# Patient Record
Sex: Male | Born: 1950 | Race: Black or African American | Hispanic: No | Smoking: Current every day smoker
Health system: Southern US, Community
[De-identification: ages and names within clinical notes are randomized; demographics above are authoritative.]

## PROBLEM LIST (undated history)

## (undated) DIAGNOSIS — K219 Gastro-esophageal reflux disease without esophagitis: Secondary | ICD-10-CM

## (undated) DIAGNOSIS — M1990 Unspecified osteoarthritis, unspecified site: Secondary | ICD-10-CM

## (undated) DIAGNOSIS — D649 Anemia, unspecified: Secondary | ICD-10-CM

## (undated) DIAGNOSIS — I10 Essential (primary) hypertension: Secondary | ICD-10-CM

## (undated) DIAGNOSIS — I739 Peripheral vascular disease, unspecified: Secondary | ICD-10-CM

## (undated) DIAGNOSIS — R0600 Dyspnea, unspecified: Secondary | ICD-10-CM

## (undated) DIAGNOSIS — I429 Cardiomyopathy, unspecified: Secondary | ICD-10-CM

## (undated) DIAGNOSIS — M25559 Pain in unspecified hip: Secondary | ICD-10-CM

## (undated) DIAGNOSIS — N429 Disorder of prostate, unspecified: Secondary | ICD-10-CM

## (undated) HISTORY — PX: ABDOMINAL SURGERY: SHX537

## (undated) HISTORY — PX: LUNG SURGERY: SHX703

## (undated) HISTORY — PX: HERNIA REPAIR: SHX51

## (undated) HISTORY — PX: VASCULAR SURGERY: SHX849

---

## 2000-05-08 ENCOUNTER — Emergency Department (HOSPITAL_COMMUNITY): Admission: EM | Admit: 2000-05-08 | Discharge: 2000-05-08 | Payer: Self-pay | Admitting: *Deleted

## 2000-09-10 ENCOUNTER — Ambulatory Visit (HOSPITAL_COMMUNITY): Admission: RE | Admit: 2000-09-10 | Discharge: 2000-09-10 | Payer: Self-pay | Admitting: Family Medicine

## 2000-09-10 ENCOUNTER — Encounter: Payer: Self-pay | Admitting: Family Medicine

## 2001-02-26 ENCOUNTER — Ambulatory Visit (HOSPITAL_COMMUNITY): Admission: RE | Admit: 2001-02-26 | Discharge: 2001-02-26 | Payer: Self-pay | Admitting: Family Medicine

## 2001-02-26 ENCOUNTER — Encounter: Payer: Self-pay | Admitting: Family Medicine

## 2001-05-15 ENCOUNTER — Encounter: Payer: Self-pay | Admitting: Family Medicine

## 2001-05-15 ENCOUNTER — Ambulatory Visit (HOSPITAL_COMMUNITY): Admission: RE | Admit: 2001-05-15 | Discharge: 2001-05-15 | Payer: Self-pay | Admitting: Family Medicine

## 2001-10-23 ENCOUNTER — Emergency Department (HOSPITAL_COMMUNITY): Admission: EM | Admit: 2001-10-23 | Discharge: 2001-10-23 | Payer: Self-pay | Admitting: Internal Medicine

## 2002-10-22 ENCOUNTER — Encounter: Payer: Self-pay | Admitting: Family Medicine

## 2002-10-22 ENCOUNTER — Ambulatory Visit (HOSPITAL_COMMUNITY): Admission: RE | Admit: 2002-10-22 | Discharge: 2002-10-22 | Payer: Self-pay | Admitting: Family Medicine

## 2003-07-12 ENCOUNTER — Emergency Department (HOSPITAL_COMMUNITY): Admission: EM | Admit: 2003-07-12 | Discharge: 2003-07-12 | Payer: Self-pay | Admitting: Emergency Medicine

## 2004-01-20 ENCOUNTER — Ambulatory Visit (HOSPITAL_COMMUNITY): Admission: RE | Admit: 2004-01-20 | Discharge: 2004-01-20 | Payer: Self-pay | Admitting: Pulmonary Disease

## 2004-08-29 ENCOUNTER — Ambulatory Visit (HOSPITAL_COMMUNITY): Admission: RE | Admit: 2004-08-29 | Discharge: 2004-08-29 | Payer: Self-pay | Admitting: General Surgery

## 2005-01-25 ENCOUNTER — Encounter (INDEPENDENT_AMBULATORY_CARE_PROVIDER_SITE_OTHER): Payer: Self-pay | Admitting: Family Medicine

## 2005-01-30 ENCOUNTER — Ambulatory Visit (HOSPITAL_COMMUNITY): Admission: RE | Admit: 2005-01-30 | Discharge: 2005-01-30 | Payer: Self-pay | Admitting: Family Medicine

## 2005-12-20 ENCOUNTER — Ambulatory Visit: Payer: Self-pay | Admitting: Family Medicine

## 2006-01-08 ENCOUNTER — Encounter: Payer: Self-pay | Admitting: Family Medicine

## 2006-01-08 DIAGNOSIS — B1710 Acute hepatitis C without hepatic coma: Secondary | ICD-10-CM | POA: Insufficient documentation

## 2006-01-08 DIAGNOSIS — M1990 Unspecified osteoarthritis, unspecified site: Secondary | ICD-10-CM | POA: Insufficient documentation

## 2006-01-08 DIAGNOSIS — K219 Gastro-esophageal reflux disease without esophagitis: Secondary | ICD-10-CM | POA: Insufficient documentation

## 2006-01-08 DIAGNOSIS — G43909 Migraine, unspecified, not intractable, without status migrainosus: Secondary | ICD-10-CM | POA: Insufficient documentation

## 2006-01-08 DIAGNOSIS — N400 Enlarged prostate without lower urinary tract symptoms: Secondary | ICD-10-CM | POA: Insufficient documentation

## 2006-01-08 DIAGNOSIS — F17200 Nicotine dependence, unspecified, uncomplicated: Secondary | ICD-10-CM | POA: Insufficient documentation

## 2006-01-08 DIAGNOSIS — J309 Allergic rhinitis, unspecified: Secondary | ICD-10-CM | POA: Insufficient documentation

## 2006-01-25 ENCOUNTER — Ambulatory Visit: Payer: Self-pay | Admitting: Family Medicine

## 2006-01-28 ENCOUNTER — Encounter (INDEPENDENT_AMBULATORY_CARE_PROVIDER_SITE_OTHER): Payer: Self-pay | Admitting: Family Medicine

## 2006-01-28 ENCOUNTER — Ambulatory Visit (HOSPITAL_COMMUNITY): Admission: RE | Admit: 2006-01-28 | Discharge: 2006-01-28 | Payer: Self-pay | Admitting: Family Medicine

## 2006-01-28 ENCOUNTER — Encounter: Payer: Self-pay | Admitting: Orthopedic Surgery

## 2006-01-28 LAB — CONVERTED CEMR LAB
Alkaline Phosphatase: 63 units/L (ref 39–117)
Basophils Absolute: 0 10*3/uL (ref 0.0–0.1)
Basophils Relative: 0 % (ref 0–1)
CO2: 28 meq/L (ref 19–32)
Chloride: 101 meq/L (ref 96–112)
Cholesterol: 137 mg/dL (ref 0–200)
Creatinine, Ser: 1.1 mg/dL (ref 0.40–1.50)
Eosinophils Absolute: 0.1 10*3/uL (ref 0.0–0.7)
HDL: 65 mg/dL (ref >39)
LDL Cholesterol: 59 mg/dL (ref 0–99)
MCV: 85.4 fL (ref 78.0–100.0)
Monocytes Absolute: 1.1 10*3/uL — ABNORMAL HIGH (ref 0.2–0.7)
Platelets: 345 10*3/uL (ref 150–400)
RBC: 4.92 M/uL (ref 4.22–5.81)
Sodium: 140 meq/L (ref 135–145)
TSH: 0.315 microintl units/mL — ABNORMAL LOW (ref 0.350–5.50)
Total Protein: 7.6 g/dL (ref 6.0–8.3)
Triglycerides: 63 mg/dL (ref <150)
WBC: 6.9 10*3/uL (ref 4.0–10.5)

## 2006-01-30 ENCOUNTER — Encounter (INDEPENDENT_AMBULATORY_CARE_PROVIDER_SITE_OTHER): Payer: Self-pay | Admitting: Family Medicine

## 2006-01-30 ENCOUNTER — Ambulatory Visit (HOSPITAL_COMMUNITY): Admission: RE | Admit: 2006-01-30 | Discharge: 2006-01-30 | Payer: Self-pay | Admitting: Family Medicine

## 2006-02-04 ENCOUNTER — Encounter (INDEPENDENT_AMBULATORY_CARE_PROVIDER_SITE_OTHER): Payer: Self-pay | Admitting: Family Medicine

## 2006-02-15 ENCOUNTER — Encounter (INDEPENDENT_AMBULATORY_CARE_PROVIDER_SITE_OTHER): Payer: Self-pay | Admitting: Family Medicine

## 2006-02-18 ENCOUNTER — Ambulatory Visit: Payer: Self-pay | Admitting: Family Medicine

## 2006-02-18 DIAGNOSIS — M1610 Unilateral primary osteoarthritis, unspecified hip: Secondary | ICD-10-CM | POA: Insufficient documentation

## 2006-02-18 DIAGNOSIS — M169 Osteoarthritis of hip, unspecified: Secondary | ICD-10-CM | POA: Insufficient documentation

## 2006-02-19 ENCOUNTER — Encounter (INDEPENDENT_AMBULATORY_CARE_PROVIDER_SITE_OTHER): Payer: Self-pay | Admitting: Family Medicine

## 2006-02-27 ENCOUNTER — Telehealth (INDEPENDENT_AMBULATORY_CARE_PROVIDER_SITE_OTHER): Payer: Self-pay | Admitting: Family Medicine

## 2006-03-18 ENCOUNTER — Ambulatory Visit: Payer: Self-pay | Admitting: Orthopedic Surgery

## 2006-04-26 ENCOUNTER — Encounter (INDEPENDENT_AMBULATORY_CARE_PROVIDER_SITE_OTHER): Payer: Self-pay | Admitting: Family Medicine

## 2006-05-06 ENCOUNTER — Ambulatory Visit: Payer: Self-pay | Admitting: Family Medicine

## 2006-05-07 ENCOUNTER — Encounter (INDEPENDENT_AMBULATORY_CARE_PROVIDER_SITE_OTHER): Payer: Self-pay | Admitting: Family Medicine

## 2006-05-07 LAB — CONVERTED CEMR LAB
Amphetamine Screen, Ur: NEGATIVE
Benzodiazepines.: NEGATIVE
Cocaine Metabolites: NEGATIVE
Creatinine,U: 178.7 mg/dL
Opiate Screen, Urine: NEGATIVE
Phencyclidine (PCP): NEGATIVE
Propoxyphene: NEGATIVE
T3, Free: 2.9 pg/mL (ref 2.3–4.2)
TSH: 0.395 microintl units/mL (ref 0.350–5.50)

## 2006-05-20 ENCOUNTER — Ambulatory Visit: Payer: Self-pay | Admitting: Family Medicine

## 2006-05-21 ENCOUNTER — Encounter (INDEPENDENT_AMBULATORY_CARE_PROVIDER_SITE_OTHER): Payer: Self-pay | Admitting: Family Medicine

## 2006-06-11 ENCOUNTER — Ambulatory Visit: Payer: Self-pay | Admitting: Family Medicine

## 2006-07-11 ENCOUNTER — Encounter (INDEPENDENT_AMBULATORY_CARE_PROVIDER_SITE_OTHER): Payer: Self-pay | Admitting: Family Medicine

## 2006-07-12 ENCOUNTER — Ambulatory Visit: Payer: Self-pay | Admitting: Family Medicine

## 2006-07-12 ENCOUNTER — Telehealth (INDEPENDENT_AMBULATORY_CARE_PROVIDER_SITE_OTHER): Payer: Self-pay | Admitting: *Deleted

## 2006-07-13 ENCOUNTER — Encounter (INDEPENDENT_AMBULATORY_CARE_PROVIDER_SITE_OTHER): Payer: Self-pay | Admitting: Family Medicine

## 2006-07-14 LAB — CONVERTED CEMR LAB
Amphetamine Screen, Ur: NEGATIVE
Barbiturate Quant, Ur: NEGATIVE
Benzodiazepines.: NEGATIVE
Methadone: NEGATIVE

## 2006-07-15 ENCOUNTER — Telehealth (INDEPENDENT_AMBULATORY_CARE_PROVIDER_SITE_OTHER): Payer: Self-pay | Admitting: *Deleted

## 2006-08-16 ENCOUNTER — Encounter: Payer: Self-pay | Admitting: Internal Medicine

## 2006-08-16 ENCOUNTER — Ambulatory Visit: Payer: Self-pay | Admitting: Internal Medicine

## 2006-08-16 ENCOUNTER — Ambulatory Visit (HOSPITAL_COMMUNITY): Admission: RE | Admit: 2006-08-16 | Discharge: 2006-08-16 | Payer: Self-pay | Admitting: Internal Medicine

## 2006-10-01 ENCOUNTER — Ambulatory Visit (HOSPITAL_COMMUNITY): Admission: RE | Admit: 2006-10-01 | Discharge: 2006-10-01 | Payer: Self-pay | Admitting: Pulmonary Disease

## 2006-10-09 ENCOUNTER — Ambulatory Visit: Payer: Self-pay | Admitting: Family Medicine

## 2006-11-05 ENCOUNTER — Telehealth (INDEPENDENT_AMBULATORY_CARE_PROVIDER_SITE_OTHER): Payer: Self-pay | Admitting: Family Medicine

## 2006-11-07 ENCOUNTER — Ambulatory Visit: Payer: Self-pay | Admitting: Family Medicine

## 2006-11-08 ENCOUNTER — Encounter (INDEPENDENT_AMBULATORY_CARE_PROVIDER_SITE_OTHER): Payer: Self-pay | Admitting: Family Medicine

## 2006-11-14 ENCOUNTER — Telehealth (INDEPENDENT_AMBULATORY_CARE_PROVIDER_SITE_OTHER): Payer: Self-pay | Admitting: *Deleted

## 2007-03-04 ENCOUNTER — Ambulatory Visit: Payer: Self-pay | Admitting: Family Medicine

## 2007-03-04 ENCOUNTER — Telehealth (INDEPENDENT_AMBULATORY_CARE_PROVIDER_SITE_OTHER): Payer: Self-pay | Admitting: *Deleted

## 2007-03-04 LAB — CONVERTED CEMR LAB

## 2007-03-05 ENCOUNTER — Encounter (INDEPENDENT_AMBULATORY_CARE_PROVIDER_SITE_OTHER): Payer: Self-pay | Admitting: Family Medicine

## 2007-03-10 ENCOUNTER — Telehealth (INDEPENDENT_AMBULATORY_CARE_PROVIDER_SITE_OTHER): Payer: Self-pay | Admitting: *Deleted

## 2007-03-10 LAB — CONVERTED CEMR LAB
ALT: 16 units/L (ref 0–53)
BUN: 12 mg/dL (ref 6–23)
Chloride: 102 meq/L (ref 96–112)
Creatinine, Ser: 0.82 mg/dL (ref 0.40–1.50)
Glucose, Bld: 83 mg/dL (ref 70–99)
HDL: 64 mg/dL (ref >39)
LDL Cholesterol: 72 mg/dL (ref 0–99)
TSH: 0.154 microintl units/mL — ABNORMAL LOW (ref 0.350–5.50)
Total Bilirubin: 0.5 mg/dL (ref 0.3–1.2)
Total Protein: 7.5 g/dL (ref 6.0–8.3)

## 2007-03-11 ENCOUNTER — Encounter (INDEPENDENT_AMBULATORY_CARE_PROVIDER_SITE_OTHER): Payer: Self-pay | Admitting: Family Medicine

## 2007-03-14 ENCOUNTER — Ambulatory Visit: Payer: Self-pay | Admitting: Family Medicine

## 2007-03-14 ENCOUNTER — Telehealth (INDEPENDENT_AMBULATORY_CARE_PROVIDER_SITE_OTHER): Payer: Self-pay | Admitting: *Deleted

## 2007-03-15 ENCOUNTER — Encounter (INDEPENDENT_AMBULATORY_CARE_PROVIDER_SITE_OTHER): Payer: Self-pay | Admitting: Family Medicine

## 2007-03-17 ENCOUNTER — Telehealth (INDEPENDENT_AMBULATORY_CARE_PROVIDER_SITE_OTHER): Payer: Self-pay | Admitting: *Deleted

## 2007-03-18 ENCOUNTER — Ambulatory Visit (HOSPITAL_COMMUNITY): Admission: RE | Admit: 2007-03-18 | Discharge: 2007-03-18 | Payer: Self-pay | Admitting: Family Medicine

## 2007-03-20 ENCOUNTER — Encounter (INDEPENDENT_AMBULATORY_CARE_PROVIDER_SITE_OTHER): Payer: Self-pay | Admitting: Family Medicine

## 2007-03-20 ENCOUNTER — Telehealth (INDEPENDENT_AMBULATORY_CARE_PROVIDER_SITE_OTHER): Payer: Self-pay | Admitting: *Deleted

## 2007-03-21 ENCOUNTER — Telehealth (INDEPENDENT_AMBULATORY_CARE_PROVIDER_SITE_OTHER): Payer: Self-pay | Admitting: *Deleted

## 2007-03-21 ENCOUNTER — Encounter (INDEPENDENT_AMBULATORY_CARE_PROVIDER_SITE_OTHER): Payer: Self-pay | Admitting: Family Medicine

## 2007-04-01 ENCOUNTER — Ambulatory Visit: Payer: Self-pay | Admitting: Family Medicine

## 2007-04-02 ENCOUNTER — Ambulatory Visit (HOSPITAL_COMMUNITY): Admission: RE | Admit: 2007-04-02 | Discharge: 2007-04-02 | Payer: Self-pay | Admitting: Family Medicine

## 2007-04-02 ENCOUNTER — Telehealth (INDEPENDENT_AMBULATORY_CARE_PROVIDER_SITE_OTHER): Payer: Self-pay | Admitting: Family Medicine

## 2007-04-02 ENCOUNTER — Encounter (INDEPENDENT_AMBULATORY_CARE_PROVIDER_SITE_OTHER): Payer: Self-pay | Admitting: Family Medicine

## 2007-04-02 ENCOUNTER — Encounter: Payer: Self-pay | Admitting: Orthopedic Surgery

## 2007-04-09 ENCOUNTER — Encounter (HOSPITAL_COMMUNITY): Admission: RE | Admit: 2007-04-09 | Discharge: 2007-05-09 | Payer: Self-pay | Admitting: Endocrinology

## 2007-04-10 ENCOUNTER — Encounter (INDEPENDENT_AMBULATORY_CARE_PROVIDER_SITE_OTHER): Payer: Self-pay | Admitting: Family Medicine

## 2007-04-17 ENCOUNTER — Encounter (INDEPENDENT_AMBULATORY_CARE_PROVIDER_SITE_OTHER): Payer: Self-pay | Admitting: Family Medicine

## 2007-04-23 ENCOUNTER — Ambulatory Visit: Payer: Self-pay | Admitting: Orthopedic Surgery

## 2007-06-03 ENCOUNTER — Telehealth (INDEPENDENT_AMBULATORY_CARE_PROVIDER_SITE_OTHER): Payer: Self-pay | Admitting: *Deleted

## 2007-06-04 ENCOUNTER — Telehealth (INDEPENDENT_AMBULATORY_CARE_PROVIDER_SITE_OTHER): Payer: Self-pay | Admitting: *Deleted

## 2007-06-19 ENCOUNTER — Ambulatory Visit: Payer: Self-pay | Admitting: Family Medicine

## 2007-06-19 DIAGNOSIS — R636 Underweight: Secondary | ICD-10-CM | POA: Insufficient documentation

## 2007-06-19 DIAGNOSIS — A150 Tuberculosis of lung: Secondary | ICD-10-CM | POA: Insufficient documentation

## 2007-06-20 ENCOUNTER — Encounter (INDEPENDENT_AMBULATORY_CARE_PROVIDER_SITE_OTHER): Payer: Self-pay | Admitting: Family Medicine

## 2007-06-23 LAB — CONVERTED CEMR LAB
HCV Ab: REACTIVE — AB
Hepatitis B Surface Ag: NEGATIVE

## 2007-07-02 ENCOUNTER — Ambulatory Visit (HOSPITAL_COMMUNITY): Admission: RE | Admit: 2007-07-02 | Discharge: 2007-07-02 | Payer: Self-pay | Admitting: Pulmonary Disease

## 2007-07-17 ENCOUNTER — Ambulatory Visit: Payer: Self-pay | Admitting: Family Medicine

## 2007-07-24 ENCOUNTER — Telehealth (INDEPENDENT_AMBULATORY_CARE_PROVIDER_SITE_OTHER): Payer: Self-pay | Admitting: *Deleted

## 2007-09-09 ENCOUNTER — Encounter: Admission: RE | Admit: 2007-09-09 | Discharge: 2007-09-09 | Payer: Self-pay | Admitting: Orthopedic Surgery

## 2007-09-25 ENCOUNTER — Ambulatory Visit: Payer: Self-pay | Admitting: Family Medicine

## 2007-09-25 DIAGNOSIS — F528 Other sexual dysfunction not due to a substance or known physiological condition: Secondary | ICD-10-CM | POA: Insufficient documentation

## 2007-10-23 ENCOUNTER — Ambulatory Visit: Payer: Self-pay | Admitting: Family Medicine

## 2007-10-23 LAB — CONVERTED CEMR LAB
Bilirubin Urine: NEGATIVE
Ketones, urine, test strip: NEGATIVE
Protein, U semiquant: NEGATIVE
Specific Gravity, Urine: 1.02
Urobilinogen, UA: 0.2
pH: 6

## 2007-10-24 ENCOUNTER — Encounter (INDEPENDENT_AMBULATORY_CARE_PROVIDER_SITE_OTHER): Payer: Self-pay | Admitting: Family Medicine

## 2007-10-24 LAB — CONVERTED CEMR LAB: GC Probe Amp, Urine: NEGATIVE

## 2007-11-26 ENCOUNTER — Telehealth (INDEPENDENT_AMBULATORY_CARE_PROVIDER_SITE_OTHER): Payer: Self-pay | Admitting: *Deleted

## 2008-01-29 ENCOUNTER — Telehealth (INDEPENDENT_AMBULATORY_CARE_PROVIDER_SITE_OTHER): Payer: Self-pay | Admitting: Family Medicine

## 2008-02-05 ENCOUNTER — Ambulatory Visit: Payer: Self-pay | Admitting: Family Medicine

## 2008-02-06 ENCOUNTER — Encounter (INDEPENDENT_AMBULATORY_CARE_PROVIDER_SITE_OTHER): Payer: Self-pay | Admitting: Family Medicine

## 2008-02-08 LAB — CONVERTED CEMR LAB
Benzodiazepines.: NEGATIVE
Cocaine Metabolites: NEGATIVE
Marijuana Metabolite: NEGATIVE
Opiate Screen, Urine: NEGATIVE
Phencyclidine (PCP): NEGATIVE

## 2008-03-12 ENCOUNTER — Telehealth (INDEPENDENT_AMBULATORY_CARE_PROVIDER_SITE_OTHER): Payer: Self-pay | Admitting: Family Medicine

## 2008-03-29 ENCOUNTER — Encounter (INDEPENDENT_AMBULATORY_CARE_PROVIDER_SITE_OTHER): Payer: Self-pay | Admitting: Family Medicine

## 2008-04-13 ENCOUNTER — Ambulatory Visit: Payer: Self-pay | Admitting: Family Medicine

## 2008-05-11 ENCOUNTER — Ambulatory Visit: Payer: Self-pay | Admitting: Family Medicine

## 2008-05-12 ENCOUNTER — Encounter (INDEPENDENT_AMBULATORY_CARE_PROVIDER_SITE_OTHER): Payer: Self-pay | Admitting: Family Medicine

## 2008-05-13 LAB — CONVERTED CEMR LAB
AST: 28 units/L (ref 0–37)
BUN: 9 mg/dL (ref 6–23)
Basophils Absolute: 0.1 10*3/uL (ref 0.0–0.1)
Basophils Relative: 1 % (ref 0–1)
Calcium: 9.6 mg/dL (ref 8.4–10.5)
Chloride: 108 meq/L (ref 96–112)
Eosinophils Absolute: 0.2 10*3/uL (ref 0.0–0.7)
Hemoglobin: 12.3 g/dL — ABNORMAL LOW (ref 13.0–17.0)
Lymphs Abs: 2.3 10*3/uL (ref 0.7–4.0)
MCHC: 32.4 g/dL (ref 30.0–36.0)
MCV: 85 fL (ref 78.0–100.0)
Neutro Abs: 4.5 10*3/uL (ref 1.7–7.7)
Neutrophils Relative %: 58 % (ref 43–77)
Potassium: 4.4 meq/L (ref 3.5–5.3)
RDW: 14.2 % (ref 11.5–15.5)
Total Bilirubin: 0.4 mg/dL (ref 0.3–1.2)
WBC: 7.7 10*3/uL (ref 4.0–10.5)

## 2008-05-18 ENCOUNTER — Telehealth (INDEPENDENT_AMBULATORY_CARE_PROVIDER_SITE_OTHER): Payer: Self-pay | Admitting: *Deleted

## 2008-05-18 DIAGNOSIS — D649 Anemia, unspecified: Secondary | ICD-10-CM | POA: Insufficient documentation

## 2008-05-20 ENCOUNTER — Encounter (INDEPENDENT_AMBULATORY_CARE_PROVIDER_SITE_OTHER): Payer: Self-pay | Admitting: Family Medicine

## 2008-05-21 LAB — CONVERTED CEMR LAB
Iron: 135 ug/dL (ref 42–165)
Retic Ct Pct: 0.8 % (ref 0.4–3.1)
UIBC: 170 ug/dL

## 2008-05-24 ENCOUNTER — Encounter (INDEPENDENT_AMBULATORY_CARE_PROVIDER_SITE_OTHER): Payer: Self-pay | Admitting: Family Medicine

## 2008-06-15 ENCOUNTER — Telehealth (INDEPENDENT_AMBULATORY_CARE_PROVIDER_SITE_OTHER): Payer: Self-pay | Admitting: Family Medicine

## 2008-07-26 ENCOUNTER — Ambulatory Visit: Payer: Self-pay | Admitting: Family Medicine

## 2008-07-27 ENCOUNTER — Encounter (INDEPENDENT_AMBULATORY_CARE_PROVIDER_SITE_OTHER): Payer: Self-pay | Admitting: *Deleted

## 2008-07-27 LAB — CONVERTED CEMR LAB
Chloride: 109 meq/L (ref 96–112)
Eosinophils Relative: 5 % (ref 0–5)
Glucose, Bld: 108 mg/dL — ABNORMAL HIGH (ref 70–99)
HCT: 37.7 % — ABNORMAL LOW (ref 39.0–52.0)
MCV: 85.7 fL (ref 78.0–100.0)
Monocytes Relative: 8 % (ref 3–12)
Neutro Abs: 3.5 10*3/uL (ref 1.7–7.7)
Platelets: 332 10*3/uL (ref 150–400)
RBC: 4.4 M/uL (ref 4.22–5.81)
RDW: 13.8 % (ref 11.5–15.5)
Sodium: 142 meq/L (ref 135–145)

## 2008-08-26 ENCOUNTER — Ambulatory Visit: Payer: Self-pay | Admitting: Family Medicine

## 2008-09-20 ENCOUNTER — Encounter (INDEPENDENT_AMBULATORY_CARE_PROVIDER_SITE_OTHER): Payer: Self-pay | Admitting: Family Medicine

## 2008-09-20 ENCOUNTER — Telehealth (INDEPENDENT_AMBULATORY_CARE_PROVIDER_SITE_OTHER): Payer: Self-pay | Admitting: *Deleted

## 2009-03-14 ENCOUNTER — Emergency Department (HOSPITAL_COMMUNITY): Admission: EM | Admit: 2009-03-14 | Discharge: 2009-03-14 | Payer: Self-pay | Admitting: Emergency Medicine

## 2009-04-04 ENCOUNTER — Ambulatory Visit (HOSPITAL_COMMUNITY): Admission: RE | Admit: 2009-04-04 | Discharge: 2009-04-04 | Payer: Self-pay | Admitting: Internal Medicine

## 2009-09-02 ENCOUNTER — Ambulatory Visit (HOSPITAL_COMMUNITY): Admission: RE | Admit: 2009-09-02 | Discharge: 2009-09-02 | Payer: Self-pay | Admitting: Internal Medicine

## 2010-03-15 ENCOUNTER — Other Ambulatory Visit: Payer: Self-pay | Admitting: General Surgery

## 2010-03-15 ENCOUNTER — Encounter (HOSPITAL_COMMUNITY): Payer: Medicaid Other

## 2010-03-15 LAB — DIFFERENTIAL
Basophils Absolute: 0 10*3/uL (ref 0.0–0.1)
Neutro Abs: 4.1 10*3/uL (ref 1.7–7.7)

## 2010-03-15 LAB — BASIC METABOLIC PANEL
CO2: 25 mEq/L (ref 19–32)
Chloride: 108 mEq/L (ref 96–112)
GFR calc non Af Amer: >60 mL/min (ref >60)
Potassium: 4.6 mEq/L (ref 3.5–5.1)
Sodium: 141 mEq/L (ref 135–145)

## 2010-03-15 LAB — SURGICAL PCR SCREEN: Staphylococcus aureus: POSITIVE — AB

## 2010-03-15 LAB — CBC
HCT: 37.9 % — ABNORMAL LOW (ref 39.0–52.0)
Hemoglobin: 12.4 g/dL — ABNORMAL LOW (ref 13.0–17.0)
MCV: 83.7 fL (ref 78.0–100.0)
RBC: 4.53 MIL/uL (ref 4.22–5.81)

## 2010-03-21 ENCOUNTER — Ambulatory Visit (HOSPITAL_COMMUNITY)
Admission: RE | Admit: 2010-03-21 | Discharge: 2010-03-21 | Disposition: A | Discharge status: Home / Self Care | Payer: Medicaid Other | Source: Ambulatory Visit | Attending: General Surgery | Admitting: General Surgery

## 2010-03-21 ENCOUNTER — Other Ambulatory Visit: Payer: Self-pay | Admitting: General Surgery

## 2010-03-21 DIAGNOSIS — Z01812 Encounter for preprocedural laboratory examination: Secondary | ICD-10-CM | POA: Insufficient documentation

## 2010-03-21 DIAGNOSIS — N471 Phimosis: Secondary | ICD-10-CM | POA: Insufficient documentation

## 2010-03-21 DIAGNOSIS — Z01818 Encounter for other preprocedural examination: Secondary | ICD-10-CM | POA: Insufficient documentation

## 2010-03-27 NOTE — Op Note (Signed)
  NAME:  James Cervantes, James Cervantes NO.:  [id number]  MEDICAL RECORD NO.:  [id number]           PATIENT TYPE:  O  LOCATION:  DAYP                          FACILITY:  [facility]  PHYSICIAN:  Barbaraann Barthel, M.D. DATE OF BIRTH:  1950-08-29  DATE OF PROCEDURE:  03/21/2010 DATE OF DISCHARGE:                              OPERATIVE REPORT   PREOPERATIVE DIAGNOSIS:  Penile phimosis.  POSTOPERATIVE DIAGNOSIS:  Penile phimosis.  PROCEDURE:  Circumcision.  SPECIMEN:  Foreskin.  WOUND CLASSIFICATION:  Clean.  NOTE:  This is a 60 year old black male who had increasing discomfort around the glans of his penis as he had some paraphimosis and phimosis made withdrawing the foreskin difficult and difficult to clean.  He was referred by the Medical Service for circumcision.  We discussed complications, not limited to but including bleeding and infection and informed consent was obtained.  TECHNIQUE:  The patient was placed in a supine position and after the adequate administration of spinal anesthesia, his genital area was prepped with Betadine solution and draped in usual manner.  A dorsal and ventral slit was carried of the penile foreskin and a circumferential incision was carried out in order to remove the redundant portion of the foreskin.  This was sutured circumferentially around the corona with 4-0 Vicryl after controlling a minimal amount of bleeding with a needle tip Bovie device.  The foreskin was very thickened.  There was minimal amount of bleeding.  After this was done and we checked for hemostasis and small amount of foreskin was then trimmed around the suture edges, we then placed a Xeroform gauze around the corona of the penis and wrapped this with some gauze and then placed a Surgiflex to hold the dressing in place.  Prior to closure all sponge, needle and instrument counts were found to be correct.  Estimated blood loss was minimal.  The patient received 600 mL of  crystalloids intraoperatively.  No drains were placed.  There were no complications.     Barbaraann Barthel, M.D.     WB/MEDQ  D:  03/21/2010  T:  03/21/2010  Job:  [id number]  cc:   Tesfaye D. Felecia Shelling, MD Fax: [phone]  Electronically Signed by Barbaraann Barthel M.D. on 03/27/2010 11:54:51 AM

## 2010-05-16 NOTE — Procedures (Signed)
NAME:  James Cervantes, James Cervantes                  ACCOUNT NO.:  [id number]   MEDICAL RECORD NO.:  [id number]          PATIENT TYPE:  OUT   LOCATION:  RESP                          FACILITY:  [facility]   PHYSICIAN:  Edward L. Juanetta Gosling, M.D.DATE OF BIRTH:  1950-07-04   DATE OF PROCEDURE:  DATE OF DISCHARGE:  10/01/2006                            PULMONARY FUNCTION TEST   1. Spirometry shows a severe ventilatory defect with an FEV-1 of about      900 mL.  This is due to airflow obstruction.  His blood gas is      normal.  There is no significant bronchodilator improvement and in      fact, bronchodilator worsens his pulmonary function.  Lung function      is approximately the same as January 30, 2006.      Edward L. Juanetta Gosling, M.D.  Electronically Signed     ELH/MEDQ  D:  10/02/2006  T:  10/03/2006  Job:  [id number]   cc:   Franchot Heidelberg, M.D.

## 2010-05-16 NOTE — Op Note (Signed)
NAME:  James Cervantes, James Cervantes                  ACCOUNT NO.:  [id number]   MEDICAL RECORD NO.:  [id number]          PATIENT TYPE:  AMB   LOCATION:  DAY                           FACILITY:  [facility]   PHYSICIAN:  R. Roetta Sessions, M.D. DATE OF BIRTH:  Oct 03, 1950   DATE OF PROCEDURE:  08/16/2006  DATE OF DISCHARGE:                               OPERATIVE REPORT   PROCEDURE:  Colonoscopy with biopsy.   INDICATIONS FOR PROCEDURE:  Patient is a 60 year old African American  male sent over at the courtesy of Dr. Franchot Heidelberg for colorectal  cancer screening.  He is devoid of any lower GI tract symptoms.  Has  never had his lower GI tract imaged.  No family history of colorectal  neoplasia.  Colonoscopy is now being done as a screening maneuver.  This  approach has been discussed with patient at length.  Potential risks,  benefits and alternatives have been reviewed and questions answered.  He  is agreeable.  Please see documentation in the medical record.   PROCEDURE NOTE:  Oxygen saturation, blood pressure, pulse, and  respirations were monitored the entire procedure.  Conscious sedation  with Versed 4 mg IV, Demerol 100 mg IV in divided doses.  Instrument was  the Pentax video chip system.   FINDINGS:  Digital rectal exam revealed no abnormalities.  The prep was  excellent colon:  Colonic mucosa was surveyed from the rectosigmoid  junction through the left, transverse, right colon to appendiceal  orifice, ileocecal valve, nd cecum.  These structures well seen and  photographed for the record.  From this level scope was withdrawn and  all previously mentioned mucosal surfaces were again seen.  The patient  had a cluster of four diminutive polyps in the rectosigmoid mucosa.  They appeared to be hyperplastic.  Cold biopsy/removed.  Remainder of  colonic mucosa appeared normal.  Scope was pulled down into the rectum  where a thorough examination of the rectal mucosa on retroflexion and  view of the  anal verge demonstrated no abnormalities.  The patient  tolerated the procedure well and was reactive after the endoscopy.   IMPRESSION:  1. Normal rectum.  2. Diminutive distal sigmoid polyps, cold biopsy/removed.  The      remainder of the here colonic mucosa appeared normal.   RECOMMENDATIONS:  1. Follow up pathology .  2. Further recommendations to follow.  L in the dictation copy of      Remi Haggard for right      R. Roetta Sessions, M.D.  Electronically Signed     RMR/MEDQ  D:  08/16/2006  T:  08/17/2006  Job:  [id number]   cc:   Franchot Heidelberg, M.D.

## 2010-05-17 ENCOUNTER — Emergency Department (HOSPITAL_COMMUNITY)
Admission: EM | Admit: 2010-05-17 | Discharge: 2010-05-17 | Disposition: A | Discharge status: Home / Self Care | Payer: Medicaid Other | Attending: Emergency Medicine | Admitting: Emergency Medicine

## 2010-05-17 ENCOUNTER — Emergency Department (HOSPITAL_COMMUNITY): Payer: Medicaid Other

## 2010-05-17 DIAGNOSIS — Z79899 Other long term (current) drug therapy: Secondary | ICD-10-CM | POA: Insufficient documentation

## 2010-05-17 DIAGNOSIS — S139XXA Sprain of joints and ligaments of unspecified parts of neck, initial encounter: Secondary | ICD-10-CM | POA: Insufficient documentation

## 2010-05-17 DIAGNOSIS — J45909 Unspecified asthma, uncomplicated: Secondary | ICD-10-CM | POA: Insufficient documentation

## 2010-05-17 DIAGNOSIS — M129 Arthropathy, unspecified: Secondary | ICD-10-CM | POA: Insufficient documentation

## 2010-05-17 DIAGNOSIS — X500XXA Overexertion from strenuous movement or load, initial encounter: Secondary | ICD-10-CM | POA: Insufficient documentation

## 2010-05-17 DIAGNOSIS — Y998 Other external cause status: Secondary | ICD-10-CM | POA: Insufficient documentation

## 2010-05-19 NOTE — Procedures (Signed)
NAME:  James Cervantes, James Cervantes                  ACCOUNT NO.:  [id number]   MEDICAL RECORD NO.:  [id number]          PATIENT TYPE:  OUT   LOCATION:  RESP                          FACILITY:  [facility]   PHYSICIAN:  Edward L. Juanetta Gosling, M.D.DATE OF BIRTH:  1950/08/15   DATE OF PROCEDURE:  01/26/2004  DATE OF DISCHARGE:  01/20/2004                              PULMONARY FUNCTION TEST   IMPRESSION:  1.  Spirometry shows a severe ventilatory defect without definite airflow      obstruction.  2.  There is no significant bronchodilator effect.      ELH/MEDQ  D:  01/26/2004  T:  01/26/2004  Job:  [id number]   cc:   Annia Friendly. Loleta Chance, MD  [address]  [place]  [place] [address]  Fax: [phone]

## 2010-05-19 NOTE — Op Note (Signed)
NAME:  James Cervantes, James Cervantes                  ACCOUNT NO.:  [id number]   MEDICAL RECORD NO.:  [id number]          PATIENT TYPE:  AMB   LOCATION:  DAY                           FACILITY:  [facility]   PHYSICIAN:  Dirk Dress. Katrinka Blazing, M.D.   DATE OF BIRTH:  [birth year]   DATE OF PROCEDURE:  08/29/2004  DATE OF DISCHARGE:                                 OPERATIVE REPORT   PREOPERATIVE DIAGNOSIS:  Right inguinal hernia.   POSTOPERATIVE DIAGNOSIS:  Right direct inguinal hernia.   PROCEDURE:  Right inguinal hernia repair with mesh graft.   SURGEON:  Dr. Katrinka Blazing.   DESCRIPTION:  Under spinal anesthesia, the right inguinal area and genitalia  were prepped and draped in the sterile field. Curvilinear incision was made.  Incision was extended down to the aponeurosis. Aponeurosis was opened in the  line with its fibers. Cord was mobilized from the inguinal floor.  Exploration of the cord did not reveal any indirect components. There was no  indirect sac. No significant lipoma. There was severe attenuation of the  inguinal floor with diffuse bulging through the inguinal floor. A piece of  Marlex mesh was chosen, and primary repair of the inguinal floor was carried  out. This was done with suturing of the mesh starting at the pubic tubercle  and extending down Cooper's ligament, transitioning at the femoral condyle  and extending along the ileopubic track. Superiorly, the repair was started  at the pubic tubercle and extended along the conjoined tendon. The mesh was  sutured around the cord laterally. The repair was done with running 0  Prolene. The cord was placed in anatomic position, and the aponeurosis was  closed with 3-0 Monocryl. Subcutaneous tissue was closed with 3-0 Monocryl.  Skin was closed with 4-0 Vicryl. Local infiltration with 0.5% Marcaine with  epinephrine was carried out using 30 cc. Sterile dressing was placed. The  patient tolerated the procedure well. He was awakened from anesthesia  uneventfully, transferred to a bed, and taken to the post-anesthesia care  unit for further monitoring.      Dirk Dress. Katrinka Blazing, M.D.  Electronically Signed     LCS/MEDQ  D:  08/29/2004  T:  08/29/2004  Job:  [id number]

## 2010-05-19 NOTE — Procedures (Signed)
NAME:  James Cervantes, James Cervantes                  ACCOUNT NO.:  [id number]   MEDICAL RECORD NO.:  [id number]          PATIENT TYPE:  OUT   LOCATION:  RESP                          FACILITY:  [facility]   PHYSICIAN:  Edward L. Juanetta Gosling, M.D.DATE OF BIRTH:  1950/07/01   DATE OF PROCEDURE:  DATE OF DISCHARGE:  01/30/2006                            PULMONARY FUNCTION TEST   1. Spirometry shows severe ventilatory defect without definite airflow      obstruction.  Lung volumes were not done.  2. Arterial blood gas is normal.  3. There is significant bronchodilator improvement.      Edward L. Juanetta Gosling, M.D.  Electronically Signed     ELH/MEDQ  D:  02/11/2006  T:  02/11/2006  Job:  [id number]
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
## 2010-10-12 LAB — BLOOD GAS, ARTERIAL
Acid-Base Excess: 0.6
FIO2: 0.21
pCO2 arterial: 44.4
pH, Arterial: 7.373
pO2, Arterial: 87.8

## 2011-01-14 ENCOUNTER — Emergency Department (HOSPITAL_COMMUNITY)
Admission: EM | Admit: 2011-01-14 | Discharge: 2011-01-14 | Disposition: A | Discharge status: Home / Self Care | Payer: Medicaid Other | Attending: Emergency Medicine | Admitting: Emergency Medicine

## 2011-01-14 ENCOUNTER — Encounter (HOSPITAL_COMMUNITY): Payer: Self-pay | Admitting: *Deleted

## 2011-01-14 DIAGNOSIS — J438 Other emphysema: Secondary | ICD-10-CM | POA: Insufficient documentation

## 2011-01-14 DIAGNOSIS — Y92009 Unspecified place in unspecified non-institutional (private) residence as the place of occurrence of the external cause: Secondary | ICD-10-CM | POA: Insufficient documentation

## 2011-01-14 DIAGNOSIS — X500XXA Overexertion from strenuous movement or load, initial encounter: Secondary | ICD-10-CM | POA: Insufficient documentation

## 2011-01-14 DIAGNOSIS — M79609 Pain in unspecified limb: Secondary | ICD-10-CM | POA: Insufficient documentation

## 2011-01-14 DIAGNOSIS — M25559 Pain in unspecified hip: Secondary | ICD-10-CM | POA: Insufficient documentation

## 2011-01-14 DIAGNOSIS — M25551 Pain in right hip: Secondary | ICD-10-CM

## 2011-01-14 HISTORY — DX: Pain in unspecified hip: M25.559

## 2011-01-14 HISTORY — DX: Disorder of prostate, unspecified: N42.9

## 2011-01-14 MED ORDER — OXYCODONE-ACETAMINOPHEN 5-325 MG PO TABS: 1.0000 | ORAL_TABLET | Freq: Once | ORAL | Status: DC

## 2011-01-14 MED ORDER — HYDROCODONE-ACETAMINOPHEN 5-325 MG PO TABS: 1.0000 | ORAL_TABLET | Freq: Four times a day (QID) | ORAL | Status: AC | PRN

## 2011-01-14 MED ORDER — METHOCARBAMOL 500 MG PO TABS
1500.0000 mg | ORAL_TABLET | Freq: Once | ORAL | Status: DC
  Filled 2011-01-14: qty 3

## 2011-01-14 NOTE — ED Notes (Signed)
Pt c/o chronic right hip pain, is in process of seeing pain clinic but pain clinic does not have pt records and is unable to see him, pt is out of pain medication, pt normally takes percocet's and needs another percocet. Denies any new injury

## 2011-01-14 NOTE — ED Notes (Signed)
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 Pt states needs Rx refill for oxycodone, "Dr Felecia Shelling let his license expire and he can't write this anymore and pain clinic can't see me for 2 more weeks." Pt says 'needs medication so I can function".

## 2011-01-14 NOTE — ED Notes (Signed)
Pt not in room to receive discharge papers. Pt was growing very impatient and was upset that his refill was not handled in a quicker manner.

## 2011-01-14 NOTE — ED Provider Notes (Signed)
History     CSN: [id number]  Arrival date & time 01/14/11  1201   First MD Initiated Contact with Patient 01/14/11 1250      Chief Complaint  Patient presents with  . Leg Pain    (Consider location/radiation/quality/duration/timing/severity/associated sxs/prior treatment) Patient is a 61 y.o. male presenting with leg pain. The history is provided by the patient. No language interpreter was used.  Leg Pain  The incident occurred yesterday. The incident occurred at home. The injury mechanism was torsion. Pain location: R thumb. The pain is at a severity of 7/10. The pain has been constant since onset. Associated symptoms include loss of motion. He reports no foreign bodies present. The symptoms are aggravated by palpation. He has tried nothing for the symptoms.    Past Medical History  Diagnosis Date  . Asthma   . Emphysema   . Prostate disorder   . Hip pain     Past Surgical History  Procedure Date  . Vascular surgery   . Lung surgery   . Abdominal surgery   . Hernia repair     No family history on file.  History  Substance Use Topics  . Smoking status: Current Everyday Smoker  . Smokeless tobacco: Not on file  . Alcohol Use: No      Review of Systems  All other systems reviewed and are negative.    Allergies  Review of patient's allergies indicates no known allergies.  Home Medications   Current Outpatient Rx  Name Route Sig Dispense Refill  . IPRATROPIUM-ALBUTEROL 18-103 MCG/ACT IN AERO Inhalation Inhale 2 puffs into the lungs every 6 (six) hours as needed. Wheezing    . FLUTICASONE-SALMETEROL 500-50 MCG/DOSE IN AEPB Inhalation Inhale 1 puff into the lungs every 12 (twelve) hours as needed. Shortness of Breath    . OXYCODONE-ACETAMINOPHEN 5-500 MG PO TABS Oral Take 1 tablet by mouth every 4 (four) hours as needed. Pain    . TAMSULOSIN HCL 0.4 MG PO CAPS Oral Take 0.4 mg by mouth at bedtime.      BP 135/78  Pulse 78  Temp 98 F (36.7 C)  Resp 20   Ht 5\' 6"  (1.676 m)  Wt 125 lb (56.7 kg)  BMI 20.18 kg/m2  SpO2 99%  Physical Exam  Nursing note and vitals reviewed. Constitutional: He is oriented to person, place, and time. He appears well-developed and well-nourished. He is cooperative.  HENT:  Head: Normocephalic and atraumatic.  Eyes: EOM are normal.  Neck: Normal range of motion.  Cardiovascular: Normal rate, regular rhythm, normal heart sounds and intact distal pulses.   Pulmonary/Chest: Effort normal and breath sounds normal. No respiratory distress.  Abdominal: Soft. He exhibits no distension. There is no tenderness.  Musculoskeletal: He exhibits tenderness.       Right hip: He exhibits decreased range of motion, decreased strength, tenderness and bony tenderness. He exhibits no swelling, no crepitus, no deformity and no laceration.       Legs: Neurological: He is alert and oriented to person, place, and time.  Skin: Skin is warm and dry.  Psychiatric: He has a normal mood and affect. Judgment normal.    ED Course  Procedures (including critical care time)  Labs Reviewed - No data to display No results found.   No diagnosis found.    MDM          Worthy Rancher, PA 01/14/11 1436  Worthy Rancher, PA 01/20/11 [phone]

## 2011-01-23 NOTE — ED Provider Notes (Signed)
Medical screening examination/treatment/procedure(s) were performed by non-physician practitioner and as supervising physician I was immediately available for consultation/collaboration.   Feige Lowdermilk W Tosca Pletz, MD 01/23/11 1602 

## 2011-06-13 ENCOUNTER — Emergency Department (HOSPITAL_COMMUNITY)
Admission: EM | Admit: 2011-06-13 | Discharge: 2011-06-13 | Disposition: A | Discharge status: Home / Self Care | Payer: Medicaid Other | Attending: Emergency Medicine | Admitting: Emergency Medicine

## 2011-06-13 ENCOUNTER — Encounter (HOSPITAL_COMMUNITY): Payer: Self-pay | Admitting: Emergency Medicine

## 2011-06-13 DIAGNOSIS — M25559 Pain in unspecified hip: Secondary | ICD-10-CM | POA: Insufficient documentation

## 2011-06-13 DIAGNOSIS — M25519 Pain in unspecified shoulder: Secondary | ICD-10-CM | POA: Insufficient documentation

## 2011-06-13 DIAGNOSIS — G8929 Other chronic pain: Secondary | ICD-10-CM

## 2011-06-13 DIAGNOSIS — J438 Other emphysema: Secondary | ICD-10-CM | POA: Insufficient documentation

## 2011-06-13 DIAGNOSIS — F17200 Nicotine dependence, unspecified, uncomplicated: Secondary | ICD-10-CM | POA: Insufficient documentation

## 2011-06-13 DIAGNOSIS — M129 Arthropathy, unspecified: Secondary | ICD-10-CM | POA: Insufficient documentation

## 2011-06-13 HISTORY — DX: Unspecified osteoarthritis, unspecified site: M19.90

## 2011-06-13 MED ORDER — PROMETHAZINE HCL 12.5 MG PO TABS
12.5000 mg | ORAL_TABLET | Freq: Once | ORAL | Status: AC
  Administered 2011-06-13: 12.5 mg via ORAL
  Filled 2011-06-13: qty 1

## 2011-06-13 MED ORDER — HYDROCODONE-ACETAMINOPHEN 5-325 MG PO TABS
2.0000 | ORAL_TABLET | Freq: Once | ORAL | Status: AC
  Administered 2011-06-13: 2 via ORAL
  Filled 2011-06-13: qty 2

## 2011-06-13 MED ORDER — HYDROCODONE-ACETAMINOPHEN 7.5-325 MG PO TABS: ORAL_TABLET | ORAL | Status: DC

## 2011-06-13 NOTE — ED Provider Notes (Signed)
Medical screening examination/treatment/procedure(s) were performed by non-physician practitioner and as supervising physician I was immediately available for consultation/collaboration.   Joya Gaskins, MD 06/13/11 [phone]

## 2011-06-13 NOTE — Discharge Instructions (Signed)
I have included information on the orthopedic clinic at [facility] and [facility]. They may be able to help with your hip replacement and your on-going pain problem.Chronic Pain Chronic pain can be defined as pain that is lasting, off and on, and lasts for 3 to 6 months or longer. Many things cause chronic pain, which can make it difficult to make a discrete diagnosis. There are many treatment options available for chronic pain. However, finding a treatment that works well for you may require trying various approaches until a suitable one is found. CAUSES  In some types of chronic medical conditions, the pain is caused by a normal pain response within the body. A normal pain response helps the body identify illness or injury and prevent further damage from being done. In these cases, the cause of the pain may be identified and treated, even if it may not be cured completely. Examples of chronic conditions which can cause chronic pain include:  Inflammation of the joints (arthritis).   Back pain or neck pain (including bulging or herniated disks).   Migraine headaches.   Cancer.  In some other types of chronic pain syndromes, the pain is caused by an abnormal pain response within the body. An abnormal pain response is present when there is no ongoing cause (or stimulus) for the pain, or when the cause of the pain is arising from the nerves or nervous system itself. Examples of conditions which can cause chronic pain due to an abnormal pain response include:  Fibromyalgia.   Reflex sympathetic dystrophy (RSD).   Neuropathy (when the nerves themselves are damaged, and may cause pain).  DIAGNOSIS  Your caregiver will help diagnose your condition over time. In many cases, the initial focus will be on excluding conditions that could be causing the pain. Depending on your symptoms, your caregiver may order some tests to diagnose your condition. Some of these tests include:  Blood tests.    Computerized X-ray scans (CT scan).   Computerized magnetic scans (MRI).   X-rays.   Ultrasounds.   Nerve conduction studies.   Consultation with other physicians or specialists.  TREATMENT  There are many treatment options for people suffering from chronic pain. Finding a treatment that works well may take time.   You may be referred to a pain management specialist.   You may be put on medication to help with the pain. Unfortunately, some medications (such as opiate medications) may not be very effective in cases where chronic pain is due to abnormal pain responses. Finding the right medications can take some time.   Adjunctive therapies may be used to provide additional relief and improve a patient's quality of life. These therapies include:   Mindfulness meditation.   Acupuncture.   Biofeedback.   Cognitive-behavioral therapy.   In certain cases, surgical interventions may be attempted.  HOME CARE INSTRUCTIONS   Make sure you understand these instructions prior to discharge.   Ask any questions and share any further concerns you have with your caregiver prior to discharge.   Take all medications as directed by your caregiver.   Keep all follow-up appointments.  SEEK MEDICAL CARE IF:   Your pain gets worse.   You develop a new pain that was not present before.   You cannot tolerate any medications prescribed by your caregiver.   You develop new symptoms since your last visit with your caregiver.  SEEK IMMEDIATE MEDICAL CARE IF:   You develop muscular weakness.   You have  decreased sensation or numbness.   You lose control of bowel or bladder function.   Your pain suddenly gets much worse.   You have an oral temperature above 102 F (38.9 C), not controlled by medication.   You develop shaking chills, confusion, chest pain, or shortness of breath.  Document Released: 09/09/2001 Document Revised: 12/07/2010 Document Reviewed: 12/17/2007 [facility]  Patient Information 2012 [place], [place].

## 2011-06-13 NOTE — ED Provider Notes (Signed)
History     CSN: [id number]  Arrival date & time 06/13/11  1225   First MD Initiated Contact with Patient 06/13/11 1329      Chief Complaint  Patient presents with  . Hip Pain  . Shoulder Pain    (Consider location/radiation/quality/duration/timing/severity/associated sxs/prior treatment) Patient is a 61 y.o. male presenting with hip pain and shoulder pain. The history is provided by the patient.  Hip Pain This is a new problem. Associated symptoms include arthralgias. Pertinent negatives include no abdominal pain, chest pain, coughing or neck pain.  Shoulder Pain Associated symptoms include arthralgias. Pertinent negatives include no abdominal pain, chest pain, coughing or neck pain.    Past Medical History  Diagnosis Date  . Asthma   . Emphysema   . Prostate disorder   . Hip pain   . Arthritis     Past Surgical History  Procedure Date  . Vascular surgery   . Lung surgery   . Abdominal surgery   . Hernia repair     History reviewed. No pertinent family history.  History  Substance Use Topics  . Smoking status: Current Everyday Smoker  . Smokeless tobacco: Not on file  . Alcohol Use: No      Review of Systems  Constitutional: Negative for activity change.       All ROS Neg except as noted in HPI  HENT: Negative for nosebleeds and neck pain.   Eyes: Negative for photophobia and discharge.  Respiratory: Positive for shortness of breath. Negative for cough and wheezing.   Cardiovascular: Negative for chest pain and palpitations.  Gastrointestinal: Negative for abdominal pain and blood in stool.  Genitourinary: Negative for dysuria, frequency and hematuria.  Musculoskeletal: Positive for arthralgias. Negative for back pain.  Skin: Negative.   Neurological: Negative for dizziness, seizures and speech difficulty.  Psychiatric/Behavioral: Negative for hallucinations and confusion.    Allergies  Review of patient's allergies indicates no known  allergies.  Home Medications   Current Outpatient Rx  Name Route Sig Dispense Refill  . IPRATROPIUM-ALBUTEROL 18-103 MCG/ACT IN AERO Inhalation Inhale 2 puffs into the lungs every 6 (six) hours as needed. Wheezing    . FLUTICASONE-SALMETEROL 500-50 MCG/DOSE IN AEPB Inhalation Inhale 1 puff into the lungs every 12 (twelve) hours as needed. Shortness of Breath    . OXYCODONE-ACETAMINOPHEN 5-500 MG PO TABS Oral Take 1 tablet by mouth every 4 (four) hours as needed. Pain    . TAMSULOSIN HCL 0.4 MG PO CAPS Oral Take 0.4 mg by mouth at bedtime.      BP 121/64  Pulse 73  Temp 97.6 F (36.4 C) (Oral)  Resp 18  SpO2 97%  Physical Exam  Nursing note and vitals reviewed. Constitutional: He is oriented to person, place, and time. He appears well-developed and well-nourished.  Non-toxic appearance.  HENT:  Head: Normocephalic.  Right Ear: Tympanic membrane and external ear normal.  Left Ear: Tympanic membrane and external ear normal.  Eyes: EOM and lids are normal. Pupils are equal, round, and reactive to light.  Neck: Normal range of motion. Neck supple. Carotid bruit is not present.  Cardiovascular: Normal rate, regular rhythm, normal heart sounds, intact distal pulses and normal pulses.   Pulmonary/Chest: Effort normal. No respiratory distress. He has no wheezes. He has no rales.  Abdominal: Soft. Bowel sounds are normal. There is no tenderness. There is no guarding.  Musculoskeletal: Normal range of motion.       Pain with attempted ROM of the right hip.  No dislocation. Mild to mod crepitus. Distal pulses wnl.  Lymphadenopathy:       Head (right side): No submandibular adenopathy present.       Head (left side): No submandibular adenopathy present.    He has no cervical adenopathy.  Neurological: He is alert and oriented to person, place, and time. He has normal strength. No cranial nerve deficit or sensory deficit.  Skin: Skin is warm and dry.  Psychiatric: He has a normal mood and  affect. His speech is normal.    ED Course  Procedures (including critical care time)  Labs Reviewed - No data to display No results found.   No diagnosis found.    MDM  I have reviewed nursing notes, vital signs, and all appropriate lab and imaging results for this patient. Discussed need for assistance with chronic hip pain. Previous records reviewed. No new changes found on exam today. Information on [facility] ortho clinics given to pt.       Kathie Dike, [place] 06/13/11 1409

## 2011-06-13 NOTE — ED Notes (Signed)
intermittant chronic R hip and L shoulder . States dx is arthritis. States dr Felecia Shelling stopped giving him his tylox. Has not had any tylox's x 2 months. Nad. rom wnl. Has been hurting x 10 years.

## 2011-07-26 ENCOUNTER — Encounter (HOSPITAL_COMMUNITY): Payer: Self-pay | Admitting: *Deleted

## 2011-07-26 ENCOUNTER — Emergency Department (HOSPITAL_COMMUNITY)
Admission: EM | Admit: 2011-07-26 | Discharge: 2011-07-26 | Disposition: A | Discharge status: Home / Self Care | Payer: Medicaid Other | Attending: Emergency Medicine | Admitting: Emergency Medicine

## 2011-07-26 DIAGNOSIS — F17200 Nicotine dependence, unspecified, uncomplicated: Secondary | ICD-10-CM | POA: Insufficient documentation

## 2011-07-26 DIAGNOSIS — J438 Other emphysema: Secondary | ICD-10-CM | POA: Insufficient documentation

## 2011-07-26 DIAGNOSIS — M25519 Pain in unspecified shoulder: Secondary | ICD-10-CM

## 2011-07-26 DIAGNOSIS — M25559 Pain in unspecified hip: Secondary | ICD-10-CM | POA: Insufficient documentation

## 2011-07-26 DIAGNOSIS — M129 Arthropathy, unspecified: Secondary | ICD-10-CM | POA: Insufficient documentation

## 2011-07-26 MED ORDER — OXYCODONE-ACETAMINOPHEN 5-325 MG PO TABS
2.0000 | ORAL_TABLET | Freq: Once | ORAL | Status: AC
  Administered 2011-07-26: 2 via ORAL
  Filled 2011-07-26: qty 2

## 2011-07-26 MED ORDER — OXYCODONE-ACETAMINOPHEN 5-500 MG PO TABS: 1.0000 | ORAL_TABLET | ORAL | Status: AC | PRN

## 2011-07-26 MED ORDER — HYDROCODONE-ACETAMINOPHEN 5-500 MG PO TABS: 2.0000 | ORAL_TABLET | Freq: Four times a day (QID) | ORAL | Status: AC | PRN

## 2011-07-26 NOTE — ED Notes (Signed)
Pt alert & oriented x4, stable gait. Patient given discharge instructions, paperwork & prescription(s). Patient  instructed to stop at the registration desk to finish any additional paperwork. Patient verbalized understanding. Pt left department w/ no further questions. 

## 2011-07-26 NOTE — ED Provider Notes (Signed)
History     CSN: [id number]  Arrival date & time 07/26/11  0212   First MD Initiated Contact with Patient 07/26/11 0234      Chief Complaint  Patient presents with  . Hip Pain    (Consider location/radiation/quality/duration/timing/severity/associated sxs/prior treatment) HPI  James Cervantes is a 61 y.o. male  With chronic hip pain and shoulder pain who presents to the Emergency Department complaining of increased hip and shoulder pain after his doctor changed his medication. He has taken 2 of his current medication without relief. Pain is worse with walking. Shoulder is worse with movement.  PCP Dr. Janna Arch  Past Medical History  Diagnosis Date  . Asthma   . Emphysema   . Prostate disorder   . Hip pain   . Arthritis     Past Surgical History  Procedure Date  . Vascular surgery   . Lung surgery   . Abdominal surgery   . Hernia repair     No family history on file.  History  Substance Use Topics  . Smoking status: Current Everyday Smoker  . Smokeless tobacco: Not on file  . Alcohol Use: No      Review of Systems  Constitutional: Negative for fever.       10 Systems reviewed and are negative for acute change except as noted in the HPI.  HENT: Negative for congestion.   Eyes: Negative for discharge and redness.  Respiratory: Negative for cough and shortness of breath.   Cardiovascular: Negative for chest pain.  Gastrointestinal: Negative for vomiting and abdominal pain.  Musculoskeletal: Negative for back pain.       Right hip pain, left shoulder pain  Skin: Negative for rash.  Neurological: Negative for syncope, numbness and headaches.  Psychiatric/Behavioral:       No behavior change.    Allergies  Review of patient's allergies indicates no known allergies.  Home Medications   Current Outpatient Rx  Name Route Sig Dispense Refill  . IPRATROPIUM-ALBUTEROL 18-103 MCG/ACT IN AERO Inhalation Inhale 2 puffs into the lungs every 6 (six) hours as needed.  Wheezing    . FLUTICASONE-SALMETEROL 500-50 MCG/DOSE IN AEPB Inhalation Inhale 1 puff into the lungs every 12 (twelve) hours as needed. Shortness of Breath    . OXYCODONE-ACETAMINOPHEN 5-325 MG PO TABS Oral Take 1 tablet by mouth daily as needed.    Marland Kitchen TAMSULOSIN HCL 0.4 MG PO CAPS Oral Take 0.4 mg by mouth at bedtime.    Marland Kitchen HYDROCODONE-ACETAMINOPHEN 7.5-325 MG PO TABS  1 po tid with food for pain 20 tablet 0  . HYDROCODONE-ACETAMINOPHEN 5-500 MG PO TABS Oral Take 2 tablets by mouth every 6 (six) hours as needed for pain. 30 tablet 0  . OXYCODONE-ACETAMINOPHEN 5-500 MG PO TABS Oral Take 1 tablet by mouth every 4 (four) hours as needed. Pain      BP 152/67  Temp 98.3 F (36.8 C) (Oral)  Resp 16  Ht 5\' 6"  (1.676 m)  Wt 122 lb (55.339 kg)  BMI 19.69 kg/m2  SpO2 99%  Physical Exam  Nursing note and vitals reviewed. Constitutional: He appears well-developed and well-nourished.       Awake, alert, nontoxic appearance.  HENT:  Head: Atraumatic.  Eyes: Right eye exhibits no discharge. Left eye exhibits no discharge.  Neck: Neck supple.  Cardiovascular: Normal heart sounds.   Pulmonary/Chest: Effort normal and breath sounds normal. He exhibits no tenderness.  Abdominal: Soft. There is no tenderness. There is no rebound.  Musculoskeletal: He  exhibits no tenderness.       Baseline ROM, no obvious new focal weakness.With ROM of right hip, patient has discomfort. Left shoulder with no deformity, crepitus, swelling.   Neurological:       Mental status and motor strength appears baseline for patient and situation.  Skin: No rash noted.  Psychiatric: He has a normal mood and affect.    ED Course  Procedures (including critical care time)  0234 In reviewing medication records, the patient was originally on percocet 5/500. He was switched to Vicodin 5/500. Most recently he was changed to Vicodin 5/325. Despite explaining to him the only difference between the two Vicodin Rx is the tylenol level,  he is convinced it is a markedly different medication. Will give him percocet here. Will write a new prescription for percocet 5/500. He plans to take the current vicodin Rx back to Dr. Janna Arch and "show him what's the the right prescription".  1. Hip pain       MDM  Patient with hip pain and shoulder pain. Here unable to understand the slight difference in the medication change made by his doctor. Given a Rx for percocet 5/500. Pt stable in ED with no significant deterioration in condition.The patient appears reasonably screened and/or stabilized for discharge and I doubt any other medical condition or other [facility] requiring further screening, evaluation, or treatment in the ED at this time prior to discharge.  MDM Reviewed: nursing note and vitals           Nicoletta Dress. Colon Branch, MD 07/26/11 [phone]

## 2011-07-26 NOTE — ED Notes (Signed)
Chronic hip pain, states pain meds he just picked up not working.

## 2011-07-26 NOTE — ED Notes (Signed)
Pt states had pain medication filled yesterday. States not working. Only different in meds is new script has less tylenol in it.

## 2011-12-06 ENCOUNTER — Telehealth: Payer: Self-pay

## 2011-12-06 ENCOUNTER — Telehealth: Payer: Self-pay | Admitting: Internal Medicine

## 2011-12-06 NOTE — Telephone Encounter (Signed)
Patient can wait until 2018 as long as there is no family history of colon cancer or polyps.

## 2011-12-06 NOTE — Telephone Encounter (Signed)
Recall made for 2018 

## 2011-12-06 NOTE — Telephone Encounter (Signed)
Pt came by the office in reference to the referral from Dr.Dondiego for a screening colonoscopy. He said he had a colonoscopy a few years ago ( but it did not show in [place]). I called [facility] medical records and [facility] faxed over the report. TCS was done 08/2006 and he had hyperplastic polyps. No adenomatous change or malignancy identified. He said he is not having any problems, no rectal bleeding, hemorrhoids, constipation or diarrhea. He said that was the only colonoscopy that he has ever had. I told him that his next will be due in 08/2016. He will call if he has any problems before then. Sending to [facility] to nic 08/2016.   FYI to Dr. Jena Gauss.  Letter sent to Dr. Janna Arch.

## 2011-12-06 NOTE — Telephone Encounter (Signed)
Addressed. See separate phone note.

## 2011-12-06 NOTE — Telephone Encounter (Signed)
Pt received a letter for triage and is in waiting room waiting for the nurse.

## 2011-12-07 NOTE — Telephone Encounter (Signed)
Pt said he has no family hx of colon cancer or polyps. If anything changes or he has problems he will call before nis next one is due.

## 2011-12-13 ENCOUNTER — Emergency Department (HOSPITAL_COMMUNITY)
Admission: EM | Admit: 2011-12-13 | Discharge: 2011-12-13 | Disposition: A | Discharge status: Home / Self Care | Payer: Medicaid Other | Attending: Emergency Medicine | Admitting: Emergency Medicine

## 2011-12-13 ENCOUNTER — Emergency Department (HOSPITAL_COMMUNITY): Payer: Medicaid Other

## 2011-12-13 ENCOUNTER — Encounter (HOSPITAL_COMMUNITY): Payer: Self-pay | Admitting: Emergency Medicine

## 2011-12-13 DIAGNOSIS — Z87448 Personal history of other diseases of urinary system: Secondary | ICD-10-CM | POA: Insufficient documentation

## 2011-12-13 DIAGNOSIS — Z79899 Other long term (current) drug therapy: Secondary | ICD-10-CM | POA: Insufficient documentation

## 2011-12-13 DIAGNOSIS — F17200 Nicotine dependence, unspecified, uncomplicated: Secondary | ICD-10-CM | POA: Insufficient documentation

## 2011-12-13 DIAGNOSIS — L03012 Cellulitis of left finger: Secondary | ICD-10-CM

## 2011-12-13 DIAGNOSIS — J45909 Unspecified asthma, uncomplicated: Secondary | ICD-10-CM | POA: Insufficient documentation

## 2011-12-13 DIAGNOSIS — L03019 Cellulitis of unspecified finger: Secondary | ICD-10-CM | POA: Insufficient documentation

## 2011-12-13 DIAGNOSIS — Z8739 Personal history of other diseases of the musculoskeletal system and connective tissue: Secondary | ICD-10-CM | POA: Insufficient documentation

## 2011-12-13 MED ORDER — SULFAMETHOXAZOLE-TMP DS 800-160 MG PO TABS
1.0000 | ORAL_TABLET | Freq: Once | ORAL | Status: AC
  Administered 2011-12-13: 1 via ORAL
  Filled 2011-12-13: qty 1

## 2011-12-13 MED ORDER — SULFAMETHOXAZOLE-TRIMETHOPRIM 800-160 MG PO TABS: 1.0000 | ORAL_TABLET | Freq: Two times a day (BID) | ORAL | Status: DC

## 2011-12-13 MED ORDER — HYDROCODONE-ACETAMINOPHEN 5-325 MG PO TABS: 1.0000 | ORAL_TABLET | ORAL | Status: AC | PRN

## 2011-12-13 MED ORDER — LIDOCAINE HCL (PF) 2 % IJ SOLN
INTRAMUSCULAR | Status: AC
  Administered 2011-12-13: 10 mL
  Filled 2011-12-13: qty 10

## 2011-12-13 NOTE — ED Notes (Signed)
Pt c/o L thumb pain and edema x 2 weeks, no known injury. No drainage, no open wound. Pt c/o pain at nail edges.

## 2011-12-16 NOTE — ED Provider Notes (Signed)
History     CSN: [id number]  Arrival date & time 12/13/11  1125   First MD Initiated Contact with Patient 12/13/11 1225      Chief Complaint  Patient presents with  . Hand Pain    (Consider location/radiation/quality/duration/timing/severity/associated sxs/prior treatment) HPI Comments: James Cervantes presents with left thumb pain and swelling which has been present for approximately the last 10 days.  He denies injury to the thumb,  But has had problems with peeling cuticles and chapping.  There has been no drainage from the finger despite soaking in warm salt water.  He denies radiation of pain which is constant and throbbing and worsened with palpation.  The history is provided by the patient.    Past Medical History  Diagnosis Date  . Asthma   . Emphysema   . Prostate disorder   . Hip pain   . Arthritis     Past Surgical History  Procedure Date  . Vascular surgery   . Lung surgery   . Abdominal surgery   . Hernia repair     Family History  Problem Relation Age of Onset  . Cancer Mother     History  Substance Use Topics  . Smoking status: Current Every Day Smoker -- 0.5 packs/day  . Smokeless tobacco: Not on file  . Alcohol Use: No      Review of Systems  Constitutional: Negative for fever.  HENT: Negative for congestion, sore throat and neck pain.   Eyes: Negative.   Respiratory: Negative for chest tightness and shortness of breath.   Cardiovascular: Negative for chest pain.  Gastrointestinal: Negative for nausea and abdominal pain.  Genitourinary: Negative.   Musculoskeletal: Positive for arthralgias. Negative for joint swelling.  Skin: Negative.  Negative for rash and wound.  Neurological: Negative for dizziness, weakness, light-headedness, numbness and headaches.  Hematological: Negative.   Psychiatric/Behavioral: Negative.     Allergies  Review of patient's allergies indicates no known allergies.  Home Medications   Current Outpatient Rx   Name  Route  Sig  Dispense  Refill  . IPRATROPIUM-ALBUTEROL 18-103 MCG/ACT IN AERO   Inhalation   Inhale 2 puffs into the lungs every 6 (six) hours as needed. Wheezing         . FLUTICASONE-SALMETEROL 500-50 MCG/DOSE IN AEPB   Inhalation   Inhale 1 puff into the lungs every 12 (twelve) hours as needed. Shortness of Breath         . OXYCODONE-ACETAMINOPHEN 5-500 MG PO CAPS   Oral   Take 1 capsule by mouth at bedtime.         . TAMSULOSIN HCL 0.4 MG PO CAPS   Oral   Take 0.4 mg by mouth at bedtime.         Marland Kitchen HYDROCODONE-ACETAMINOPHEN 5-325 MG PO TABS   Oral   Take 1 tablet by mouth every 4 (four) hours as needed for pain.   15 tablet   0   . SULFAMETHOXAZOLE-TRIMETHOPRIM 800-160 MG PO TABS   Oral   Take 1 tablet by mouth 2 (two) times daily.   28 tablet   0     BP 132/56  Pulse 68  Temp 97.6 F (36.4 C) (Oral)  Resp 14  Ht 5\' 6"  (1.676 m)  Wt 125 lb (56.7 kg)  BMI 20.18 kg/m2  SpO2 99%  Physical Exam  Constitutional: He is oriented to person, place, and time. He appears well-developed and well-nourished.  HENT:  Head: Normocephalic.  Cardiovascular: Normal  rate.   Pulmonary/Chest: Effort normal.  Musculoskeletal: He exhibits edema and tenderness.  Neurological: He is alert and oriented to person, place, and time. No sensory deficit.  Skin: There is erythema.       Edema, erythema and pus collection noted around lateral cuticle of left thumb.  Cap refill less than 3 seconds.  No red streaking.  Volar distal finger is soft,  Without fluctuance or induration.    ED Course  Procedures (including critical care time)  Labs Reviewed - No data to display No results found.   1. Paronychia of left thumb    INCISION AND DRAINAGE Performed by: Burgess Amor Consent: Verbal consent obtained. Risks and benefits: risks, benefits and alternatives were discussed Type: abscess - paronychia  Body area: left thumb  Anesthesia: digital block  Incision was made  with a scalpel to loosen cuticle edge from nail plate.  Local anesthetic: lidocaine 2% without epinephrine  Anesthetic total: 2 ml  Complexity: complex Blunt dissection to break up loculations  Drainage: purulent  Drainage amount: small  Packing material: no packing  Patient tolerance: Patient tolerated the procedure well with no immediate complications.      MDM  Pt prescribed bactrim.  Also prescribed hydrocodone.  Warm salt soaks, recheck in 2-3 days if not improved.        Burgess Amor, [place] 12/16/11 2117

## 2011-12-21 ENCOUNTER — Ambulatory Visit (HOSPITAL_COMMUNITY)
Admission: RE | Admit: 2011-12-21 | Discharge: 2011-12-21 | Disposition: A | Discharge status: Home / Self Care | Payer: Medicaid Other | Source: Ambulatory Visit | Attending: Family Medicine | Admitting: Family Medicine

## 2011-12-21 ENCOUNTER — Other Ambulatory Visit (HOSPITAL_COMMUNITY): Payer: Self-pay | Admitting: Family Medicine

## 2011-12-21 DIAGNOSIS — M1990 Unspecified osteoarthritis, unspecified site: Secondary | ICD-10-CM

## 2011-12-21 DIAGNOSIS — M1610 Unilateral primary osteoarthritis, unspecified hip: Secondary | ICD-10-CM | POA: Insufficient documentation

## 2011-12-21 DIAGNOSIS — M51379 Other intervertebral disc degeneration, lumbosacral region without mention of lumbar back pain or lower extremity pain: Secondary | ICD-10-CM | POA: Insufficient documentation

## 2011-12-21 DIAGNOSIS — M169 Osteoarthritis of hip, unspecified: Secondary | ICD-10-CM | POA: Insufficient documentation

## 2011-12-21 DIAGNOSIS — M5137 Other intervertebral disc degeneration, lumbosacral region: Secondary | ICD-10-CM | POA: Insufficient documentation

## 2011-12-24 NOTE — ED Provider Notes (Addendum)
Medical screening examination/treatment/procedure(s) were performed by non-physician practitioner and as supervising physician I was immediately available for consultation/collaboration.   Benny Lennert, MD 12/24/11 1034                  Medical screening examination/treatment/procedure(s) were performed by non-physician practitioner and as supervising physician I was immediately available for consultation/collaboration.   Benny Lennert, MD 12/25/11 [phone]

## 2012-02-18 ENCOUNTER — Ambulatory Visit (HOSPITAL_COMMUNITY)
Admission: RE | Admit: 2012-02-18 | Discharge: 2012-02-18 | Disposition: A | Discharge status: Home / Self Care | Payer: Medicaid Other | Source: Ambulatory Visit | Attending: Family Medicine | Admitting: Family Medicine

## 2012-02-18 ENCOUNTER — Other Ambulatory Visit (HOSPITAL_COMMUNITY): Payer: Self-pay | Admitting: Family Medicine

## 2012-02-18 DIAGNOSIS — J4489 Other specified chronic obstructive pulmonary disease: Secondary | ICD-10-CM | POA: Insufficient documentation

## 2012-02-18 DIAGNOSIS — J449 Chronic obstructive pulmonary disease, unspecified: Secondary | ICD-10-CM

## 2012-09-15 ENCOUNTER — Encounter (HOSPITAL_COMMUNITY): Payer: Self-pay | Admitting: *Deleted

## 2012-09-15 ENCOUNTER — Emergency Department (HOSPITAL_COMMUNITY)
Admission: EM | Admit: 2012-09-15 | Discharge: 2012-09-15 | Disposition: A | Discharge status: Home / Self Care | Payer: Medicaid Other | Attending: Emergency Medicine | Admitting: Emergency Medicine

## 2012-09-15 DIAGNOSIS — J45909 Unspecified asthma, uncomplicated: Secondary | ICD-10-CM | POA: Insufficient documentation

## 2012-09-15 DIAGNOSIS — Z79899 Other long term (current) drug therapy: Secondary | ICD-10-CM | POA: Insufficient documentation

## 2012-09-15 DIAGNOSIS — L739 Follicular disorder, unspecified: Secondary | ICD-10-CM

## 2012-09-15 DIAGNOSIS — Z8739 Personal history of other diseases of the musculoskeletal system and connective tissue: Secondary | ICD-10-CM | POA: Insufficient documentation

## 2012-09-15 DIAGNOSIS — F17200 Nicotine dependence, unspecified, uncomplicated: Secondary | ICD-10-CM | POA: Insufficient documentation

## 2012-09-15 DIAGNOSIS — Z87448 Personal history of other diseases of urinary system: Secondary | ICD-10-CM | POA: Insufficient documentation

## 2012-09-15 DIAGNOSIS — L738 Other specified follicular disorders: Secondary | ICD-10-CM | POA: Insufficient documentation

## 2012-09-15 DIAGNOSIS — L678 Other hair color and hair shaft abnormalities: Secondary | ICD-10-CM | POA: Insufficient documentation

## 2012-09-15 DIAGNOSIS — Z8709 Personal history of other diseases of the respiratory system: Secondary | ICD-10-CM | POA: Insufficient documentation

## 2012-09-15 LAB — URINALYSIS, ROUTINE W REFLEX MICROSCOPIC
Glucose, UA: NEGATIVE mg/dL
Leukocytes, UA: NEGATIVE
Specific Gravity, Urine: <1.005 — ABNORMAL LOW (ref 1.005–1.030)
pH: 7 (ref 5.0–8.0)

## 2012-09-15 MED ORDER — IBUPROFEN 800 MG PO TABS: 800.0000 mg | ORAL_TABLET | Freq: Three times a day (TID) | ORAL | Status: DC

## 2012-09-15 MED ORDER — SULFAMETHOXAZOLE-TRIMETHOPRIM 800-160 MG PO TABS: 1.0000 | ORAL_TABLET | Freq: Two times a day (BID) | ORAL | Status: DC

## 2012-09-15 NOTE — ED Notes (Signed)
Pt states there has been "pus-like" discharge coming out of the lesions on the sides of his penis when he "mashes" them.  He stated that he then puts alcohol on the wounds and lotion to make the swelling subside.

## 2012-09-15 NOTE — ED Provider Notes (Signed)
CSN: [id number]     Arrival date & time 09/15/12  1041 History   First MD Initiated Contact with Patient 09/15/12 1103     Chief Complaint  Patient presents with  . Groin Swelling   (Consider location/radiation/quality/duration/timing/severity/associated sxs/prior Treatment) HPI Comments: TAVIS KRING is a 62 y.o. male who presents to the Emergency Department complaining of  Three "open" sores to the shaft of his penis for several days.  States that he has been squeezing them and "yellow , watery stuff" comes out but the areas are not healing.  He also c/o "sore knots" to his bilateral groin .  He denies dysuria, fever, abd pain, vomiting, rash, testicular pain, back pain or penile discharge.    The history is provided by the patient.    Past Medical History  Diagnosis Date  . Asthma   . Emphysema   . Prostate disorder   . Hip pain   . Arthritis    Past Surgical History  Procedure Laterality Date  . Vascular surgery    . Lung surgery    . Abdominal surgery    . Hernia repair     Family History  Problem Relation Age of Onset  . Cancer Mother    History  Substance Use Topics  . Smoking status: Current Every Day Smoker -- 0.50 packs/day  . Smokeless tobacco: Not on file  . Alcohol Use: No    Review of Systems  Constitutional: Negative for fever, chills, activity change and appetite change.  HENT: Negative for sore throat, facial swelling, trouble swallowing, neck pain and neck stiffness.   Respiratory: Negative for chest tightness, shortness of breath and wheezing.   Gastrointestinal: Negative for nausea, vomiting and abdominal pain.  Genitourinary: Positive for genital sores. Negative for dysuria, hematuria, flank pain, decreased urine volume, discharge, penile swelling, scrotal swelling, difficulty urinating and testicular pain.  Musculoskeletal: Negative for arthralgias.  Skin: Positive for rash. Negative for wound.  Neurological: Negative for dizziness, weakness,  numbness and headaches.  All other systems reviewed and are negative.    Allergies  Review of patient's allergies indicates no known allergies.  Home Medications   Current Outpatient Rx  Name  Route  Sig  Dispense  Refill  . albuterol-ipratropium (COMBIVENT) 18-103 MCG/ACT inhaler   Inhalation   Inhale 2 puffs into the lungs every 6 (six) hours as needed. Wheezing         . Fluticasone-Salmeterol (ADVAIR) 500-50 MCG/DOSE AEPB   Inhalation   Inhale 1 puff into the lungs every 12 (twelve) hours as needed. Shortness of Breath         . oxyCODONE-acetaminophen (TYLOX) 5-500 MG per capsule   Oral   Take 1 capsule by mouth at bedtime.         . sulfamethoxazole-trimethoprim (SEPTRA DS) 800-160 MG per tablet   Oral   Take 1 tablet by mouth 2 (two) times daily.   28 tablet   0   . Tamsulosin HCl (FLOMAX) 0.4 MG CAPS   Oral   Take 0.4 mg by mouth at bedtime.          BP 140/70  Pulse 65  Temp(Src) 97.5 F (36.4 C) (Oral)  Resp 20  Ht 5\' 6"  (1.676 m)  Wt 124 lb (56.246 kg)  BMI 20.02 kg/m2  SpO2 100% Physical Exam  Nursing note and vitals reviewed. Constitutional: He is oriented to person, place, and time. He appears well-developed and well-nourished. No distress.  HENT:  Head: Normocephalic and  atraumatic.  Mouth/Throat: Oropharynx is clear and moist.  Neck: Normal range of motion. Neck supple. No thyromegaly present.  Cardiovascular: Normal rate, regular rhythm, normal heart sounds and intact distal pulses.   No murmur heard. Pulmonary/Chest: Effort normal and breath sounds normal. No respiratory distress.  Abdominal: Soft. He exhibits no distension and no mass. There is no tenderness. There is no rebound and no guarding.  Genitourinary: Testes normal. Cremasteric reflex is present. Right testis shows no swelling and no tenderness. Left testis shows no swelling and no tenderness. Circumcised. No phimosis, paraphimosis, penile erythema or penile tenderness. No  discharge found.  Two pin point open lesions to the shaft of the penis and one near the glans.  No edema, induration, drainage or surrounding erythema.  Bilateral inguinal lymphadenopathy.  Testes are non-tender on exam.  No scrotal edema, epidymidis is also non-tender  Musculoskeletal: Normal range of motion. He exhibits no edema and no tenderness.  Lymphadenopathy:    He has no cervical adenopathy.       Right: Inguinal adenopathy present.       Left: Inguinal adenopathy present.  Neurological: He is alert and oriented to person, place, and time. He exhibits normal muscle tone. Coordination normal.  Skin: Skin is warm and dry.  See GU exam    ED Course  Procedures (including critical care time) Labs Review Labs Reviewed  URINALYSIS, ROUTINE W REFLEX MICROSCOPIC - Abnormal; Notable for the following:    Specific Gravity, Urine <1.005 (*)    All other components within normal limits  GC/CHLAMYDIA PROBE AMP  RPR   Imaging Review No results found.  MDM   GC and Chlamydia culture pending.  RPR also pending.    two small open pustules to the shaft of the penis and one at the glans.  No erythema, urethral drainage or edema.  Symptoms likely related to follcululitis.  Patient agrees to close f/u with his PMD.  Will treat with bactrim and ibuprofen.  agrees to warm soaks, avoid squeezing.  Appears stable for discharge.   Artavia Jeanlouis L. Trisha Mangle, PA-C 09/15/12 2141

## 2012-09-15 NOTE — ED Notes (Signed)
Sore area on penis, denies d/c

## 2012-09-16 NOTE — ED Provider Notes (Signed)
Medical screening examination/treatment/procedure(s) were performed by non-physician practitioner and as supervising physician I was immediately available for consultation/collaboration.    Vida Roller, MD 09/16/12 [phone]

## 2012-10-12 ENCOUNTER — Observation Stay (HOSPITAL_COMMUNITY)
Admission: EM | Admit: 2012-10-12 | Discharge: 2012-10-14 | Discharge status: Against Medical Advice | Payer: Medicaid Other | Attending: Family Medicine | Admitting: Family Medicine

## 2012-10-12 ENCOUNTER — Emergency Department (HOSPITAL_COMMUNITY): Payer: Medicaid Other

## 2012-10-12 ENCOUNTER — Encounter (HOSPITAL_COMMUNITY): Payer: Self-pay | Admitting: Emergency Medicine

## 2012-10-12 ENCOUNTER — Other Ambulatory Visit: Payer: Self-pay

## 2012-10-12 DIAGNOSIS — R9431 Abnormal electrocardiogram [ECG] [EKG]: Secondary | ICD-10-CM | POA: Insufficient documentation

## 2012-10-12 DIAGNOSIS — B1710 Acute hepatitis C without hepatic coma: Secondary | ICD-10-CM

## 2012-10-12 DIAGNOSIS — I429 Cardiomyopathy, unspecified: Secondary | ICD-10-CM

## 2012-10-12 DIAGNOSIS — F17200 Nicotine dependence, unspecified, uncomplicated: Secondary | ICD-10-CM

## 2012-10-12 DIAGNOSIS — J438 Other emphysema: Secondary | ICD-10-CM | POA: Insufficient documentation

## 2012-10-12 DIAGNOSIS — R079 Chest pain, unspecified: Principal | ICD-10-CM | POA: Insufficient documentation

## 2012-10-12 DIAGNOSIS — M79609 Pain in unspecified limb: Secondary | ICD-10-CM | POA: Insufficient documentation

## 2012-10-12 DIAGNOSIS — R0789 Other chest pain: Secondary | ICD-10-CM

## 2012-10-12 DIAGNOSIS — K219 Gastro-esophageal reflux disease without esophagitis: Secondary | ICD-10-CM

## 2012-10-12 DIAGNOSIS — B1920 Unspecified viral hepatitis C without hepatic coma: Secondary | ICD-10-CM | POA: Insufficient documentation

## 2012-10-12 DIAGNOSIS — N400 Enlarged prostate without lower urinary tract symptoms: Secondary | ICD-10-CM | POA: Diagnosis present

## 2012-10-12 LAB — CBC WITH DIFFERENTIAL/PLATELET
Basophils Relative: 1 % (ref 0–1)
Hemoglobin: 13.2 g/dL (ref 13.0–17.0)
Lymphs Abs: 2.4 10*3/uL (ref 0.7–4.0)
Monocytes Relative: 7 % (ref 3–12)
Neutro Abs: 5 10*3/uL (ref 1.7–7.7)
Neutrophils Relative %: 60 % (ref 43–77)
RBC: 4.53 MIL/uL (ref 4.22–5.81)

## 2012-10-12 LAB — MRSA PCR SCREENING: MRSA by PCR: NEGATIVE

## 2012-10-12 LAB — BASIC METABOLIC PANEL
BUN: 11 mg/dL (ref 6–23)
Chloride: 101 mEq/L (ref 96–112)
GFR calc Af Amer: 69 mL/min — ABNORMAL LOW (ref >90)
Glucose, Bld: 79 mg/dL (ref 70–99)
Potassium: 4.6 mEq/L (ref 3.5–5.1)
Sodium: 136 mEq/L (ref 135–145)

## 2012-10-12 LAB — TROPONIN I
Troponin I: <0.3 ng/mL (ref <0.30)
Troponin I: <0.3 ng/mL (ref <0.30)
Troponin I: <0.3 ng/mL (ref <0.30)

## 2012-10-12 MED ORDER — GI COCKTAIL ~~LOC~~
30.0000 mL | Freq: Once | ORAL | Status: AC
  Administered 2012-10-12: 30 mL via ORAL
  Filled 2012-10-12: qty 30

## 2012-10-12 MED ORDER — OXYCODONE HCL 5 MG PO TABS
5.0000 mg | ORAL_TABLET | ORAL | Status: DC | PRN
  Administered 2012-10-12 – 2012-10-13 (×5): 5 mg via ORAL
  Filled 2012-10-12 (×5): qty 1

## 2012-10-12 MED ORDER — PANTOPRAZOLE SODIUM 40 MG PO TBEC
40.0000 mg | DELAYED_RELEASE_TABLET | Freq: Every day | ORAL | Status: DC
  Administered 2012-10-12: 40 mg via ORAL
  Filled 2012-10-12: qty 1

## 2012-10-12 MED ORDER — ONDANSETRON HCL 4 MG/2ML IJ SOLN: 4.0000 mg | Freq: Four times a day (QID) | INTRAMUSCULAR | Status: DC | PRN

## 2012-10-12 MED ORDER — SODIUM CHLORIDE 0.9 % IV SOLN
INTRAVENOUS | Status: DC
  Administered 2012-10-12 – 2012-10-13 (×2): via INTRAVENOUS

## 2012-10-12 MED ORDER — ALBUTEROL SULFATE (5 MG/ML) 0.5% IN NEBU
2.5000 mg | INHALATION_SOLUTION | RESPIRATORY_TRACT | Status: DC | PRN
  Administered 2012-10-12 – 2012-10-13 (×3): 2.5 mg via RESPIRATORY_TRACT
  Filled 2012-10-12 (×3): qty 0.5

## 2012-10-12 MED ORDER — HYDROCODONE-ACETAMINOPHEN 5-325 MG PO TABS
ORAL_TABLET | ORAL | Status: AC
  Filled 2012-10-12: qty 1

## 2012-10-12 MED ORDER — SODIUM CHLORIDE 0.9 % IJ SOLN
3.0000 mL | Freq: Two times a day (BID) | INTRAMUSCULAR | Status: DC
  Administered 2012-10-13 (×2): 3 mL via INTRAVENOUS

## 2012-10-12 MED ORDER — TAMSULOSIN HCL 0.4 MG PO CAPS
0.4000 mg | ORAL_CAPSULE | Freq: Every day | ORAL | Status: DC
  Administered 2012-10-12 – 2012-10-13 (×2): 0.4 mg via ORAL
  Filled 2012-10-12 (×4): qty 1

## 2012-10-12 MED ORDER — POTASSIUM CHLORIDE 20 MEQ/15ML (10%) PO LIQD
40.0000 meq | Freq: Once | ORAL | Status: DC
  Filled 2012-10-12: qty 30

## 2012-10-12 MED ORDER — PANTOPRAZOLE SODIUM 40 MG IV SOLR
40.0000 mg | INTRAVENOUS | Status: DC
  Administered 2012-10-13: 40 mg via INTRAVENOUS
  Filled 2012-10-12: qty 40

## 2012-10-12 MED ORDER — ACETAMINOPHEN 650 MG RE SUPP: 650.0000 mg | Freq: Four times a day (QID) | RECTAL | Status: DC | PRN

## 2012-10-12 MED ORDER — ACETAMINOPHEN 325 MG PO TABS: 650.0000 mg | ORAL_TABLET | Freq: Four times a day (QID) | ORAL | Status: DC | PRN

## 2012-10-12 MED ORDER — HYDROCODONE-ACETAMINOPHEN 5-325 MG PO TABS
1.0000 | ORAL_TABLET | Freq: Once | ORAL | Status: AC
  Administered 2012-10-12: 1 via ORAL

## 2012-10-12 MED ORDER — ONDANSETRON HCL 4 MG PO TABS: 4.0000 mg | ORAL_TABLET | Freq: Four times a day (QID) | ORAL | Status: DC | PRN

## 2012-10-12 MED ORDER — ENOXAPARIN SODIUM 40 MG/0.4ML ~~LOC~~ SOLN
40.0000 mg | SUBCUTANEOUS | Status: DC
  Administered 2012-10-12: 40 mg via SUBCUTANEOUS
  Filled 2012-10-12 (×2): qty 0.4

## 2012-10-12 MED ORDER — INFLUENZA VAC SPLIT QUAD 0.5 ML IM SUSP
0.5000 mL | INTRAMUSCULAR | Status: AC
  Administered 2012-10-13: 0.5 mL via INTRAMUSCULAR
  Filled 2012-10-12: qty 0.5

## 2012-10-12 MED ORDER — MORPHINE SULFATE 2 MG/ML IJ SOLN
1.0000 mg | INTRAMUSCULAR | Status: DC | PRN
  Administered 2012-10-12: 1 mg via INTRAVENOUS
  Filled 2012-10-12: qty 1

## 2012-10-12 MED ORDER — PNEUMOCOCCAL VAC POLYVALENT 25 MCG/0.5ML IJ INJ
0.5000 mL | INJECTION | INTRAMUSCULAR | Status: AC
  Administered 2012-10-13: 0.5 mL via INTRAMUSCULAR
  Filled 2012-10-12: qty 0.5

## 2012-10-12 MED ORDER — SENNOSIDES-DOCUSATE SODIUM 8.6-50 MG PO TABS
1.0000 | ORAL_TABLET | Freq: Every evening | ORAL | Status: DC | PRN
  Filled 2012-10-12: qty 1

## 2012-10-12 NOTE — H&P (Addendum)
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 He is coming in today with a four-day history of chest pain that is located mainly under his left armpit. It is very atypical in nature and that it is brought on by rest, lying down flat, seems to get better when he drinks something. Evaluation in the emergencydepartment shows a negative chest x-ray and negative troponin however his EKG is very abnormal with what appears to be a septal infarct and inferior T wave inversions. He denies shortness of breath, palpitations, dizziness, lightheadedness, diaphoresis. We have been asked to admit him for further evaluation and management.  Allergies:  No Known Allergies    Past Medical History  Diagnosis Date  . Asthma   . Emphysema   . Prostate disorder   . Hip pain   . Arthritis     Past Surgical History  Procedure Laterality Date  . Vascular surgery    . Lung surgery    . Abdominal surgery    . Hernia repair      Prior to Admission medications   Medication Sig Start Date End Date Taking? Authorizing Provider  albuterol-ipratropium (COMBIVENT) 18-103 MCG/ACT inhaler Inhale 2 puffs into the lungs every 6 (six) hours as needed. Wheezing   Yes Historical Provider, MD  oxyCODONE-acetaminophen (TYLOX) 5-500 MG per capsule Take 1 capsule by mouth at bedtime.   Yes Historical Provider, MD  Tamsulosin HCl (FLOMAX) 0.4 MG CAPS Take 0.4 mg by mouth at bedtime.   Yes Historical Provider, MD    Social History:  reports that he has been smoking.  He does not have any smokeless tobacco history on file. He reports that he does not drink alcohol or use illicit drugs.  Family History  Problem Relation Age of Onset   . Cancer Mother     Review of Systems:  Constitutional: Denies fever, chills, diaphoresis, appetite change and fatigue.  HEENT: Denies photophobia, eye pain, redness, hearing loss, ear pain, congestion, sore throat, rhinorrhea, sneezing, mouth sores, trouble swallowing, neck pain, neck stiffness and tinnitus.   Respiratory: Denies SOB, DOE, cough, chest tightness,  and wheezing.   Cardiovascular: Denies chest pain, palpitations and leg swelling.  Gastrointestinal: Denies nausea, vomiting, abdominal pain, diarrhea, constipation, blood in stool and abdominal distention.  Genitourinary: Denies dysuria, urgency, frequency, hematuria, flank pain and difficulty urinating.  Endocrine: Denies: hot or cold intolerance, sweats, changes in hair or nails, polyuria, polydipsia. Musculoskeletal: Denies myalgias, back pain, joint swelling, arthralgias and gait problem.  Skin: Denies pallor, rash and wound.  Neurological: Denies dizziness, seizures, syncope, weakness, light-headedness, numbness and headaches.  Hematological: Denies adenopathy. Easy bruising, personal or family bleeding history  Psychiatric/Behavioral: Denies suicidal ideation, mood changes, confusion, nervousness, sleep disturbance and agitation   Physical Exam: Blood pressure 123/64, pulse 60, temperature 97.4 F (36.3 C), temperature source Oral, resp. rate 14, SpO2 99.00%. General: Alert, awake, oriented x3. Severely underweight HEENT: Normocephalic, atraumatic, pupils equal and reactive to light, extraocular movements intact. Neck: Supple, no JVD, lymphadenopathy, no bruits, no goiter. Cardiovascular: Regular rate and rhythm, no murmurs, rubs or sounds. Lungs: Clear to auscultation Bilaterally.  Abdomen: Soft, nontender, nondistended, positive bowel sounds,  no mass organomegaly noted. Extremities: No clubbing, cyanosis or edema. Neurologic: Grossly intact and nonfocal.  Labs on Admission:  Results for orders placed during the  hospital encounter of 10/12/12 (from the past 48 hour(s))  TROPONIN I     Status: None   Collection Time    10/12/12 11:01 AM      Result Value Range   Troponin I <0.30  <0.30 ng/mL   Comment:            Due to the release kinetics of cTnI,     a negative result within the first hours     of the onset of symptoms does not rule out     myocardial infarction with certainty.     If myocardial infarction is still suspected,     repeat the test at appropriate intervals.  CBC WITH DIFFERENTIAL     Status: None   Collection Time    10/12/12 11:01 AM      Result Value Range   WBC 8.3  4.0 - 10.5 K/uL   RBC 4.53  4.22 - 5.81 MIL/uL   Hemoglobin 13.2  13.0 - 17.0 g/dL   HCT 16.1  09.6 - 04.5 %   MCV 87.2  78.0 - 100.0 fL   MCH 29.1  26.0 - 34.0 pg   MCHC 33.4  30.0 - 36.0 g/dL   RDW 40.9  81.1 - 91.4 %   Platelets 395  150 - 400 K/uL   Neutrophils Relative % 60  43 - 77 %   Neutro Abs 5.0  1.7 - 7.7 K/uL   Lymphocytes Relative 30  12 - 46 %   Lymphs Abs 2.4  0.7 - 4.0 K/uL   Monocytes Relative 7  3 - 12 %   Monocytes Absolute 0.6  0.1 - 1.0 K/uL   Eosinophils Relative 3  0 - 5 %   Eosinophils Absolute 0.3  0.0 - 0.7 K/uL   Basophils Relative 1  0 - 1 %   Basophils Absolute 0.0  0.0 - 0.1 K/uL  BASIC METABOLIC PANEL     Status: Abnormal   Collection Time    10/12/12 11:01 AM      Result Value Range   Sodium 136  135 - 145 mEq/L   Potassium 4.6  3.5 - 5.1 mEq/L   Chloride 101  96 - 112 mEq/L   CO2 28  19 - 32 mEq/L   Glucose, Bld 79  70 - 99 mg/dL   BUN 11  6 - 23 mg/dL   Creatinine, Ser 7.82  0.50 - 1.35 mg/dL   Calcium 9.5  8.4 - 95.6 mg/dL   GFR calc non Af Amer 59 (*) >90 mL/min   GFR calc Af Amer 69 (*) >90 mL/min   Comment: (NOTE)     The eGFR has been calculated using the CKD EPI equation.     This calculation has not been validated in all clinical situations.     eGFR's persistently <90 mL/min signify possible Chronic Kidney     Disease.  LIPASE, BLOOD     Status:  None   Collection Time    10/12/12 11:53 AM      Result Value Range   Lipase 30  11 - 59 U/L    Radiological Exams on Admission: Dg Chest 2 View  10/12/2012   CLINICAL DATA:  Nipple markers for comparison.  EXAM: CHEST  2 VIEW  COMPARISON:  10/12/2012 at 11:05 a.m.  FINDINGS:  The nodular shadow seen on the prior study is not evident, but most likely was due to a nipple shadow. The shadow created by the nipple on the current exam is minimal.  Extensive consolidation volume loss on the left is unchanged. The right lung remains hyperexpanded. No pneumothorax. No right pleural effusion.  IMPRESSION: Nodule suggested previously presumably was a nipple shadow. It is no longer evident. Exam otherwise stable from the earlier study.   Electronically Signed   By: Amie Portland M.D.   On: 10/12/2012 12:41   Dg Chest Portable 1 View  10/12/2012   CLINICAL DATA:  Left chest pain.  EXAM: PORTABLE CHEST - 1 VIEW  COMPARISON:  02/18/2012  FINDINGS: Again noted is marked volume loss in the left hemithorax consistent with partial lung resection. There may be slightly increased densities in the left lower lung but minimal change. Again noted is hyperexpansion of the right lung. There is a nodular density in the lateral right chest which could represent a nipple shadow but indeterminate. Mediastinal shift towards the left.  IMPRESSION: There is chronic volume loss in the left hemithorax consistent partial lung resection. There may be slightly increased densities in the left lower lung but minimal change from the previous examination.  Nodular density in the right lower chest appears stable from prior examinations and probably represents a nipple shadow. This could be confirmed with a followup study with nipple markers.   Electronically Signed   By: Richarda Overlie M.D.   On: 10/12/2012 11:19    Assessment/Plan Principal Problem:   Chest pain Active Problems:   Abnormal EKG   DISORDER, TOBACCO USE   BENIGN PROSTATIC  HYPERTROPHY   GERD (gastroesophageal reflux disease)   Chest pain -By history very atypical for cardiac etiology, however has EKG that is concerning. -Will admit to telemetry, will cycle troponins, we'll check 2-D echo, have requested cardiology consultation and will keep him n.p.o. after midnight in case he ruless out and cardiology thinks a stress test is in order. -His only coronary artery disease risk factor is tobacco abuse. -Will also start him on Protonix in case this is from GI origin. He does admit to belching and a prior history of acid reflux disease.  DVT prophylaxis -Lovenox.  Time Spent on Admission: 65 minutes  James Cervantes,ESTELA [facility] Pager: [phone] 10/12/2012, 5:03 PM

## 2012-10-12 NOTE — ED Notes (Signed)
Pt c/o pain under left arm to rib area with no radiation intermittent x 4 days. Denies n/v/d/dizziness/ states is a stabbing pain that hurts worse with movement. No resp distress noted. States sometimes makes him sob. Denies sweating. Has had left lung surgery years ago.

## 2012-10-12 NOTE — ED Notes (Signed)
Complain of pain in left side. Also, request something to drink

## 2012-10-12 NOTE — ED Provider Notes (Signed)
CSN: [id number]     Arrival date & time 10/12/12  1030 History  This chart was scribed for Shon Baton, MD by Caryn Bee, ED Scribe. This patient was seen in room [facility] and the patient's care was started 11:35 AM.    Chief Complaint  Patient presents with  . Chest Pain   The history is provided by the patient. No language interpreter was used.   HPI Comments: James Cervantes is a 62 y.o. male who presents to the Emergency Department complaining of sudden onset intermittent, sharp left sided chest pain that began 10/08/2012. The pt states that if he drinks something the pain subsides. He also states that lying on his left side helps the pain. Pt states he also has chills and a productive cough. He denies vomiting, nausea, fever or abdominal pain. Pt has h/o asthma and has SOB at baseline. Pt denies h/o HTN or high cholesterol. Pt is not a smoker and does not use alcohol. Pt does not know of family h/o heart disease. Pt has h/o left lung surgery years ago.   Past Medical History  Diagnosis Date  . Asthma   . Emphysema   . Prostate disorder   . Hip pain   . Arthritis    Past Surgical History  Procedure Laterality Date  . Vascular surgery    . Lung surgery    . Abdominal surgery    . Hernia repair     Family History  Problem Relation Age of Onset  . Cancer Mother    History  Substance Use Topics  . Smoking status: Current Every Day Smoker -- 0.50 packs/day for 50 years  . Smokeless tobacco: Not on file  . Alcohol Use: No    Review of Systems  Constitutional: Positive for chills. Negative for fever.  Respiratory: Positive for cough and shortness of breath. Negative for chest tightness.   Cardiovascular: Positive for chest pain.  Gastrointestinal: Negative.  Negative for nausea, vomiting and abdominal pain.  Genitourinary: Negative.  Negative for dysuria.  Musculoskeletal: Negative for back pain.  Skin: Negative for rash.  Neurological: Negative for headaches.   All other systems reviewed and are negative.    Allergies  Review of patient's allergies indicates no known allergies.  Home Medications   No current outpatient prescriptions on file. Triage Vitals: BP 130/74  Pulse 60  Temp(Src) 97.4 F (36.3 C) (Oral)  Resp 17  SpO2 99%  Physical Exam  Nursing note and vitals reviewed. Constitutional: He is oriented to person, place, and time. He appears well-developed and well-nourished.  HENT:  Head: Normocephalic and atraumatic.  Eyes: Pupils are equal, round, and reactive to light.  Neck: Neck supple.  Cardiovascular: Normal rate, regular rhythm and normal heart sounds.   No murmur heard. Pulmonary/Chest: Effort normal and breath sounds normal. No respiratory distress. He has no wheezes. He exhibits no tenderness.  Abdominal: Soft. Bowel sounds are normal. There is no tenderness. There is no rebound.  Musculoskeletal: He exhibits no edema.  Lymphadenopathy:    He has no cervical adenopathy.  Neurological: He is alert and oriented to person, place, and time.  Skin: Skin is warm and dry.  Psychiatric: He has a normal mood and affect.    ED Course  Procedures (including critical care time) DIAGNOSTIC STUDIES: Oxygen Saturation is 99% on room air, normal by my interpretation.    COORDINATION OF CARE: 11:49 AM-Discussed treatment plan which includes GI cocktail with pt at bedside and pt agreed to  plan.   Labs Review Labs Reviewed  BASIC METABOLIC PANEL - Abnormal; Notable for the following:    GFR calc non Af Amer 59 (*)    GFR calc Af Amer 69 (*)    All other components within normal limits  PRO B NATRIURETIC PEPTIDE - Abnormal; Notable for the following:    Pro B Natriuretic peptide (BNP) 337.6 (*)    All other components within normal limits  TROPONIN I  CBC WITH DIFFERENTIAL  LIPASE, BLOOD  TROPONIN I  TSH  TROPONIN I  TROPONIN I  HEMOGLOBIN A1C  BASIC METABOLIC PANEL  CBC   Imaging Review Dg Chest 2  View  10/12/2012   CLINICAL DATA:  Nipple markers for comparison.  EXAM: CHEST  2 VIEW  COMPARISON:  10/12/2012 at 11:05 a.m.  FINDINGS: The nodular shadow seen on the prior study is not evident, but most likely was due to a nipple shadow. The shadow created by the nipple on the current exam is minimal.  Extensive consolidation volume loss on the left is unchanged. The right lung remains hyperexpanded. No pneumothorax. No right pleural effusion.  IMPRESSION: Nodule suggested previously presumably was a nipple shadow. It is no longer evident. Exam otherwise stable from the earlier study.   Electronically Signed   By: Amie Portland M.D.   On: 10/12/2012 12:41   Dg Chest Portable 1 View  10/12/2012   CLINICAL DATA:  Left chest pain.  EXAM: PORTABLE CHEST - 1 VIEW  COMPARISON:  02/18/2012  FINDINGS: Again noted is marked volume loss in the left hemithorax consistent with partial lung resection. There may be slightly increased densities in the left lower lung but minimal change. Again noted is hyperexpansion of the right lung. There is a nodular density in the lateral right chest which could represent a nipple shadow but indeterminate. Mediastinal shift towards the left.  IMPRESSION: There is chronic volume loss in the left hemithorax consistent partial lung resection. There may be slightly increased densities in the left lower lung but minimal change from the previous examination.  Nodular density in the right lower chest appears stable from prior examinations and probably represents a nipple shadow. This could be confirmed with a followup study with nipple markers.   Electronically Signed   By: Richarda Overlie M.D.   On: 10/12/2012 11:19    EKG Interpretation     Ventricular Rate:    PR Interval:    QRS Duration:   QT Interval:    QTC Calculation:   R Axis:     Text Interpretation:             EKG:  NSR with a rate of 65, Q waves in anterior leads, PACs present, no prior for comparison. MDM    1. Abnormal EKG   2.   3. Chest pain    Patient presents with sharp left sided chest pain x 4 days.  NOntoxic and vital signs reassuring. Pain is atypical for ACS; however, patient does not have a normal EKG.  Patient is low risk for PE and no evidence of lower extremity swelling, hypoxia or pleurisy.  Trop neg.  Suspect GI source of pain.  Patient given GI cocktail and protonix without relief.  Patient reports continued pain.  While low suspcion for ACS, given abnormal EKG patient will be admitted for serial enzymes.  I personally performed the services described in this documentation, which was scribed in my presence. The recorded information has been reviewed and is accurate.  Shon Baton, MD 10/12/12 [phone]

## 2012-10-13 DIAGNOSIS — R0789 Other chest pain: Secondary | ICD-10-CM

## 2012-10-13 DIAGNOSIS — I369 Nonrheumatic tricuspid valve disorder, unspecified: Secondary | ICD-10-CM

## 2012-10-13 LAB — CBC
HCT: 33.2 % — ABNORMAL LOW (ref 39.0–52.0)
MCH: 28.7 pg (ref 26.0–34.0)
MCHC: 32.8 g/dL (ref 30.0–36.0)
MCV: 87.4 fL (ref 78.0–100.0)
RBC: 3.8 MIL/uL — ABNORMAL LOW (ref 4.22–5.81)
RDW: 13.9 % (ref 11.5–15.5)

## 2012-10-13 LAB — BASIC METABOLIC PANEL
BUN: 11 mg/dL (ref 6–23)
CO2: 26 mEq/L (ref 19–32)
Calcium: 8.1 mg/dL — ABNORMAL LOW (ref 8.4–10.5)
Chloride: 108 mEq/L (ref 96–112)
Creatinine, Ser: 1.25 mg/dL (ref 0.50–1.35)
GFR calc Af Amer: 70 mL/min — ABNORMAL LOW (ref >90)
GFR calc non Af Amer: 61 mL/min — ABNORMAL LOW (ref >90)
Glucose, Bld: 87 mg/dL (ref 70–99)
Potassium: 4.2 mEq/L (ref 3.5–5.1)
Sodium: 140 mEq/L (ref 135–145)

## 2012-10-13 LAB — HEMOGLOBIN A1C
Hgb A1c MFr Bld: 5.6 % (ref <5.7)
Mean Plasma Glucose: 114 mg/dL (ref <117)

## 2012-10-13 LAB — LIPID PANEL
Cholesterol: 89 mg/dL (ref 0–200)
HDL: 56 mg/dL (ref >39)
LDL Cholesterol: 26 mg/dL (ref 0–99)
VLDL: 7 mg/dL (ref 0–40)

## 2012-10-13 LAB — TSH: TSH: 0.679 u[IU]/mL (ref 0.350–4.500)

## 2012-10-13 MED ORDER — PANTOPRAZOLE SODIUM 40 MG PO TBEC: 40.0000 mg | DELAYED_RELEASE_TABLET | Freq: Every day | ORAL | Status: DC

## 2012-10-13 MED ORDER — GUAIFENESIN 100 MG/5ML PO SOLN
5.0000 mL | ORAL | Status: DC | PRN
  Administered 2012-10-13 (×2): 100 mg via ORAL
  Filled 2012-10-13 (×3): qty 5

## 2012-10-13 MED ORDER — NICOTINE 21 MG/24HR TD PT24
21.0000 mg | MEDICATED_PATCH | Freq: Every day | TRANSDERMAL | Status: DC
  Administered 2012-10-13: 21 mg via TRANSDERMAL
  Filled 2012-10-13: qty 1

## 2012-10-13 NOTE — Care Management Note (Signed)
    Page 1 of 1   10/13/2012     11:31:47 AM   CARE MANAGEMENT NOTE 10/13/2012  Patient:  James Cervantes, James Cervantes   Account Number:  [id number]  Date Initiated:  10/13/2012  Documentation initiated by:  Sharrie Rothman  Subjective/Objective Assessment:   Pt admitted from home with CP. Pt lives alone and will return home at discharge. Pt is independent with ADL's.     Action/Plan:   No CM needs anticipated.   Anticipated DC Date:  10/14/2012   Anticipated DC Plan:  HOME/SELF CARE      DC Planning Services  CM consult      Choice offered to / List presented to:             Status of service:  Completed, signed off Medicare Important Message given?   (If response is "NO", the following Medicare IM given date fields will be blank) Date Medicare IM given:   Date Additional Medicare IM given:    Discharge Disposition:  HOME/SELF CARE  Per UR Regulation:    If discussed at Long Length of Stay Meetings, dates discussed:    Comments:  10/13/12 1130 Arlyss Queen, RN BSN CM

## 2012-10-13 NOTE — Progress Notes (Signed)
*  PRELIMINARY RESULTS* Echocardiogram 2D Echocardiogram has been performed.  Conrad  10/13/2012, 3:31 PM

## 2012-10-13 NOTE — Progress Notes (Signed)
[id number] 

## 2012-10-13 NOTE — Consult Note (Signed)
CARDIOLOGY CONSULT NOTE   Patient ID: James Cervantes MRN: [id number] DOB/AGE: [birth year] 62 y.o.  Admit Date: 10/12/2012 Referring Physician: Janna Arch MD Primary Physician: Isabella Stalling, MD Consulting Cardiologist: Nona Dell MD Reason for Consultation: Chest Pain, Abnormal ECG  Clinical Summary James Cervantes is a 62 y.o.male with admitted with cough, congestion, and sharp pain in the left axillary area radiating occasionally into the left chest. The pain was constant for 4 days, waxing and waning, with worsening symptoms during coughing. 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 He states he saw a cardiologist years ago in [place], [place] for frequent palpitations. (Review of Care Everywhere does not reveal prior admissions in [place]).  ECG demonstrated inferior T-wave inversion and flattening on admission, with short PR interval. Troponin negative X 3. Follow up ECG had resolution of flattening but some T-wave inversion is again noted.     His main complaint is coughing with pain associated. Pain is mildly reproducible on palpation of the left chest. Review of telemetry, demonstrated brief episode of SVT rate of 127 bpm around 7 am this morning. Patient states he has felt skipping heart beats and occasional racing. Just wants to get cough syrup and something for pain so he can return home.    No Known Allergies  Medications Scheduled Medications: . enoxaparin (LOVENOX) injection  40 mg Subcutaneous Q24H  . influenza vac split quadrivalent PF  0.5 mL Intramuscular Tomorrow-1000  . nicotine  21 mg Transdermal Daily  . pantoprazole (PROTONIX) IV  40 mg Intravenous Q24H  . pneumococcal 23 valent vaccine  0.5 mL Intramuscular Tomorrow-1000  . sodium chloride  3 mL Intravenous Q12H  . tamsulosin  0.4 mg Oral QHS     Infusions: . sodium chloride 75 mL/hr at 10/13/12 0316     PRN  Medications:  acetaminophen, acetaminophen, albuterol, guaiFENesin, morphine injection, ondansetron (ZOFRAN) IV, ondansetron, oxyCODONE, senna-docusate   Past Medical History  Diagnosis Date  . Asthma   . Emphysema   . Prostate disorder   . Hip pain   . Arthritis     Past Surgical History  Procedure Laterality Date  . Vascular surgery    . Lung surgery    . Abdominal surgery    . Hernia repair      Family History  Problem Relation Age of Onset  . Cancer Mother     Social History James Cervantes reports that he has been smoking.  He does not have any smokeless tobacco history on file. James Cervantes reports that he does not drink alcohol.  Review of Systems Otherwise reviewed and negative except as outlined.  Physical Examination Blood pressure 101/56, pulse 68, temperature 97.5 F (36.4 C), temperature source Oral, resp. rate 18, height 5\' 6"  (1.676 m), weight 112 lb 3.4 oz (50.9 kg), SpO2 95.00%.  Intake/Output Summary (Last 24 hours) at 10/13/12 0807 Last data filed at 10/13/12 0500  Gross per 24 hour  Intake    865 ml  Output      0 ml  Net    865 ml    HEENT: Conjunctiva and lids normal, oropharynx clear with moist mucosa. Neck: Supple, no elevated JVP or carotid bruits, no thyromegaly. Lungs: Expiratory wheezes on the left, diminished breath sounds in the left base, clear on the right. Coughing elicited by deep inspiration. Cardiac: Regular rate and rhythm, no S3 or significant systolic murmur, no pericardial rub. Abdomen: Soft, nontender, no hepatomegaly, bowel  sounds present, no guarding or rebound. Extremities: No pitting edema, distal pulses 2+. Skin: Warm and dry. Musculoskeletal: No kyphosis. Neuropsychiatric: Alert and oriented x3, affect grossly appropriate.   Lab Results  Basic Metabolic Panel:  Recent Labs Lab 10/12/12 1101 10/13/12 0513  NA 136 140  K 4.6 4.2  CL 101 108  CO2 28 26  GLUCOSE 79 87  BUN 11 11  CREATININE 1.27 1.25  CALCIUM 9.5  8.1*   CBC:  Recent Labs Lab 10/12/12 1101 10/13/12 0513  WBC 8.3 8.4  NEUTROABS 5.0  --   HGB 13.2 10.9*  HCT 39.5 33.2*  MCV 87.2 87.4  PLT 395 335    Cardiac Enzymes:  Recent Labs Lab 10/12/12 1101 10/12/12 1736 10/12/12 2258 10/13/12 0513  TROPONINI <0.30 <0.30 <0.30 <0.30    Radiology: Dg Chest 2 View  10/12/2012   CLINICAL DATA:  Nipple markers for comparison.  EXAM: CHEST  2 VIEW  COMPARISON:  10/12/2012 at 11:05 a.m.  FINDINGS: The nodular shadow seen on the prior study is not evident, but most likely was due to a nipple shadow. The shadow created by the nipple on the current exam is minimal.  Extensive consolidation volume loss on the left is unchanged. The right lung remains hyperexpanded. No pneumothorax. No right pleural effusion.  IMPRESSION: Nodule suggested previously presumably was a nipple shadow. It is no longer evident. Exam otherwise stable from the earlier study.   Electronically Signed   By: Amie Portland M.D.   On: 10/12/2012 12:41   Dg Chest Portable 1 View  10/12/2012   CLINICAL DATA:  Left chest pain.  EXAM: PORTABLE CHEST - 1 VIEW  COMPARISON:  02/18/2012  FINDINGS: Again noted is marked volume loss in the left hemithorax consistent with partial lung resection. There may be slightly increased densities in the left lower lung but minimal change. Again noted is hyperexpansion of the right lung. There is a nodular density in the lateral right chest which could represent a nipple shadow but indeterminate. Mediastinal shift towards the left.  IMPRESSION: There is chronic volume loss in the left hemithorax consistent partial lung resection. There may be slightly increased densities in the left lower lung but minimal change from the previous examination.  Nodular density in the right lower chest appears stable from prior examinations and probably represents a nipple shadow. This could be confirmed with a followup study with nipple markers.   Electronically Signed    By: Richarda Overlie M.D.   On: 10/12/2012 11:19    ECG: (Admission)  NSR with T-wave inversion and flattening in the lateral leadsm, PVC's with short PR interval rate o 65 bpm.  Impression and Recommendations:  1. Chest pain: Atypical presentation, as pain was constant, sharp, waxing and waning for 4 days, located in the left axillary area, spreading to the left chest, with associated coughing and chest congestion. Some productive coughing and congestion, yellow and black sputum. He has no prior cardiac history of any significance. CVRF of hypertension, tobacco abuse, age. Will have echocardiogram completed for LV fx.  2. COPD:  Probable bronchitis with productive coughing and congestion with productive sputum. He is now on expectorants. Will defer to PCP.  3. Ongoing tobacco abuse: Smokes cigars 1/3 ppd.   4. Prior left lung resection: Uncertain etiology of this. Pt states he was coughing up blood.    Signed: Bettey Mare. Lyman Bishop NP [facility] 10/13/2012, 8:07 AM Co-Sign MD   Attending note:  Patient seen  and examined. Modified above note by Ms. Lawrence NP. Mr. Pellum presents with very atypical, sharp, nearly constant left axillary discomfort with occasional radiation around to the left side of his chest. He states that drinking water makes the symptoms better. He has also had chest congestion with coughing, somewhat productive over the last 4 or 5 days. Possibly fevers, although he is not certain. His ECG shows fairly nonspecific ST-T wave changes, decreased anteroseptal R waves. He has no clearly documented history of CAD or prior myocardial infarction, has possibly had SVT in the past. Only brief bursts of SVT noted on telemetry, not clear that this was symptomatic. Do not plan to initiate calcium channel blocker or beta blocker at this time, particularly with low normal blood pressure and bradycardia at baseline.  We will obtain an echocardiogram to assess cardiac structure and  function. If he has no wall motion abnormalities or evidence of cardiomyopathy, probably do not need to pursue ischemic workup at this point. Consider possible pulmonary etiology for his current symptoms.   Jonelle Sidle, M.D., F.A.C.C.

## 2012-10-13 NOTE — Progress Notes (Signed)
NAME:  James Cervantes, James Cervantes NO.:  [id number]  MEDICAL RECORD NO.:  [id number]  LOCATION:  A313                          FACILITY:  [facility]  PHYSICIAN:  Melvyn Novas, MDDATE OF BIRTH:  [birth year]  DATE OF PROCEDURE: DATE OF DISCHARGE:                                PROGRESS NOTE   The patient has history of BPH, hypertension, smoking a pack per day for many years, some COPD, tobacco abuse, came in with some left-sided axillary chest discomfort.  Troponins were negative.  EKG revealed possibility of an anteroseptal infarct, age undetermined but there were new changes since several months ago.  The hemoglobin is 10.9.  PHYSICAL EXAMINATION:  VITAL SIGNS:  Blood pressure 101/56, temperature 97.5, pulse 68 and regular, respiratory rate is 18. LUNGS:  Prolonged expiratory phase.  Scattered rhonchi.  No rales, no wheeze. HEART:  Regular rate and rhythm.  No S3-S4.  No heaves, thrills, or rubs. CHEST:  No chest wall tenderness left side of the chest or axillary area.  No evidence of cellulitis in the axillary area.  PLAN:  Right now is to obtain Cardiology consultation to further elucidate ECG changes which were new consistent with septal infarct.  We will obtain lipid panel to see his lipid status.  We will place nicotine patch.  Troponins were negative x3.  I will make further recommendations as the database expands.     Melvyn Novas, MD     RMD/MEDQ  D:  10/13/2012  T:  10/13/2012  Job:  [id number]

## 2012-10-13 NOTE — Progress Notes (Signed)
UR chart review completed.  

## 2012-10-13 NOTE — Progress Notes (Signed)
The patient is receiving Protonix by the intravenous route.  Based on criteria approved by the Pharmacy and Therapeutics Committee and the Medical Executive Committee, the medication is being converted to the equivalent oral dose form.  These criteria include: -No Active GI bleeding -Able to tolerate diet of full liquids (or better) or tube feeding OR able to tolerate other medications by the oral or enteral route  If you have any questions about this conversion, please contact the Pharmacy Department (ext [phone]).  Thank you.  Elson Clan, [facility] 10/13/2012 10:17 AM

## 2012-10-14 DIAGNOSIS — I429 Cardiomyopathy, unspecified: Secondary | ICD-10-CM

## 2012-10-14 DIAGNOSIS — F17200 Nicotine dependence, unspecified, uncomplicated: Secondary | ICD-10-CM

## 2012-10-14 MED ORDER — ALBUTEROL SULFATE (5 MG/ML) 0.5% IN NEBU: 2.5000 mg | INHALATION_SOLUTION | Freq: Four times a day (QID) | RESPIRATORY_TRACT | Status: DC | PRN

## 2012-10-14 NOTE — Progress Notes (Signed)
Consulting cardiologist: Nona Dell MD  Subjective:   Fully closed, eager to go home. States his chest pain has improved.   Objective:   Temp:  [97.5 F (36.4 C)-98 F (36.7 C)] 98 F (36.7 C) (10/14 0516) Pulse Rate:  [63-69] 69 (10/14 0516) Resp:  [18-19] 18 (10/14 0516) BP: (139-141)/(64-70) 141/70 mmHg (10/14 0516) SpO2:  [97 %-99 %] 99 % (10/14 0516) Last BM Date: 10/12/12  Filed Weights   10/12/12 1728  Weight: 112 lb 3.4 oz (50.9 kg)    Intake/Output Summary (Last 24 hours) at 10/14/12 1025 Last data filed at 10/13/12 1737  Gross per 24 hour  Intake    480 ml  Output      0 ml  Net    480 ml    Telemetry: Patient removed this. Refuses to wear.  Exam:  General: No acute distress.  Lungs:  Wheezing on in the right base, absent in the left base.  Cardiac: No elevated JVP or bruits. RRR, no gallop or rub.   Abdomen: Normoactive bowel sounds, nontender, nondistended.  Extremities: No pitting edema, distal pulses full.   Lab Results:  Basic Metabolic Panel:  Recent Labs Lab 10/12/12 1101 10/13/12 0513  NA 136 140  K 4.6 4.2  CL 101 108  CO2 28 26  GLUCOSE 79 87  BUN 11 11  CREATININE 1.27 1.25  CALCIUM 9.5 8.1*    CBC:  Recent Labs Lab 10/12/12 1101 10/13/12 0513  WBC 8.3 8.4  HGB 13.2 10.9*  HCT 39.5 33.2*  MCV 87.2 87.4  PLT 395 335    Cardiac Enzymes:  Recent Labs Lab 10/12/12 1736 10/12/12 2258 10/13/12 0513  TROPONINI <0.30 <0.30 <0.30    BNP:  Recent Labs  10/12/12 1736  PROBNP 337.6*    Echocardiogram: Study Conclusions  - Left ventricle: The cavity size was at the upper limits of normal. Wall thickness was normal. Systolic function was mildly to moderately reduced. The estimated ejection fraction was in the range of 40% to 45%. Diffuse hypokinesis. There is more prominent hypokinesis of the inferolateral myocardium. Features are consistent with a pseudonormal left ventricular filling pattern,  with concomitant abnormal relaxation and increased filling pressure (grade 2 diastolic dysfunction). - Mitral valve: Mildly thickened leaflets . Trivial regurgitation. - Left atrium: The atrium was at the upper limits of normal in size. - Right atrium: Central venous pressure: 3mm Hg (est). - Tricuspid valve: Mild regurgitation. - Pulmonary arteries: PA peak pressure: 28mm Hg (S). - Pericardium, extracardiac: A trivial pericardial effusion was identified. Impressions:  - No prior study for comparison. Upper normal LV chamber size with LVEF 40-45%, diffuse hypokinesis - most prominent in the inferolateral wall, grade 2 diastolic dysfunction. Upper normal left atrial size. Mild tricuspid regurgitation with PASP 28 mmHg. Trivial pericardial effusion.   Medications:   Scheduled Medications: . enoxaparin (LOVENOX) injection  40 mg Subcutaneous Q24H  . nicotine  21 mg Transdermal Daily  . pantoprazole  40 mg Oral Daily  . sodium chloride  3 mL Intravenous Q12H  . tamsulosin  0.4 mg Oral QHS    Infusions: . sodium chloride 75 mL/hr at 10/13/12 0316    PRN Medications: acetaminophen, acetaminophen, albuterol, guaiFENesin, morphine injection, ondansetron (ZOFRAN) IV, ondansetron, oxyCODONE, senna-docusate   Assessment and Plan:   1. Atypical Chest Pain: Troponin negative. ECG shows non-specific changes. No clear evidence of ACS.  2. COPD:  Coughing is better with expectorant. Continue to have chest discomfort on the left  axillary area. Requesting pain management on discharge. Will defer to Dr. Janna Arch.  3. Ongoing tobacco abuse: States he wants to go outside and smoke while waiting. I have discouraged this and recommend cessation.   4. Cardiomyopathy: Duration uncertain. No active CHF symptoms. Seems unlikely that his atypical left axillary discomfort is related. Possibility of underlying ischemic heart disease to be considered, however he does report a prior history of alcohol  abuse, also has episodes of PSVT by monitoring - nonischemic etiology is just as likely. Would recommend basic medical therapy for now.  Bettey Mare. Lyman Bishop NP [facility] 10/14/2012, 10:25 AM   Attending note:  Patient seen and examined. Modified above note by Ms. Lawrence NP. Mr. Dougher is very eager to go home today, states that his chest discomfort has improved. There is no clear evidence of ACS by enzymes. His echocardiogram does show evidence of cardiomyopathy with LVEF 40-45%. Duration of this finding is not clear, he has no active CHF symptoms. 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 Would suggest basic medical therapy, perhaps consider adding a low-dose ARB as an outpatient, hold off beta blocker with low resting heart rate and history of asthma/COPD, no clear need for diuretic at this time. We discussed smoking cessation. He can maintain followup with Dr. Janna Arch.   Jonelle Sidle, M.D., F.A.C.C.

## 2012-10-14 NOTE — Progress Notes (Signed)
[id number]

## 2012-10-14 NOTE — Progress Notes (Signed)
Patient left AMA. IV cath removed and intact. No pain/swelling at site.

## 2012-10-15 NOTE — Progress Notes (Signed)
NAME:  JUSITN, SALSGIVER NO.:  [id number]  MEDICAL RECORD NO.:  [id number]  LOCATION:  A313                          FACILITY:  [facility]  PHYSICIAN:  Melvyn Novas, MDDATE OF BIRTH:  September 02, 1950  DATE OF PROCEDURE: DATE OF DISCHARGE:  10/14/2012                                PROGRESS NOTE   The patient had septal infarct age undetermined per EKG.  Echo was done yesterday.  Results are not back or interpreted yet on the computer. Cardiology stated that there were no regional wall motion abnormalities, and further ischemic workup would not be pursued, so waiting this determination.  Blood pressure is 141/70, temperature 98, pulse 69 and regular, respiratory rate is 18, hemoglobin 10.9, creatinine 1.25.  Had lung resection previously.  No evidence of infiltrate at present.  Has some atypical possibly pleuritic sharp pain in left chest area and anterior chest and left axillary area.  Lungs show prolonged inspiratory and expiratory phase.  Scattered rhonchi.  The patient does have tobacco abuse.  Heart, regular rhythm.  No S3, S4.  No heaves, thrills, or rubs. Currently controlled on Flomax 0.4.  Nicotine patch.  Protonix 40 mg p.o. daily, as well as Proventil nebulizer and oxycodone 5 q.4h p.r.n. Plan right now is to wait results of echocardiogram to see if there are any regional wall motion abnormalities consistent with ischemic heart disease, and then pursue further course of action.  It is recommended by Cardiology.     Melvyn Novas, MD     RMD/MEDQ  D:  10/14/2012  T:  10/15/2012  Job:  [id number]

## 2012-10-24 ENCOUNTER — Emergency Department (HOSPITAL_COMMUNITY)
Admission: EM | Admit: 2012-10-24 | Discharge: 2012-10-24 | Disposition: A | Discharge status: Home / Self Care | Payer: Medicaid Other | Attending: Emergency Medicine | Admitting: Emergency Medicine

## 2012-10-24 ENCOUNTER — Encounter (HOSPITAL_COMMUNITY): Payer: Self-pay | Admitting: Emergency Medicine

## 2012-10-24 DIAGNOSIS — R300 Dysuria: Secondary | ICD-10-CM | POA: Insufficient documentation

## 2012-10-24 DIAGNOSIS — Z87440 Personal history of urinary (tract) infections: Secondary | ICD-10-CM | POA: Insufficient documentation

## 2012-10-24 DIAGNOSIS — Z79899 Other long term (current) drug therapy: Secondary | ICD-10-CM | POA: Insufficient documentation

## 2012-10-24 DIAGNOSIS — N342 Other urethritis: Secondary | ICD-10-CM | POA: Insufficient documentation

## 2012-10-24 DIAGNOSIS — M129 Arthropathy, unspecified: Secondary | ICD-10-CM | POA: Insufficient documentation

## 2012-10-24 DIAGNOSIS — J45909 Unspecified asthma, uncomplicated: Secondary | ICD-10-CM | POA: Insufficient documentation

## 2012-10-24 DIAGNOSIS — L0292 Furuncle, unspecified: Secondary | ICD-10-CM

## 2012-10-24 DIAGNOSIS — F17200 Nicotine dependence, unspecified, uncomplicated: Secondary | ICD-10-CM | POA: Insufficient documentation

## 2012-10-24 LAB — RPR: RPR Ser Ql: NONREACTIVE

## 2012-10-24 MED ORDER — LIDOCAINE HCL (PF) 1 % IJ SOLN
INTRAMUSCULAR | Status: AC
  Administered 2012-10-24: 2.5 mL
  Filled 2012-10-24: qty 5

## 2012-10-24 MED ORDER — AZITHROMYCIN 250 MG PO TABS
1000.0000 mg | ORAL_TABLET | Freq: Once | ORAL | Status: AC
  Administered 2012-10-24: 1000 mg via ORAL
  Filled 2012-10-24: qty 4

## 2012-10-24 MED ORDER — CEFTRIAXONE SODIUM 250 MG IJ SOLR
250.0000 mg | Freq: Once | INTRAMUSCULAR | Status: AC
  Administered 2012-10-24: 250 mg via INTRAMUSCULAR
  Filled 2012-10-24: qty 250

## 2012-10-24 NOTE — ED Notes (Signed)
Pt reports noticed a bump on his penis yesterday, burning with urination, and penile discharge.

## 2012-10-24 NOTE — ED Provider Notes (Signed)
CSN: [id number]     Arrival date & time 10/24/12  0715 History  This chart was scribed for Flint Melter, MD by Ardelia Mems, ED Scribe. This patient was seen in room APA04/[facility]04 and the patient's care was started at 7:40 AM.   Chief Complaint  Patient presents with  . SEXUALLY TRANSMITTED DISEASE    The history is provided by the patient. No language interpreter was used.    HPI Comments: James Cervantes is a 62 y.o. male who presents to the Emergency Department complaining of a penile lesion onset 2 days ago. He reports associated penile discharge and pain in his groin.Marland Kitchen He also states that his urine feels "warm", but states that it is not burning. He states that he has had gonorrhea in the past, and he states that this does not feel similar. He states that he is eating well. He states that he has only been with 1 long-standing sexual partner recently, and he states that he has not used condoms. He states that his partner recently began having similar symptoms, was seen in the ED, and told him she simply had a UTI. He denies fever or any other symptoms. He is a current every day smoker of 0.5 packs/day of 50 years.  PCP- Dr. Oval Linsey   Past Medical History  Diagnosis Date  . Asthma   . Emphysema   . Prostate disorder   . Hip pain   . Arthritis    Past Surgical History  Procedure Laterality Date  . Vascular surgery    . Lung surgery    . Abdominal surgery    . Hernia repair     Family History  Problem Relation Age of Onset  . Cancer Mother    History  Substance Use Topics  . Smoking status: Current Every Day Smoker -- 0.50 packs/day for 50 years  . Smokeless tobacco: Not on file  . Alcohol Use: No    Review of Systems  Constitutional: Negative for fever.  Genitourinary: Positive for dysuria ("warm"), discharge and genital sores. Negative for scrotal swelling and testicular pain.  All other systems reviewed and are negative.   Allergies  Review of patient's  allergies indicates no known allergies.  Home Medications   Current Outpatient Rx  Name  Route  Sig  Dispense  Refill  . albuterol-ipratropium (COMBIVENT) 18-103 MCG/ACT inhaler   Inhalation   Inhale 2 puffs into the lungs every 6 (six) hours as needed. Wheezing         . oxyCODONE-acetaminophen (TYLOX) 5-500 MG per capsule   Oral   Take 1 capsule by mouth at bedtime.         . Tamsulosin HCl (FLOMAX) 0.4 MG CAPS   Oral   Take 0.4 mg by mouth at bedtime.          Triage Vitals: BP 137/78  Pulse 68  Temp(Src) 97.5 F (36.4 C) (Oral)  Resp 18  Ht 5\' 6"  (1.676 m)  Wt 120 lb (54.432 kg)  BMI 19.38 kg/m2  SpO2 97%  Physical Exam  Nursing note and vitals reviewed. Constitutional: He is oriented to person, place, and time. He appears well-developed and well-nourished.  HENT:  Head: Normocephalic and atraumatic.  Right Ear: External ear normal.  Left Ear: External ear normal.  Eyes: Conjunctivae and EOM are normal. Pupils are equal, round, and reactive to light.  Neck: Normal range of motion and phonation normal. Neck supple.  Cardiovascular: Normal rate, regular rhythm, normal heart  sounds and intact distal pulses.   Pulmonary/Chest: Effort normal and breath sounds normal. He exhibits no bony tenderness.  Abdominal: Soft. Normal appearance. There is no tenderness.  Genitourinary:  Circumcised. The right distal lateral penis has a raised umbilicated lesion 4 mm in diameter that is slightly tender and slightly indurated. No bleeding or drainage. There is purulent urethral discharge. Scrotum has a single testicle that is non-tender and non-enlarged. Groin has small shoddy adenopathy bilaterally.   Musculoskeletal: Normal range of motion.  Neurological: He is alert and oriented to person, place, and time. No cranial nerve deficit or sensory deficit. He exhibits normal muscle tone. Coordination normal.  Skin: Skin is warm, dry and intact.  Psychiatric: He has a normal mood and  affect. His behavior is normal. Judgment and thought content normal.    ED Course  Procedures (including critical care time)  DIAGNOSTIC STUDIES: Oxygen Saturation is 97% on RA, normal by my interpretation.    COORDINATION OF CARE: Medications  cefTRIAXone (ROCEPHIN) injection 250 mg (250 mg Intramuscular Given 10/24/12 0846)  azithromycin (ZITHROMAX) tablet 1,000 mg (1,000 mg Oral Given 10/24/12 0846)  lidocaine (PF) (XYLOCAINE) 1 % injection (2.5 mLs  Given 10/24/12 0847)    Patient Vitals for the past 24 hrs:  BP Temp Temp src Pulse Resp SpO2 Height Weight  10/24/12 0727 137/78 mmHg 97.5 F (36.4 C) Oral 68 18 97 % 5\' 6"  (1.676 m) 120 lb (54.432 kg)   7:45 AM- Pt advised of plan for treatment and pt agrees.  Labs Review Labs Reviewed  GC/CHLAMYDIA PROBE AMP  RPR  HIV ANTIBODY (ROUTINE TESTING)    MDM   1. Urethritis   2. Furuncle      Urethral discharge, with penile lesion. The penile lesion, is most consistent with local inflammation, and/or furuncle. Since it is tender, I do not believe that it represents primary syphilis.  RPR screening has been ordered. The patient is sexually active and his partner has urogenital symptoms. Doubt systemic illness, or metabolic instability. He is stable for discharge.  Nursing Notes Reviewed/ Care Coordinated, and agree without changes. Applicable Imaging Reviewed.  Interpretation of Laboratory Data incorporated into ED treatment   Plan: Home Medications- no additional; Home Treatments and Observation- have partner checked and treated for STD, warm soaks, for penile furuncle; return here if the recommended treatment, does not improve the symptoms; Recommended follow up- STD clinic 1 week for test of cure    I personally performed the services described in this documentation, which was scribed in my presence. The recorded information has been reviewed and is accurate.   Flint Melter, MD 10/24/12 [phone]

## 2012-10-24 NOTE — ED Notes (Signed)
Patient given some water per RN approval.

## 2012-10-25 ENCOUNTER — Telehealth (HOSPITAL_COMMUNITY): Payer: Self-pay | Admitting: Emergency Medicine

## 2012-10-28 LAB — GC/CHLAMYDIA PROBE AMP
CT Probe RNA: UNDETERMINED
GC Probe RNA: UNDETERMINED

## 2012-11-13 NOTE — Discharge Summary (Signed)
[id number] 

## 2012-11-14 NOTE — Discharge Summary (Signed)
NAME:  James Cervantes, James Cervantes NO.:  [id number]  MEDICAL RECORD NO.:  [id number]  LOCATION:                                 FACILITY:  PHYSICIAN:  Melvyn Novas, MDDATE OF BIRTH:  [birth year]  DATE OF ADMISSION:  10/12/2012 DATE OF DISCHARGE:  10/14/2014LH                              DISCHARGE SUMMARY   The patient is a 62 year old black male with history of hepatitis C, tobacco abuse, COPD, BPH, prior lung surgery, came in with some atypical chest pain, who was advised to have admission due to EKG, which revealed a septal infarct, age undetermined; however, initial troponins were negative.  He was scheduled to have 2D echo, wall motion analysis as well as cycling of troponins.  Apparently, the patient signed out AMA and I never saw this patient in the hospital.  I am dictating this discharge summary and medical records document that he signed out AMA and I have no knowledge of him.  I think he will follow up in the office in 1 week's time according to some mention on the history and physical note.     Melvyn Novas, MD     RMD/MEDQ  D:  11/13/2012  T:  11/14/2012  Job:  [id number]

## 2013-02-06 ENCOUNTER — Ambulatory Visit (HOSPITAL_COMMUNITY)
Admission: RE | Admit: 2013-02-06 | Discharge: 2013-02-06 | Disposition: A | Discharge status: Home / Self Care | Payer: Medicaid Other | Source: Ambulatory Visit | Attending: Family Medicine | Admitting: Family Medicine

## 2013-02-06 ENCOUNTER — Other Ambulatory Visit (HOSPITAL_COMMUNITY): Payer: Self-pay | Admitting: Family Medicine

## 2013-02-06 DIAGNOSIS — M25512 Pain in left shoulder: Secondary | ICD-10-CM

## 2013-02-06 DIAGNOSIS — M1990 Unspecified osteoarthritis, unspecified site: Secondary | ICD-10-CM

## 2013-02-06 DIAGNOSIS — M25519 Pain in unspecified shoulder: Secondary | ICD-10-CM | POA: Insufficient documentation

## 2013-03-13 ENCOUNTER — Emergency Department (HOSPITAL_COMMUNITY)
Admission: EM | Admit: 2013-03-13 | Discharge: 2013-03-13 | Disposition: A | Discharge status: Home / Self Care | Payer: Medicaid Other | Attending: Emergency Medicine | Admitting: Emergency Medicine

## 2013-03-13 ENCOUNTER — Emergency Department (HOSPITAL_COMMUNITY): Payer: Medicaid Other

## 2013-03-13 ENCOUNTER — Encounter (HOSPITAL_COMMUNITY): Payer: Self-pay | Admitting: Emergency Medicine

## 2013-03-13 DIAGNOSIS — Z79899 Other long term (current) drug therapy: Secondary | ICD-10-CM | POA: Insufficient documentation

## 2013-03-13 DIAGNOSIS — M79609 Pain in unspecified limb: Secondary | ICD-10-CM | POA: Insufficient documentation

## 2013-03-13 DIAGNOSIS — F17200 Nicotine dependence, unspecified, uncomplicated: Secondary | ICD-10-CM | POA: Insufficient documentation

## 2013-03-13 DIAGNOSIS — M545 Low back pain, unspecified: Secondary | ICD-10-CM | POA: Insufficient documentation

## 2013-03-13 DIAGNOSIS — M549 Dorsalgia, unspecified: Secondary | ICD-10-CM

## 2013-03-13 DIAGNOSIS — J45909 Unspecified asthma, uncomplicated: Secondary | ICD-10-CM | POA: Insufficient documentation

## 2013-03-13 DIAGNOSIS — J438 Other emphysema: Secondary | ICD-10-CM | POA: Insufficient documentation

## 2013-03-13 DIAGNOSIS — Z87448 Personal history of other diseases of urinary system: Secondary | ICD-10-CM | POA: Insufficient documentation

## 2013-03-13 DIAGNOSIS — G8929 Other chronic pain: Secondary | ICD-10-CM | POA: Insufficient documentation

## 2013-03-13 DIAGNOSIS — Z8739 Personal history of other diseases of the musculoskeletal system and connective tissue: Secondary | ICD-10-CM | POA: Insufficient documentation

## 2013-03-13 LAB — URINALYSIS, ROUTINE W REFLEX MICROSCOPIC
BILIRUBIN URINE: NEGATIVE
Glucose, UA: NEGATIVE mg/dL
Hgb urine dipstick: NEGATIVE
Ketones, ur: NEGATIVE mg/dL
Leukocytes, UA: NEGATIVE
Nitrite: NEGATIVE
PH: 6 (ref 5.0–8.0)
Protein, ur: NEGATIVE mg/dL
Specific Gravity, Urine: 1.025 (ref 1.005–1.030)
UROBILINOGEN UA: 0.2 mg/dL (ref 0.0–1.0)

## 2013-03-13 MED ORDER — CYCLOBENZAPRINE HCL 10 MG PO TABS: 10.0000 mg | ORAL_TABLET | Freq: Three times a day (TID) | ORAL | Status: DC | PRN

## 2013-03-13 MED ORDER — DICLOFENAC SODIUM 75 MG PO TBEC: 75.0000 mg | DELAYED_RELEASE_TABLET | Freq: Two times a day (BID) | ORAL | Status: DC

## 2013-03-13 MED ORDER — KETOROLAC TROMETHAMINE 60 MG/2ML IM SOLN
60.0000 mg | Freq: Once | INTRAMUSCULAR | Status: AC
  Administered 2013-03-13: 60 mg via INTRAMUSCULAR
  Filled 2013-03-13: qty 2

## 2013-03-13 MED ORDER — DIAZEPAM 5 MG PO TABS
5.0000 mg | ORAL_TABLET | Freq: Once | ORAL | Status: AC
  Administered 2013-03-13: 5 mg via ORAL
  Filled 2013-03-13: qty 1
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
## 2013-03-13 NOTE — Discharge Instructions (Signed)
Back Pain, Adult Low back pain is very common. About 1 in 5 people have back pain.The cause of low back pain is rarely dangerous. The pain often gets better over time.About half of people with a sudden onset of back pain feel better in just 2 weeks. About 8 in 10 people feel better by 6 weeks.  CAUSES Some common causes of back pain include:  Strain of the muscles or ligaments supporting the spine.  Wear and tear (degeneration) of the spinal discs.  Arthritis.  Direct injury to the back. DIAGNOSIS Most of the time, the direct cause of low back pain is not known.However, back pain can be treated effectively even when the exact cause of the pain is unknown.Answering your caregiver's questions about your overall health and symptoms is one of the most accurate ways to make sure the cause of your pain is not dangerous. If your caregiver needs more information, he or she may order lab work or imaging tests (X-rays or MRIs).However, even if imaging tests show changes in your back, this usually does not require surgery. HOME CARE INSTRUCTIONS For many people, back pain returns.Since low back pain is rarely dangerous, it is often a condition that people can learn to manageon their own.   Remain active. It is stressful on the back to sit or stand in one place. Do not sit, drive, or stand in one place for more than 30 minutes at a time. Take short walks on level surfaces as soon as pain allows.Try to increase the length of time you walk each day.  Do not stay in bed.Resting more than 1 or 2 days can delay your recovery.  Do not avoid exercise or work.Your body is made to move.It is not dangerous to be active, even though your back may hurt.Your back will likely heal faster if you return to being active before your pain is gone.  Pay attention to your body when you bend and lift. Many people have less discomfortwhen lifting if they bend their knees, keep the load close to their bodies,and  avoid twisting. Often, the most comfortable positions are those that put less stress on your recovering back.  Find a comfortable position to sleep. Use a firm mattress and lie on your side with your knees slightly bent. If you lie on your back, put a pillow under your knees.  Only take over-the-counter or prescription medicines as directed by your caregiver. Over-the-counter medicines to reduce pain and inflammation are often the most helpful.Your caregiver may prescribe muscle relaxant drugs.These medicines help dull your pain so you can more quickly return to your normal activities and healthy exercise.  Put ice on the injured area.  Put ice in a plastic bag.  Place a towel between your skin and the bag.  Leave the ice on for 15-20 minutes, 03-04 times a day for the first 2 to 3 days. After that, ice and heat may be alternated to reduce pain and spasms.  Ask your caregiver about trying back exercises and gentle massage. This may be of some benefit.  Avoid feeling anxious or stressed.Stress increases muscle tension and can worsen back pain.It is important to recognize when you are anxious or stressed and learn ways to manage it.Exercise is a great option. SEEK MEDICAL CARE IF:  You have pain that is not relieved with rest or medicine.  You have pain that does not improve in 1 week.  You have new symptoms.  You are generally not feeling well. SEEK   IMMEDIATE MEDICAL CARE IF:   You have pain that radiates from your back into your legs.  You develop new bowel or bladder control problems.  You have unusual weakness or numbness in your arms or legs.  You develop nausea or vomiting.  You develop abdominal pain.  You feel faint. Document Released: 12/18/2004 Document Revised: 06/19/2011 Document Reviewed: 05/08/2010 ExitCare Patient Information 2014 [employer].  

## 2013-03-16 NOTE — ED Provider Notes (Signed)
Medical screening examination/treatment/procedure(s) were performed by non-physician practitioner and as supervising physician I was immediately available for consultation/collaboration.   EKG Interpretation None        Alfonzo Feller, DO 03/16/13 1821

## 2013-03-25 ENCOUNTER — Other Ambulatory Visit (HOSPITAL_COMMUNITY): Payer: Self-pay | Admitting: Family Medicine

## 2013-03-25 DIAGNOSIS — M545 Low back pain, unspecified: Secondary | ICD-10-CM

## 2013-03-31 ENCOUNTER — Ambulatory Visit (HOSPITAL_COMMUNITY)
Admission: RE | Admit: 2013-03-31 | Discharge: 2013-03-31 | Disposition: A | Discharge status: Home / Self Care | Payer: Medicaid Other | Source: Ambulatory Visit | Attending: Family Medicine | Admitting: Family Medicine

## 2013-03-31 DIAGNOSIS — M545 Low back pain, unspecified: Secondary | ICD-10-CM

## 2013-03-31 DIAGNOSIS — M48061 Spinal stenosis, lumbar region without neurogenic claudication: Secondary | ICD-10-CM | POA: Diagnosis not present

## 2013-03-31 DIAGNOSIS — M51379 Other intervertebral disc degeneration, lumbosacral region without mention of lumbar back pain or lower extremity pain: Secondary | ICD-10-CM | POA: Insufficient documentation

## 2013-03-31 DIAGNOSIS — M5137 Other intervertebral disc degeneration, lumbosacral region: Secondary | ICD-10-CM | POA: Insufficient documentation

## 2013-03-31 LAB — POCT I-STAT CREATININE: Creatinine, Ser: 1.1 mg/dL (ref 0.50–1.35)

## 2013-03-31 MED ORDER — IOHEXOL 300 MG/ML  SOLN
100.0000 mL | Freq: Once | INTRAMUSCULAR | Status: AC | PRN
  Administered 2013-03-31: 100 mL via INTRAVENOUS

## 2013-04-09 ENCOUNTER — Emergency Department (HOSPITAL_COMMUNITY): Payer: Medicaid Other

## 2013-04-09 ENCOUNTER — Emergency Department (HOSPITAL_COMMUNITY)
Admission: EM | Admit: 2013-04-09 | Discharge: 2013-04-09 | Discharge status: Against Medical Advice | Payer: Medicaid Other | Attending: Emergency Medicine | Admitting: Emergency Medicine

## 2013-04-09 ENCOUNTER — Other Ambulatory Visit: Payer: Self-pay

## 2013-04-09 ENCOUNTER — Encounter (HOSPITAL_COMMUNITY): Payer: Self-pay | Admitting: Emergency Medicine

## 2013-04-09 DIAGNOSIS — R5381 Other malaise: Secondary | ICD-10-CM | POA: Insufficient documentation

## 2013-04-09 DIAGNOSIS — R079 Chest pain, unspecified: Secondary | ICD-10-CM | POA: Insufficient documentation

## 2013-04-09 DIAGNOSIS — R5383 Other fatigue: Secondary | ICD-10-CM | POA: Insufficient documentation

## 2013-04-09 DIAGNOSIS — R05 Cough: Secondary | ICD-10-CM | POA: Insufficient documentation

## 2013-04-09 DIAGNOSIS — R059 Cough, unspecified: Secondary | ICD-10-CM | POA: Insufficient documentation

## 2013-04-09 MED ORDER — SODIUM CHLORIDE 0.9 % IV SOLN: INTRAVENOUS | Status: DC

## 2013-04-09 NOTE — ED Notes (Signed)
Unable to find pt

## 2013-04-09 NOTE — ED Notes (Addendum)
Pt decided to continue to wait.

## 2013-04-09 NOTE — ED Notes (Signed)
Pt not in room.

## 2013-04-09 NOTE — ED Notes (Signed)
Pt refusing to put gown on.  RN attempting to get IV access.  Pt states he has only one spot that can be stuck.  IV attempt times one without success. Pt arguing with RN about RN's IV skills.  Refusing IV at this time.

## 2013-04-09 NOTE — ED Notes (Signed)
Pt c/o productive cough with yellow sputum, chest pain, and generalized weakness x 1 month.  Denies fever.

## 2013-08-18 ENCOUNTER — Emergency Department (HOSPITAL_COMMUNITY)
Admission: EM | Admit: 2013-08-18 | Discharge: 2013-08-18 | Disposition: A | Discharge status: Home / Self Care | Payer: Medicaid Other | Attending: Emergency Medicine | Admitting: Emergency Medicine

## 2013-08-18 ENCOUNTER — Encounter (HOSPITAL_COMMUNITY): Payer: Self-pay | Admitting: Emergency Medicine

## 2013-08-18 DIAGNOSIS — N4889 Other specified disorders of penis: Secondary | ICD-10-CM | POA: Insufficient documentation

## 2013-08-18 DIAGNOSIS — L0390 Cellulitis, unspecified: Secondary | ICD-10-CM | POA: Diagnosis present

## 2013-08-18 DIAGNOSIS — R599 Enlarged lymph nodes, unspecified: Secondary | ICD-10-CM | POA: Diagnosis not present

## 2013-08-18 DIAGNOSIS — F17200 Nicotine dependence, unspecified, uncomplicated: Secondary | ICD-10-CM | POA: Diagnosis not present

## 2013-08-18 DIAGNOSIS — J45909 Unspecified asthma, uncomplicated: Secondary | ICD-10-CM | POA: Diagnosis not present

## 2013-08-18 DIAGNOSIS — L0291 Cutaneous abscess, unspecified: Secondary | ICD-10-CM | POA: Diagnosis present

## 2013-08-18 DIAGNOSIS — Z79899 Other long term (current) drug therapy: Secondary | ICD-10-CM | POA: Insufficient documentation

## 2013-08-18 DIAGNOSIS — N489 Disorder of penis, unspecified: Secondary | ICD-10-CM

## 2013-08-18 DIAGNOSIS — Z792 Long term (current) use of antibiotics: Secondary | ICD-10-CM | POA: Insufficient documentation

## 2013-08-18 MED ORDER — DOXYCYCLINE HYCLATE 100 MG PO CAPS: 100.0000 mg | ORAL_CAPSULE | Freq: Two times a day (BID) | ORAL | Status: DC

## 2013-08-18 MED ORDER — NAPROXEN 500 MG PO TABS
500.0000 mg | ORAL_TABLET | Freq: Two times a day (BID) | ORAL | Status: DC
Start: 2013-08-18 — End: 2016-04-20

## 2013-08-18 NOTE — ED Notes (Signed)
Pt c/o drainage from "bump" on penis that started three days ago,

## 2013-08-18 NOTE — Discharge Instructions (Signed)
Sexually Transmitted Disease A sexually transmitted disease (STD) is a disease or infection often passed to another person during sex. However, STDs can be passed through nonsexual ways. An STD can be passed through:  Spit (saliva).  Semen.  Blood.  Mucus from the vagina.  Pee (urine). HOW CAN I LESSEN MY CHANCES OF GETTING AN STD?  Use:  Latex condoms.  Water-soluble lubricants with condoms. Do not use petroleum jelly or oils.  Dental dams. These are small pieces of latex that are used as a barrier during oral sex.  Avoid having more than one sex partner.  Do not have sex with someone who has other sex partners.  Do not have sex with anyone you do not know or who is at high risk for an STD.  Avoid risky sex that can break your skin.  Do not have sex if you have open sores on your mouth or skin.  Avoid drinking too much alcohol or taking illegal drugs. Alcohol and drugs can affect your good judgment.  Avoid oral and anal sex acts.  Get shots (vaccines) for HPV and hepatitis.  If you are at risk of being infected with HIV, it is advised that you take a certain medicine daily to prevent HIV infection. This is called pre-exposure prophylaxis (PrEP). You may be at risk if:  You are a man who has sex with other men (MSM).  You are attracted to the opposite sex (heterosexual) and are having sex with more than one partner.  You take drugs with a needle.  You have sex with someone who has HIV.  Talk with your doctor about if you are at high risk of being infected with HIV. If you begin to take PrEP, get tested for HIV first. Get tested every 3 months for as long as you are taking PrEP. WHAT SHOULD I DO IF I THINK I HAVE AN STD?  See your doctor.  Tell your sex partner(s) that you have an STD. They should be tested and treated.  Do not have sex until your doctor says it is okay. WHEN SHOULD I GET HELP? Get help right away if:  You have bad belly (abdominal)  pain.  You are a man and have puffiness (swelling) or pain in your testicles.  You are a woman and have puffiness in your vagina. Document Released: 01/26/2004 Document Revised: 12/23/2012 Document Reviewed: 06/13/2012 ExitCare Patient Information 2015 [employer]. This information is not intended to replace advice given to you by your health care provider. Make sure you discuss any questions you have with your health care provider.  

## 2013-08-19 ENCOUNTER — Encounter (HOSPITAL_COMMUNITY): Payer: Self-pay | Admitting: Emergency Medicine

## 2013-08-19 ENCOUNTER — Emergency Department (HOSPITAL_COMMUNITY)
Admission: EM | Admit: 2013-08-19 | Discharge: 2013-08-19 | Discharge status: Against Medical Advice | Payer: Medicaid Other | Attending: Emergency Medicine | Admitting: Emergency Medicine

## 2013-08-19 DIAGNOSIS — H6120 Impacted cerumen, unspecified ear: Secondary | ICD-10-CM | POA: Insufficient documentation

## 2013-08-19 DIAGNOSIS — M129 Arthropathy, unspecified: Secondary | ICD-10-CM | POA: Diagnosis not present

## 2013-08-19 DIAGNOSIS — Z791 Long term (current) use of non-steroidal anti-inflammatories (NSAID): Secondary | ICD-10-CM | POA: Diagnosis not present

## 2013-08-19 DIAGNOSIS — J45909 Unspecified asthma, uncomplicated: Secondary | ICD-10-CM | POA: Insufficient documentation

## 2013-08-19 DIAGNOSIS — F17200 Nicotine dependence, unspecified, uncomplicated: Secondary | ICD-10-CM | POA: Diagnosis not present

## 2013-08-19 DIAGNOSIS — Z79899 Other long term (current) drug therapy: Secondary | ICD-10-CM | POA: Diagnosis not present

## 2013-08-19 DIAGNOSIS — H9209 Otalgia, unspecified ear: Secondary | ICD-10-CM | POA: Insufficient documentation

## 2013-08-19 DIAGNOSIS — Z87448 Personal history of other diseases of urinary system: Secondary | ICD-10-CM | POA: Insufficient documentation

## 2013-08-19 DIAGNOSIS — H6122 Impacted cerumen, left ear: Secondary | ICD-10-CM

## 2013-08-19 LAB — GC/CHLAMYDIA PROBE AMP
CT Probe RNA: POSITIVE — AB
GC Probe RNA: NEGATIVE

## 2013-08-19 LAB — RPR

## 2013-08-19 LAB — HIV ANTIBODY (ROUTINE TESTING W REFLEX): HIV 1&2 Ab, 4th Generation: NONREACTIVE

## 2013-08-19 MED ORDER — HYDROGEN PEROXIDE 3 % EX SOLN
Freq: Once | CUTANEOUS | Status: AC
  Administered 2013-08-19: 17:00:00 via TOPICAL

## 2013-08-19 MED ORDER — HYDROGEN PEROXIDE 3 % EX SOLN
CUTANEOUS | Status: AC
  Filled 2013-08-19: qty 473

## 2013-08-19 NOTE — ED Notes (Signed)
Pt states that it feels like his left ear is stopped up x 4 days. NAD.

## 2013-08-19 NOTE — ED Notes (Signed)
At 1500 patient to nursing station wanting to leave because of his room being "too cold" patient with syringe and bottle of peroxide saying he "could do this at home, himself". Evalee Jefferson, PA specifically told patient not to irrigate his own ear with peroxide due to risk of rupture, infection and other risks. Patient went back in his room at that time.  At this time 1505. I went in pts room to give him a blanket, patient leaned over sink with peroxide irrigating his own ear, I reinforced what he was told by Evalee Jefferson, however patient would not stop. Took peroxide from room, however patient would not give me the syringe.

## 2013-08-19 NOTE — Discharge Instructions (Signed)
Cerumen Impaction °A cerumen impaction is when the wax in your ear forms a plug. This plug usually causes reduced hearing. Sometimes it also causes an earache or dizziness. Removing a cerumen impaction can be difficult and painful. The wax sticks to the ear canal. The canal is sensitive and bleeds easily. If you try to remove a heavy wax buildup with a cotton tipped swab, you may push it in further. °Irrigation with water, suction, and small ear curettes may be used to clear out the wax. If the impaction is fixed to the skin in the ear canal, ear drops may be needed for a few days to loosen the wax. People who build up a lot of wax frequently can use ear wax removal products available in your local drugstore. °SEEK MEDICAL CARE IF:  °You develop an earache, increased hearing loss, or marked dizziness. °Document Released: 01/26/2004 Document Revised: 03/12/2011 Document Reviewed: 03/17/2009 °ExitCare® Patient Information ©2015 [employer]. This information is not intended to replace advice given to you by your health care provider. Make sure you discuss any questions you have with your health care provider. ° °

## 2013-08-19 NOTE — ED Provider Notes (Signed)
CSN: [id number]     Arrival date & time 08/19/13  1433 History   First MD Initiated Contact with Patient 08/19/13 1629     Chief Complaint  Patient presents with  . Ear Fullness     (Consider location/radiation/quality/duration/timing/severity/associated sxs/prior Treatment) HPI  James Cervantes is a 63 y.o. male presenting with left ear pain and pressure along with decreased hearing acuity.  His symptoms have been present for "awhile".  He was here yesterday for penile discharge but forgot to mention his ear complaint.  He denies fevers or chills, there has been no drainage from the ear.  Additionally he denies nasal congestion, headache, cough, sore throat.  He has found no alleviators for this condition.    Past Medical History  Diagnosis Date  . Asthma   . Emphysema   . Prostate disorder   . Hip pain   . Arthritis    Past Surgical History  Procedure Laterality Date  . Vascular surgery    . Lung surgery    . Abdominal surgery    . Hernia repair     Family History  Problem Relation Age of Onset  . Cancer Mother    History  Substance Use Topics  . Smoking status: Current Every Day Smoker -- 0.50 packs/day for 50 years  . Smokeless tobacco: Not on file  . Alcohol Use: No    Review of Systems  Constitutional: Negative for fever, chills and fatigue.  HENT: Positive for ear pain and hearing loss. Negative for congestion, rhinorrhea, sinus pressure, sore throat, trouble swallowing and voice change.   Eyes: Negative for discharge.  Respiratory: Negative for cough, shortness of breath, wheezing and stridor.   Cardiovascular: Negative for chest pain.  Gastrointestinal: Negative for abdominal pain.  Genitourinary: Negative.       Allergies  Review of patient's allergies indicates no known allergies.  Home Medications   Prior to Admission medications   Medication Sig Start Date End Date Taking? Authorizing Provider  albuterol (PROAIR HFA) 108 (90 BASE) MCG/ACT inhaler  Inhale 2 puffs into the lungs every 6 (six) hours as needed for wheezing or shortness of breath.   Yes Historical Provider, MD  doxycycline (VIBRAMYCIN) 100 MG capsule Take 1 capsule (100 mg total) by mouth 2 (two) times daily. For 10 days 08/18/13  Yes Tammy L. Triplett, PA-C  naproxen (NAPROSYN) 500 MG tablet Take 1 tablet (500 mg total) by mouth 2 (two) times daily. 08/18/13  Yes Tammy L. Triplett, PA-C  Oxycodone HCl 10 MG TABS Take 20 mg by mouth at bedtime. Takes for hip pain   Yes Historical Provider, MD   BP 136/80  Pulse 62  Temp(Src) 97.9 F (36.6 C) (Oral)  Resp 16  SpO2 100% Physical Exam  Constitutional: He is oriented to person, place, and time. He appears well-developed and well-nourished.  HENT:  Head: Normocephalic and atraumatic.  Right Ear: Tympanic membrane and ear canal normal.  Nose: No mucosal edema or rhinorrhea.  Mouth/Throat: Uvula is midline, oropharynx is clear and moist and mucous membranes are normal. No oropharyngeal exudate, posterior oropharyngeal edema, posterior oropharyngeal erythema or tonsillar abscesses.  Cerumen impaction left ear.  Eyes: Conjunctivae are normal.  Cardiovascular: Normal rate and normal heart sounds.   Pulmonary/Chest: Effort normal. No respiratory distress. He has no wheezes. He has no rales.  Abdominal: Soft. There is no tenderness.  Musculoskeletal: Normal range of motion.  Neurological: He is alert and oriented to person, place, and time.  Skin: Skin  is warm and dry. No rash noted.  Psychiatric: He has a normal mood and affect.    ED Course  Procedures (including critical care time) Labs Review Labs Reviewed - No data to display  Imaging Review No results found.   EKG Interpretation None      MDM   Final diagnoses:  Cerumen impaction, left   Pt was treated with peroxide in the ear,  Flushed x 1 with no results.  Pt was tired of waiting and wanted to leave with syringe and peroxide so he could treat himself at  home.  This was not advised.  Pt became angry and left the dept.    Evalee Jefferson, PA-C 08/19/13 1718

## 2013-08-19 NOTE — ED Notes (Signed)
Pt to nursing station, saying he is "leaving", explained to patient that it would be against medical advice, patient left refusing to sign out.

## 2013-08-20 ENCOUNTER — Telehealth (HOSPITAL_BASED_OUTPATIENT_CLINIC_OR_DEPARTMENT_OTHER): Payer: Self-pay | Admitting: Emergency Medicine

## 2013-08-20 NOTE — ED Provider Notes (Signed)
CSN: [id number]     Arrival date & time 08/18/13  1253 History   First MD Initiated Contact with Patient 08/18/13 1313     Chief Complaint  Patient presents with  . Abscess     (Consider location/radiation/quality/duration/timing/severity/associated sxs/prior Treatment) The history is provided by the patient.  James Cervantes is a 63 y.o. male who presents to the Emergency Department complaining of a "bump" to the shaft of his penis that began three days ago.  He states it began as a closed blister that opened and has now become "hard around it" and painful.  He denies fever, chills, rash, abdominal pain, nausea, vomiting , dysuria or pain/discharge from his penis.    Past Medical History  Diagnosis Date  . Asthma   . Emphysema   . Prostate disorder   . Hip pain   . Arthritis    Past Surgical History  Procedure Laterality Date  . Vascular surgery    . Lung surgery    . Abdominal surgery    . Hernia repair     Family History  Problem Relation Age of Onset  . Cancer Mother    History  Substance Use Topics  . Smoking status: Current Every Day Smoker -- 0.50 packs/day for 50 years  . Smokeless tobacco: Not on file  . Alcohol Use: No    Review of Systems  Constitutional: Negative for fever and chills.  Gastrointestinal: Negative for nausea, vomiting and abdominal pain.  Genitourinary: Positive for genital sores. Negative for dysuria, urgency, hematuria, discharge, penile swelling, scrotal swelling, difficulty urinating and testicular pain.  Musculoskeletal: Negative for arthralgias and joint swelling.  Skin: Negative for rash.  Hematological: Negative for adenopathy.  All other systems reviewed and are negative.     Allergies  Review of patient's allergies indicates no known allergies.  Home Medications   Prior to Admission medications   Medication Sig Start Date End Date Taking? Authorizing Provider  albuterol (PROAIR HFA) 108 (90 BASE) MCG/ACT inhaler Inhale 2  puffs into the lungs every 6 (six) hours as needed for wheezing or shortness of breath.   Yes Historical Provider, MD  Oxycodone HCl 10 MG TABS Take 20 mg by mouth at bedtime. Takes for hip pain   Yes Historical Provider, MD  doxycycline (VIBRAMYCIN) 100 MG capsule Take 1 capsule (100 mg total) by mouth 2 (two) times daily. For 10 days 08/18/13   Moksh Loomer L. Delrae Hagey, PA-C  naproxen (NAPROSYN) 500 MG tablet Take 1 tablet (500 mg total) by mouth 2 (two) times daily. 08/18/13   Marolyn Urschel L. Jaskaran Dauzat, PA-C   BP 125/76  Pulse 87  Temp(Src) 98.2 F (36.8 C) (Oral)  SpO2 98% Physical Exam  Nursing note and vitals reviewed. Constitutional: He is oriented to person, place, and time. He appears well-developed and well-nourished. No distress.  HENT:  Head: Normocephalic and atraumatic.  Cardiovascular: Normal rate, regular rhythm and normal heart sounds.   No murmur heard. Pulmonary/Chest: Effort normal and breath sounds normal. No respiratory distress.  Abdominal: Soft. He exhibits no distension and no mass. There is no tenderness. There is no rebound and no guarding.  Genitourinary: Testes normal. Cremasteric reflex is present. Right testis shows no mass, no swelling and no tenderness. Left testis shows no mass, no swelling and no tenderness. Circumcised. No paraphimosis, penile erythema or penile tenderness. No discharge found.  2 mm open , flat lesion to the shaft of the penis near the base.  No drainage, mild induration.  No  fluctuance or surrounding erythema.    Musculoskeletal: Normal range of motion. He exhibits no edema.  Lymphadenopathy:       Right: Inguinal adenopathy present.  Neurological: He is alert and oriented to person, place, and time. He exhibits normal muscle tone. Coordination normal.  Skin: Skin is warm and dry. No rash noted. No erythema.    ED Course  Procedures (including critical care time) Labs Review Labs Reviewed  GC/CHLAMYDIA PROBE AMP - Abnormal; Notable for the  following:    CT Probe RNA POSITIVE (*)    All other components within normal limits  CULTURE, ROUTINE-ABSCESS  RPR  HIV ANTIBODY (ROUTINE TESTING)    Imaging Review No results found.   EKG Interpretation None      MDM   Final diagnoses:  Penile lesion    pt is well appearing.  Non-toxic.  Small open lesion to shaft of penis , possible folliculitis, but early chancre also considered.  Will treat with doxy and cultures pending.  Pt advised that he will be contacted if cultures are positive as additional abx may be indicated.  He agrees to plan and appears stable for d/c    Jase Himmelberger L. Vanessa [place]uga, PA-C 08/20/13 2140

## 2013-08-20 NOTE — Telephone Encounter (Signed)
Post ED Visit - Positive Culture Follow-up  Culture report reviewed by antimicrobial stewardship pharmacist: []  Wes [place]more, Pharm.D., BCPS []  Heide Guile, Pharm.D., BCPS []  Alycia Rossetti, Pharm.D., BCPS []  [place], Pharm.D., BCPS, AAHIVP []  Legrand Como, Pharm.D., BCPS, AAHIVP []  Hassie Bruce, Pharm.D. []  Milus Glazier, [place].D.  Positive chlamydia culture Treated with doxycycline 100mg  bid x 10 days, organism sensitive to the same and no further patient follow-up is required at this time.  Hazle Nordmann 08/20/2013, 10:04 AM

## 2013-08-21 ENCOUNTER — Telehealth (HOSPITAL_BASED_OUTPATIENT_CLINIC_OR_DEPARTMENT_OTHER): Payer: Self-pay

## 2013-08-21 NOTE — Progress Notes (Signed)
ED Antimicrobial Stewardship Positive Culture Follow Up   James Cervantes is an 63 y.o. male who presented to [facility] on 08/19/2013 with a chief complaint of  Chief Complaint  Patient presents with  . Ear Fullness    Recent Results (from the past 720 hour(s))  CULTURE, ROUTINE-ABSCESS     Status: None   Collection Time    08/18/13  2:02 PM      Result Value Ref Range Status   Specimen Description ABSCESS PENILE SHAFT   Final   Special Requests NONE   Final   Gram Stain     Final   Value: NO WBC SEEN     NO SQUAMOUS EPITHELIAL CELLS SEEN     RARE GRAM POSITIVE COCCI     IN PAIRS     Performed at [facility]   Culture     Final   Value: ABUNDANT GROUP A STREP (S.PYOGENES) ISOLATED     Performed at [facility]   Report Status PENDING   Incomplete  GC/CHLAMYDIA PROBE AMP     Status: Abnormal   Collection Time    08/18/13  2:02 PM      Result Value Ref Range Status   CT Probe RNA POSITIVE (*) NEGATIVE Final   Comment: (NOTE)     A Positive CT or NG Nucleic Acid Amplification Test (NAAT) result     should be considered presumptive evidence of infection.  The result     should be evaluated along with physical examination and other     diagnostic findings.   GC Probe RNA NEGATIVE  NEGATIVE Final   Comment: (NOTE)                                                                                               **Normal Reference Range: Negative**          Assay performed using the Gen-Probe APTIMA COMBO2 (R) Assay.     Acceptable specimen types for this assay include APTIMA Swabs (Unisex,     endocervical, urethral, or vaginal), first void urine, and ThinPrep     liquid based cytology samples.     Performed at [facility]   [x]  Treated with doxycycline 100 mg BID x 10 days for chlamydia.  In additionn, abscess came back positive for group a strep.  Will add new antibiotic.    New antibiotic prescription: Amoxicillin 500 mg tablets by mouth twice daily for  10 days.  ED Provider: Veronda Prude 08/21/2013, 11:41 AM Infectious Diseases Pharmacist Phone# [phone]

## 2013-08-21 NOTE — ED Provider Notes (Signed)
Medical screening examination/treatment/procedure(s) were performed by non-physician practitioner and as supervising physician I was immediately available for consultation/collaboration.   EKG Interpretation None        Merryl Hacker, MD 08/21/13 1447

## 2013-08-21 NOTE — Telephone Encounter (Signed)
Post ED Visit - Positive Culture Follow-up: Successful Patient Follow-Up  Culture assessed and recommendations reviewed by: []  Wes [place], Pharm.D., BCPS []  Heide Guile, Pharm.D., BCPS []  Alycia Rossetti, Pharm.D., BCPS []  [place], Pharm.D., BCPS, AAHIVP []  Legrand Como, Pharm.D., BCPS, AAHIVP [x]  Hassie Bruce, Pharm.D. []  Milus Glazier, Pharm.D.  Positive Abscess culture, Penis -> abundant Group A Strep  [x]  Patient discharged without antimicrobial prescription and treatment is now indicated []  Organism is resistant to prescribed ED discharge antimicrobial []  Patient with positive blood cultures  Changes discussed with ED provider: A. Harris PA  New antibiotic prescription "Amoxicillin 500 mg po BID x 10 days" Called to [place] [phone] and left on VM.  Contacted patient, date 08/21/2013, time 18:38 ID verified x 2.  Pt informed.   James Cervantes 08/21/2013, 7:10 PM

## 2013-08-21 NOTE — ED Provider Notes (Signed)
Medical screening examination/treatment/procedure(s) were performed by non-physician practitioner and as supervising physician I was immediately available for consultation/collaboration.   EKG Interpretation None        Fredia Sorrow, MD 08/21/13 1551

## 2013-08-22 LAB — CULTURE, ROUTINE-ABSCESS: GRAM STAIN: NONE SEEN

## 2014-01-17 ENCOUNTER — Emergency Department (HOSPITAL_COMMUNITY)
Admission: EM | Admit: 2014-01-17 | Discharge: 2014-01-17 | Disposition: A | Discharge status: Home / Self Care | Payer: Medicaid Other | Attending: Emergency Medicine | Admitting: Emergency Medicine

## 2014-01-17 ENCOUNTER — Encounter (HOSPITAL_COMMUNITY): Payer: Self-pay | Admitting: *Deleted

## 2014-01-17 DIAGNOSIS — J45909 Unspecified asthma, uncomplicated: Secondary | ICD-10-CM | POA: Insufficient documentation

## 2014-01-17 DIAGNOSIS — R21 Rash and other nonspecific skin eruption: Secondary | ICD-10-CM | POA: Insufficient documentation

## 2014-01-17 DIAGNOSIS — M1990 Unspecified osteoarthritis, unspecified site: Secondary | ICD-10-CM | POA: Insufficient documentation

## 2014-01-17 DIAGNOSIS — Z87438 Personal history of other diseases of male genital organs: Secondary | ICD-10-CM | POA: Insufficient documentation

## 2014-01-17 DIAGNOSIS — B354 Tinea corporis: Secondary | ICD-10-CM

## 2014-01-17 DIAGNOSIS — J439 Emphysema, unspecified: Secondary | ICD-10-CM | POA: Diagnosis not present

## 2014-01-17 DIAGNOSIS — Z720 Tobacco use: Secondary | ICD-10-CM | POA: Diagnosis not present

## 2014-01-17 MED ORDER — TERBINAFINE HCL 1 % EX CREA: 1.0000 "application " | TOPICAL_CREAM | Freq: Two times a day (BID) | CUTANEOUS | Status: DC

## 2014-01-17 NOTE — ED Provider Notes (Signed)
CSN: [id number]     Arrival date & time 01/17/14  1316 History  This chart was scribed for a non-physician practitioner, Noland Fordyce, PA-C working with Nat Christen, MD by Martinique Peace, ED Scribe. The patient was seen in APFT23/APFT23. The patient's care was started at 1:51 PM.    Chief Complaint  Patient presents with  . Rash      Patient is a 64 y.o. male presenting with rash. The history is provided by the patient. No language interpreter was used.  Rash Location:  Shoulder/arm Shoulder/arm rash location:  L forearm Quality: itchiness   Duration:  2 months Timing:  Constant Progression:  Unchanged Context: not exposure to similar rash and not new detergent/soap   Relieved by:  Nothing Ineffective treatments:  Antibiotic cream Associated symptoms: no fever, no nausea and not vomiting     HPI Comments: James Cervantes is a 64 y.o. male who presents to the Emergency Department complaining of pruritic constant rash onset 2 months ago to lateral aspect of left forearm. No complaints of fever, nausea, or vomiting. He denies trying any new soaps or lotions recently. Pt states that his PCP has tried giving him different creams to try without any improvement. He does not recall name of medication but does state hydrocortisone and triamcinolone sound familiar. Pt is current everyday smoker (0.5 packs/day).    Past Medical History  Diagnosis Date  . Asthma   . Emphysema   . Prostate disorder   . Hip pain   . Arthritis    Past Surgical History  Procedure Laterality Date  . Vascular surgery    . Lung surgery    . Abdominal surgery    . Hernia repair     Family History  Problem Relation Age of Onset  . Cancer Mother    History  Substance Use Topics  . Smoking status: Current Every Day Smoker -- 0.50 packs/day for 50 years  . Smokeless tobacco: Not on file  . Alcohol Use: No    Review of Systems  Constitutional: Negative for fever.  Gastrointestinal: Negative for nausea and  vomiting.  Skin: Positive for rash.  All other systems reviewed and are negative.     Allergies  Review of patient's allergies indicates no known allergies.  Home Medications   Prior to Admission medications   Medication Sig Start Date End Date Taking? Authorizing Provider  albuterol (PROAIR HFA) 108 (90 BASE) MCG/ACT inhaler Inhale 2 puffs into the lungs every 6 (six) hours as needed for wheezing or shortness of breath.    Historical Provider, MD  doxycycline (VIBRAMYCIN) 100 MG capsule Take 1 capsule (100 mg total) by mouth 2 (two) times daily. For 10 days 08/18/13   Tammy L. Triplett, PA-C  naproxen (NAPROSYN) 500 MG tablet Take 1 tablet (500 mg total) by mouth 2 (two) times daily. 08/18/13   Tammy L. Triplett, PA-C  Oxycodone HCl 10 MG TABS Take 20 mg by mouth at bedtime. Takes for hip pain    Historical Provider, MD  terbinafine (LAMISIL) 1 % cream Apply 1 application topically 2 (two) times daily. 01/17/14   Noland Fordyce, PA-C   BP 139/76 mmHg  Pulse 72  Temp(Src) 98.1 F (36.7 C) (Oral)  Resp 16  SpO2 100% Physical Exam  Constitutional: He is oriented to person, place, and time. He appears well-developed and well-nourished.  HENT:  Head: Normocephalic and atraumatic.  Eyes: EOM are normal.  Neck: Normal range of motion.  Cardiovascular: Normal rate.  Pulmonary/Chest: Effort normal.  Musculoskeletal: Normal range of motion.  Neurological: He is alert and oriented to person, place, and time.  Skin: Skin is warm and dry. Rash noted. No erythema.  Left forearm: dry, plaque-like rash, 2-4cm in area.   No induration or fluctuance. No discharge.  Psychiatric: He has a normal mood and affect. His behavior is normal.  Nursing note and vitals reviewed.   ED Course  Procedures (including critical care time) Labs Review Labs Reviewed - No data to display  Imaging Review No results found.   EKG Interpretation None     Medications - No data to display  1:54 PM-  Treatment plan was discussed with patient who verbalizes understanding and agrees.   MDM   Final diagnoses:  Tinea corporis  Rash, skin    Rash on left forearm x73mo w/o relief of possible steroid cream provided by PCP.  Rash c/w tinea corporis. No evidence of underlying infection. Will tx with terbinafine cream. Home care instructions provided. Advised to f/u with PCP for recheck, may need referral to dermatology if not improving. Return precautions provided. Pt verbalized understanding and agreement with tx plan.   I personally performed the services described in this documentation, which was scribed in my presence. The recorded information has been reviewed and is accurate.   Noland Fordyce, PA-C 01/17/14 Lee Mont, MD 01/19/14 1356

## 2014-01-17 NOTE — ED Notes (Signed)
Pt states rash to left forearm x 2 months. NAD

## 2014-03-19 IMAGING — CR DG CHEST 2V
2 series · 2 of 2 positions shown · non-contrast
Comparison: 10/12/2012 at [DATE] a.m.

CLINICAL DATA: Nipple markers for comparison.

EXAM:
CHEST  2 VIEW

[view not recorded (1 of 2)]
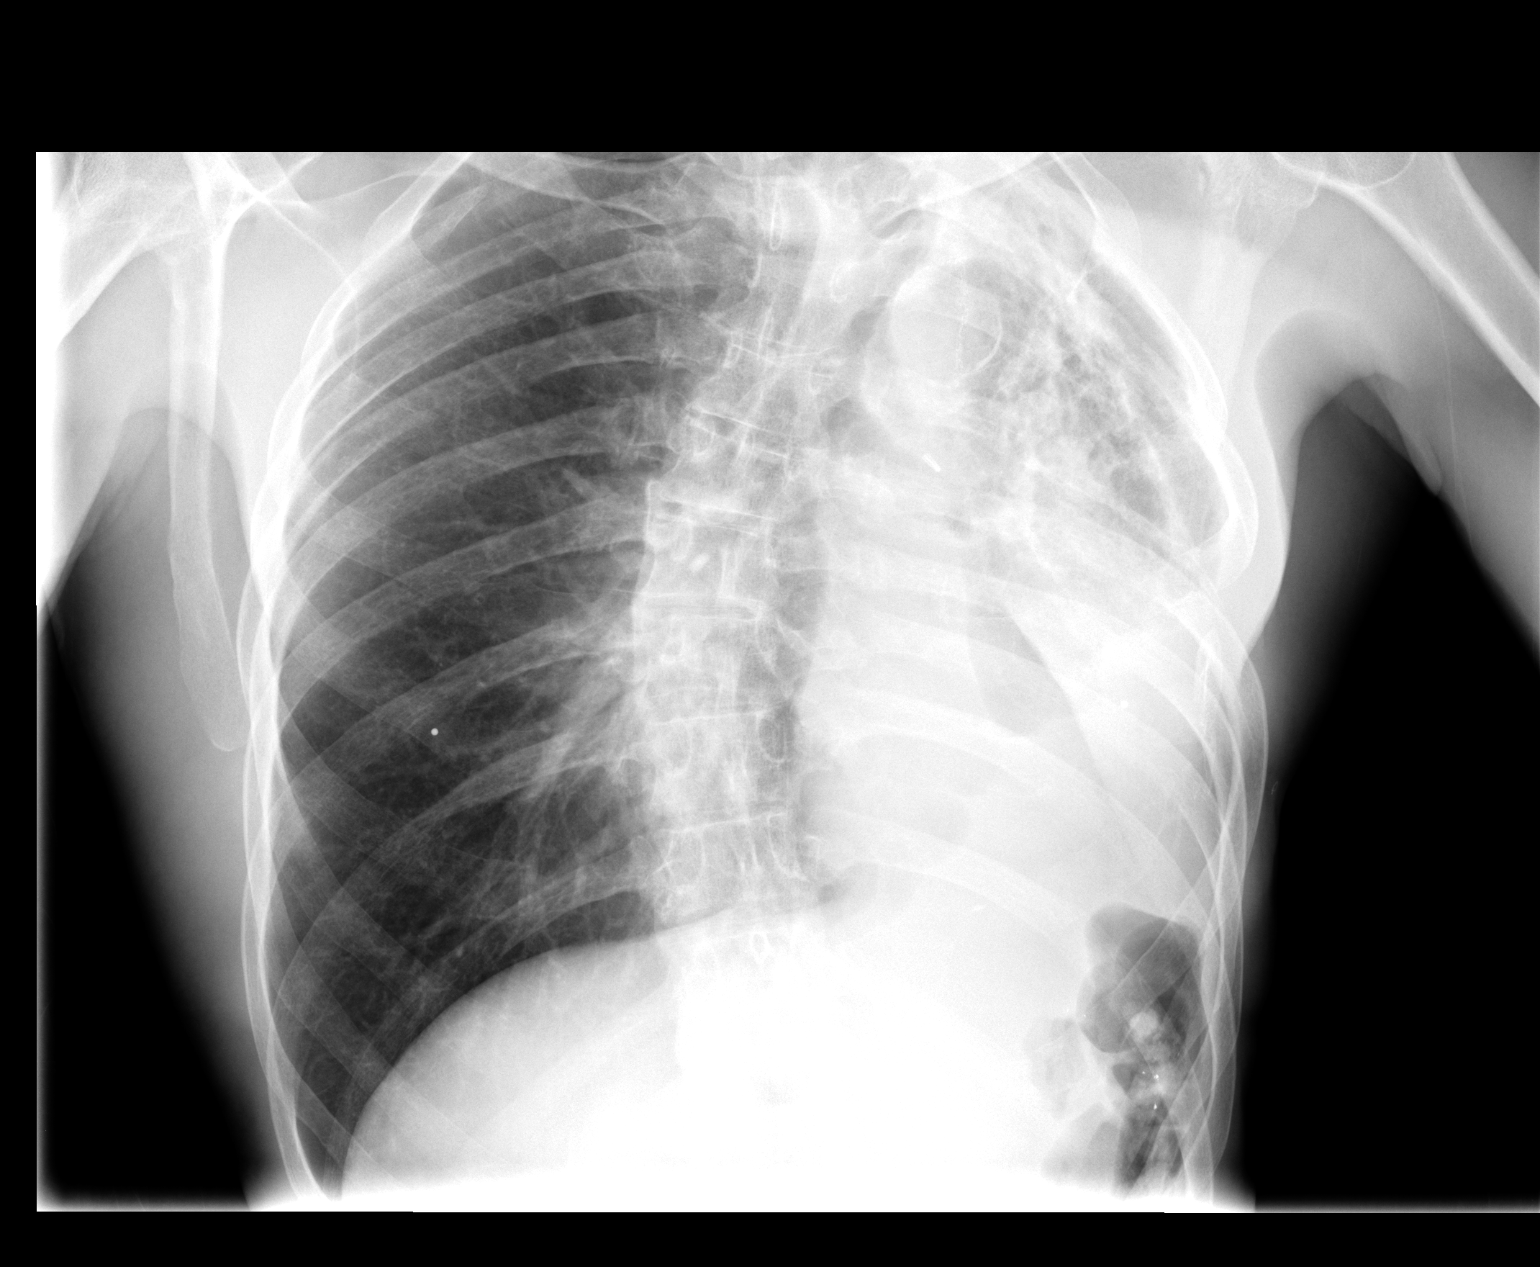

[view not recorded (2 of 2)]
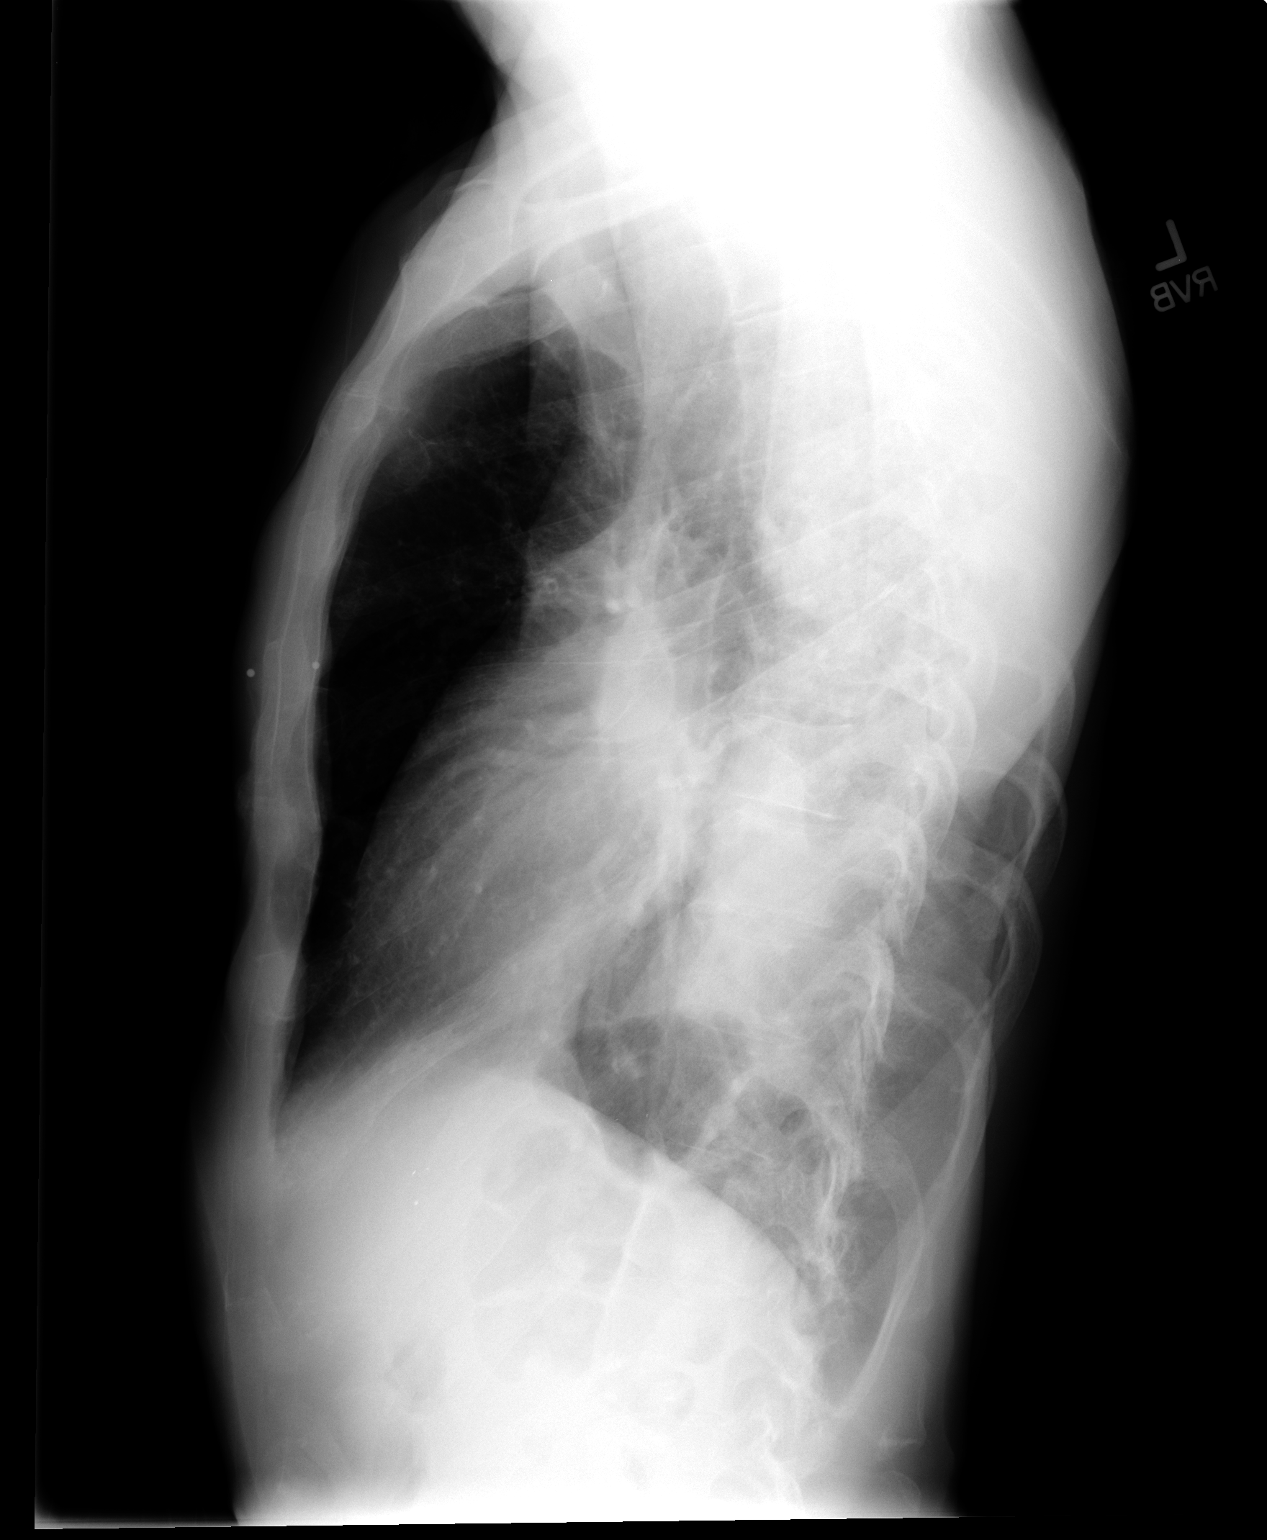

[2 of 2 positions shown; findings below may reference images not displayed]

FINDINGS: The nodular shadow seen on the prior study is not evident, but most
likely was due to a nipple shadow. The shadow created by the nipple
on the current exam is minimal.

Extensive consolidation volume loss on the left is unchanged. The
right lung remains hyperexpanded. No pneumothorax. No right pleural
effusion.
IMPRESSION: Nodule suggested previously presumably was a nipple shadow. It is no
longer evident. Exam otherwise stable from the earlier study.

## 2014-03-23 ENCOUNTER — Ambulatory Visit (INDEPENDENT_AMBULATORY_CARE_PROVIDER_SITE_OTHER): Payer: Medicaid Other | Admitting: Urology

## 2014-03-23 DIAGNOSIS — N529 Male erectile dysfunction, unspecified: Secondary | ICD-10-CM | POA: Diagnosis not present

## 2014-03-23 DIAGNOSIS — N401 Enlarged prostate with lower urinary tract symptoms: Secondary | ICD-10-CM | POA: Diagnosis not present

## 2014-05-10 ENCOUNTER — Emergency Department (HOSPITAL_COMMUNITY)
Admission: EM | Admit: 2014-05-10 | Discharge: 2014-05-10 | Disposition: A | Discharge status: Home / Self Care | Payer: Medicaid Other | Attending: Emergency Medicine | Admitting: Emergency Medicine

## 2014-05-10 ENCOUNTER — Encounter (HOSPITAL_COMMUNITY): Payer: Self-pay | Admitting: Cardiology

## 2014-05-10 DIAGNOSIS — J45909 Unspecified asthma, uncomplicated: Secondary | ICD-10-CM | POA: Diagnosis not present

## 2014-05-10 DIAGNOSIS — Z720 Tobacco use: Secondary | ICD-10-CM | POA: Insufficient documentation

## 2014-05-10 DIAGNOSIS — Z79899 Other long term (current) drug therapy: Secondary | ICD-10-CM | POA: Insufficient documentation

## 2014-05-10 DIAGNOSIS — N342 Other urethritis: Secondary | ICD-10-CM

## 2014-05-10 DIAGNOSIS — R369 Urethral discharge, unspecified: Secondary | ICD-10-CM | POA: Diagnosis present

## 2014-05-10 DIAGNOSIS — Z8739 Personal history of other diseases of the musculoskeletal system and connective tissue: Secondary | ICD-10-CM | POA: Insufficient documentation

## 2014-05-10 LAB — URINALYSIS, ROUTINE W REFLEX MICROSCOPIC
BILIRUBIN URINE: NEGATIVE
Glucose, UA: NEGATIVE mg/dL
Ketones, ur: NEGATIVE mg/dL
NITRITE: NEGATIVE
Protein, ur: NEGATIVE mg/dL
Specific Gravity, Urine: 1.025 (ref 1.005–1.030)
UROBILINOGEN UA: 0.2 mg/dL (ref 0.0–1.0)
pH: 6.5 (ref 5.0–8.0)

## 2014-05-10 LAB — URINE MICROSCOPIC-ADD ON

## 2014-05-10 MED ORDER — AZITHROMYCIN 250 MG PO TABS
1000.0000 mg | ORAL_TABLET | Freq: Once | ORAL | Status: AC
  Administered 2014-05-10: 1000 mg via ORAL
  Filled 2014-05-10: qty 4

## 2014-05-10 MED ORDER — LIDOCAINE HCL (PF) 1 % IJ SOLN
INTRAMUSCULAR | Status: AC
  Administered 2014-05-10: 1.5 mL
  Filled 2014-05-10: qty 5

## 2014-05-10 MED ORDER — CEFTRIAXONE SODIUM 250 MG IJ SOLR
250.0000 mg | Freq: Once | INTRAMUSCULAR | Status: AC
  Administered 2014-05-10: 250 mg via INTRAMUSCULAR
  Filled 2014-05-10: qty 250

## 2014-05-10 NOTE — Discharge Instructions (Signed)
Urethritis °Urethritis is an inflammation of the tube through which urine exits your bladder (urethra).  °CAUSES °Urethritis is often caused by an infection in your urethra. The infection can be viral, like herpes. The infection can also be bacterial, like gonorrhea. °RISK FACTORS °Risk factors of urethritis include: °· Having sex without using a condom. °· Having multiple sexual partners. °· Having poor hygiene. °SIGNS AND SYMPTOMS °Symptoms of urethritis are less noticeable in women than in men. These symptoms include: °· Burning feeling when you urinate (dysuria). °· Discharge from your urethra. °· Blood in your urine (hematuria). °· Urinating more than usual. °DIAGNOSIS  °To confirm a diagnosis of urethritis, your health care provider will do the following: °· Ask about your sexual history. °· Perform a physical exam. °· Have you provide a sample of your urine for lab testing. °· Use a cotton swab to gently collect a sample from your urethra for lab testing. °TREATMENT  °It is important to treat urethritis. Depending on the cause, untreated urethritis may lead to serious genital infections and possibly infertility. Urethritis caused by a bacterial infection is treated with antibiotic medicine. All sexual partners must be treated.  °HOME CARE INSTRUCTIONS °· Do not have sex until the test results are known and treatment is completed, even if your symptoms go away before you finish treatment. °· If you were prescribed an antibiotic, finish it all even if you start to feel better. °SEEK MEDICAL CARE IF:  °· Your symptoms are not improved in 3 days. °· Your symptoms are getting worse. °· You develop abdominal pain or pelvic pain (in women). °· You develop joint pain. °· You have a fever. °SEEK IMMEDIATE MEDICAL CARE IF:  °· You have severe pain in the belly, back, or side. °· You have repeated vomiting. °MAKE SURE YOU: °· Understand these instructions. °· Will watch your condition. °· Will get help right away if you  are not doing well or get worse. °Document Released: 06/13/2000 Document Revised: 05/04/2013 Document Reviewed: 08/18/2012 °ExitCare® Patient Information ©2015 [employer]. This information is not intended to replace advice given to you by your health care provider. Make sure you discuss any questions you have with your health care provider. ° °

## 2014-05-10 NOTE — ED Notes (Signed)
Penile d/c for 2 days, yellow.  Urine stream has been "slow for a long time"

## 2014-05-10 NOTE — ED Notes (Signed)
Pt states abnormal discharge X 2-3 days. Denies burning when urinating.

## 2014-05-10 NOTE — ED Provider Notes (Signed)
CSN: [id number]     Arrival date & time 05/10/14  1342 History   None    Chief Complaint  Patient presents with  . Penile Discharge   Patient is a 64 y.o. male presenting with penile discharge. The history is provided by the patient. No language interpreter was used.  Penile Discharge   This chart was scribed for non-physician practitioner Evalee Jefferson, PA-C, working with Milton Ferguson, MD, by Thea Alken, ED Scribe. This patient was seen in room APFT22/APFT22 and the patient's care was started at 9:49 PM.  James Cervantes is a 64 y.o. male who presents to the Emergency Department complaining of yellow penile discharge onset 2-3 days ago. Pt states he is sexually active and only has one partner. He denies his partner having multiple partners. Pt had positive GC/chlamydia probe, 08/2013 after evaluation for a lesion to shaft of penis (syphilis was negative). He denies similar symptoms. He reports penile drainage and some nausea without emesis. Pt denies dysuria, hematuria, change in urinary frequency, fever and abdominal pain. Pt smokes a pack of cigarettes every 2-3 days.    Past Medical History  Diagnosis Date  . Asthma   . Emphysema   . Prostate disorder   . Hip pain   . Arthritis    Past Surgical History  Procedure Laterality Date  . Vascular surgery    . Lung surgery    . Abdominal surgery    . Hernia repair     Family History  Problem Relation Age of Onset  . Cancer Mother    History  Substance Use Topics  . Smoking status: Current Every Day Smoker -- 0.50 packs/day for 50 years  . Smokeless tobacco: Not on file  . Alcohol Use: No    Review of Systems  Constitutional: Negative for fever and chills.  Gastrointestinal: Positive for nausea and vomiting. Negative for abdominal distention.  Genitourinary: Positive for discharge. Negative for dysuria, frequency and hematuria.   Allergies  Review of patient's allergies indicates no known allergies.  Home Medications   Prior  to Admission medications   Medication Sig Start Date End Date Taking? Authorizing Provider  albuterol (PROAIR HFA) 108 (90 BASE) MCG/ACT inhaler Inhale 2 puffs into the lungs every 6 (six) hours as needed for wheezing or shortness of breath.   Yes Historical Provider, MD  Oxycodone HCl 10 MG TABS Take 20 mg by mouth at bedtime. Takes for hip pain   Yes Historical Provider, MD  tamsulosin (FLOMAX) 0.4 MG CAPS capsule Take 0.4 mg by mouth daily.   Yes Historical Provider, MD  doxycycline (VIBRAMYCIN) 100 MG capsule Take 1 capsule (100 mg total) by mouth 2 (two) times daily. For 10 days Patient not taking: Reported on 05/10/2014 08/18/13   Tammy Triplett, PA-C  naproxen (NAPROSYN) 500 MG tablet Take 1 tablet (500 mg total) by mouth 2 (two) times daily. Patient not taking: Reported on 05/10/2014 08/18/13   Tammy Triplett, PA-C  terbinafine (LAMISIL) 1 % cream Apply 1 application topically 2 (two) times daily. Patient not taking: Reported on 05/10/2014 01/17/14   Noland Fordyce, PA-C   BP 136/81 mmHg  Pulse 79  Temp(Src) 98.2 F (36.8 C) (Oral)  Resp 18  Ht 5\' 6"  (1.676 m)  Wt 120 lb (54.432 kg)  BMI 19.38 kg/m2  SpO2 99% Physical Exam  Constitutional: He appears well-developed and well-nourished.  HENT:  Head: Normocephalic and atraumatic.  Eyes: Conjunctivae are normal.  Neck: Normal range of motion.  Cardiovascular: Normal  rate, regular rhythm, normal heart sounds and intact distal pulses.   Pulmonary/Chest: Effort normal. Wheezes:  mild wheeze expiratory phase at bases.  Abdominal: Soft. Bowel sounds are normal. There is no tenderness.  Genitourinary: Uncircumcised.  Small amount of yellow and blood tinged discharge.  Musculoskeletal: Normal range of motion.  Neurological: He is alert.  Skin: Skin is warm and dry.  Psychiatric: He has a normal mood and affect.  Nursing note and vitals reviewed.   ED Course  Procedures (including critical care time) DIAGNOSTIC STUDIES: Oxygen Saturation  is 99% on RA, normal by my interpretation.    COORDINATION OF CARE: 9:49 PM- Pt advised of plan for treatment and pt agrees.  Labs Review Labs Reviewed  URINALYSIS, ROUTINE W REFLEX MICROSCOPIC - Abnormal; Notable for the following:    APPearance CLOUDY (*)    Hgb urine dipstick TRACE (*)    Leukocytes, UA SMALL (*)    All other components within normal limits  URINE MICROSCOPIC-ADD ON - Abnormal; Notable for the following:    Bacteria, UA FEW (*)    All other components within normal limits  GC/CHLAMYDIA PROBE AMP ([place]) - Abnormal; Notable for the following:    Neisseria gonorrhea **POSITIVE** (*)    All other components within normal limits  URINE CULTURE    Imaging Review No results found.   EKG Interpretation None      MDM   Final diagnoses:  Urethritis    Patients labs and/or radiological studies were reviewed and considered during the medical decision making and disposition process.  Results were also discussed with patient. Urine cx pending.  Pt was treated with rocephin and zithromax.  Further tx pending cx, doubt uti.  Advised pt's partner will need to be treated if he is positive for std.  Abstinence until sx resolved/partner treated.  I personally performed the services described in this documentation, which was scribed in my presence. The recorded information has been reviewed and is accurate.   Evalee Jefferson, PA-C 05/12/14 2153  Milton Ferguson, MD 05/17/14 [phone]

## 2014-05-11 LAB — URINE CULTURE
Colony Count: NO GROWTH
Culture: NO GROWTH

## 2014-05-12 LAB — GC/CHLAMYDIA PROBE AMP (~~LOC~~) NOT AT ARMC
Chlamydia: NEGATIVE
NEISSERIA GONORRHEA: POSITIVE — AB

## 2014-05-13 ENCOUNTER — Telehealth (HOSPITAL_BASED_OUTPATIENT_CLINIC_OR_DEPARTMENT_OTHER): Payer: Self-pay | Admitting: Emergency Medicine

## 2014-05-13 NOTE — Telephone Encounter (Signed)
Notified of + GC with approprioate treatment in he ED, educated re abstinence and notification of sexual partner(s)

## 2014-06-01 ENCOUNTER — Emergency Department (HOSPITAL_COMMUNITY): Payer: Medicaid Other

## 2014-06-01 ENCOUNTER — Emergency Department (HOSPITAL_COMMUNITY)
Admission: EM | Admit: 2014-06-01 | Discharge: 2014-06-01 | Disposition: A | Discharge status: Home / Self Care | Payer: Medicaid Other | Attending: Emergency Medicine | Admitting: Emergency Medicine

## 2014-06-01 ENCOUNTER — Encounter (HOSPITAL_COMMUNITY): Payer: Self-pay | Admitting: Emergency Medicine

## 2014-06-01 DIAGNOSIS — Z79899 Other long term (current) drug therapy: Secondary | ICD-10-CM | POA: Diagnosis not present

## 2014-06-01 DIAGNOSIS — M1990 Unspecified osteoarthritis, unspecified site: Secondary | ICD-10-CM | POA: Diagnosis not present

## 2014-06-01 DIAGNOSIS — Z87448 Personal history of other diseases of urinary system: Secondary | ICD-10-CM | POA: Diagnosis not present

## 2014-06-01 DIAGNOSIS — J40 Bronchitis, not specified as acute or chronic: Secondary | ICD-10-CM

## 2014-06-01 DIAGNOSIS — J45901 Unspecified asthma with (acute) exacerbation: Secondary | ICD-10-CM | POA: Insufficient documentation

## 2014-06-01 DIAGNOSIS — Z720 Tobacco use: Secondary | ICD-10-CM | POA: Insufficient documentation

## 2014-06-01 DIAGNOSIS — R05 Cough: Secondary | ICD-10-CM | POA: Diagnosis present

## 2014-06-01 MED ORDER — PREDNISONE 50 MG PO TABS
60.0000 mg | ORAL_TABLET | Freq: Once | ORAL | Status: AC
  Administered 2014-06-01: 60 mg via ORAL
  Filled 2014-06-01 (×2): qty 1

## 2014-06-01 MED ORDER — AZITHROMYCIN 250 MG PO TABS
500.0000 mg | ORAL_TABLET | Freq: Once | ORAL | Status: AC
  Administered 2014-06-01: 500 mg via ORAL
  Filled 2014-06-01: qty 2

## 2014-06-01 MED ORDER — IPRATROPIUM-ALBUTEROL 0.5-2.5 (3) MG/3ML IN SOLN
3.0000 mL | Freq: Once | RESPIRATORY_TRACT | Status: AC
  Administered 2014-06-01: 3 mL via RESPIRATORY_TRACT
  Filled 2014-06-01: qty 3

## 2014-06-01 MED ORDER — ONDANSETRON 4 MG PO TBDP
4.0000 mg | ORAL_TABLET | Freq: Once | ORAL | Status: AC
  Administered 2014-06-01: 4 mg via ORAL
  Filled 2014-06-01: qty 1

## 2014-06-01 MED ORDER — PREDNISONE 20 MG PO TABS: 40.0000 mg | ORAL_TABLET | Freq: Every day | ORAL | Status: DC

## 2014-06-01 MED ORDER — AZITHROMYCIN 250 MG PO TABS: 250.0000 mg | ORAL_TABLET | Freq: Every day | ORAL | Status: DC

## 2014-06-01 MED ORDER — GUAIFENESIN-CODEINE 100-10 MG/5ML PO SOLN
10.0000 mL | Freq: Once | ORAL | Status: AC
  Administered 2014-06-01: 10 mL via ORAL
  Filled 2014-06-01: qty 10

## 2014-06-01 MED ORDER — ALBUTEROL SULFATE HFA 108 (90 BASE) MCG/ACT IN AERS
2.0000 | INHALATION_SPRAY | Freq: Once | RESPIRATORY_TRACT | Status: AC
  Administered 2014-06-01: 2 via RESPIRATORY_TRACT
  Filled 2014-06-01: qty 6.7

## 2014-06-01 MED ORDER — IBUPROFEN 400 MG PO TABS
400.0000 mg | ORAL_TABLET | Freq: Once | ORAL | Status: AC
  Administered 2014-06-01: 400 mg via ORAL
  Filled 2014-06-01: qty 1

## 2014-06-01 NOTE — ED Notes (Signed)
Pt not feeling well x 4 day, coughing with yellow sputum, body aches, sob, tired, fever, headache

## 2014-06-01 NOTE — ED Notes (Signed)
RT called

## 2014-06-01 NOTE — ED Provider Notes (Signed)
CSN: [id number]     Arrival date & time 06/01/14  1145 History  This chart was scribed for Virgel Manifold, MD by Eustaquio Maize, ED Scribe. This patient was seen in room APA08/APA08 and the patient's care was started at 12:45 PM.    Chief Complaint  Patient presents with  . Generalized Body Aches  . Cough   The history is provided by the patient. No language interpreter was used.     HPI Comments: James Cervantes is a 64 y.o. male who presents to the Emergency Department complaining of productive cough and generalized weakness x 4-5 days. Pt reports that he has left rib pain upon coughing. He also complains of subjective fever, chills, myalgias, diaphoresis, nausea, and vomiting. He has been taking cough drops and cough medicine without relief. Pt denies new urinary changes, or any other associated symptoms. He is everyday smoker.    Past Medical History  Diagnosis Date  . Asthma   . Emphysema   . Prostate disorder   . Hip pain   . Arthritis    Past Surgical History  Procedure Laterality Date  . Vascular surgery    . Lung surgery    . Abdominal surgery    . Hernia repair     Family History  Problem Relation Age of Onset  . Cancer Mother    History  Substance Use Topics  . Smoking status: Current Every Day Smoker -- 0.50 packs/day for 50 years  . Smokeless tobacco: Not on file  . Alcohol Use: No    Review of Systems  All other systems reviewed and are negative.     Allergies  Review of patient's allergies indicates no known allergies.  Home Medications   Prior to Admission medications   Medication Sig Start Date End Date Taking? Authorizing Provider  albuterol (PROAIR HFA) 108 (90 BASE) MCG/ACT inhaler Inhale 2 puffs into the lungs every 6 (six) hours as needed for wheezing or shortness of breath.    Historical Provider, MD  doxycycline (VIBRAMYCIN) 100 MG capsule Take 1 capsule (100 mg total) by mouth 2 (two) times daily. For 10 days Patient not taking: Reported on  05/10/2014 08/18/13   Tammy Triplett, PA-C  naproxen (NAPROSYN) 500 MG tablet Take 1 tablet (500 mg total) by mouth 2 (two) times daily. Patient not taking: Reported on 05/10/2014 08/18/13   Tammy Triplett, PA-C  Oxycodone HCl 10 MG TABS Take 20 mg by mouth at bedtime. Takes for hip pain    Historical Provider, MD  tamsulosin (FLOMAX) 0.4 MG CAPS capsule Take 0.4 mg by mouth daily.    Historical Provider, MD  terbinafine (LAMISIL) 1 % cream Apply 1 application topically 2 (two) times daily. Patient not taking: Reported on 05/10/2014 01/17/14   Noland Fordyce, PA-C   Triage Vitals: BP 130/84 mmHg  Pulse 61  Temp(Src) 98.4 F (36.9 C) (Oral)  Resp 20  Ht 5\' 6"  (1.676 m)  Wt 121 lb (54.885 kg)  BMI 19.54 kg/m2  SpO2 99%   Physical Exam  Constitutional: He is oriented to person, place, and time. He appears well-developed and well-nourished. No distress.  HENT:  Head: Normocephalic and atraumatic.  Eyes: Conjunctivae and EOM are normal.  Neck: Neck supple. No tracheal deviation present.  Cardiovascular: Normal rate.   Pulmonary/Chest: Effort normal. No respiratory distress. He has wheezes.  Diffusely wheezy with decreased breath sounds on left.   Musculoskeletal: Normal range of motion.  Neurological: He is alert and oriented to person, place,  and time.  Skin: Skin is warm and dry.  Psychiatric: He has a normal mood and affect. His behavior is normal.  Nursing note and vitals reviewed.   ED Course  Procedures (including critical care time)  DIAGNOSTIC STUDIES: Oxygen Saturation is 99% on RA, normal by my interpretation.    COORDINATION OF CARE: 12:49 PM-Discussed treatment plan which includes CXR, breathing treatment, with pt at bedside and pt agreed to plan.   Labs Review Labs Reviewed - No data to display  Imaging Review No results found.   Dg Chest 2 View  06/01/2014   CLINICAL DATA:  Productive cough and shortness of breath  EXAM: CHEST  2 VIEW  COMPARISON:  April 09, 2013   FINDINGS: There is extensive postoperative change on the left with volume loss and scarring, stable. Right lung is hyperexpanded but clear. The heart size is normal. The pulmonary vascularity is somewhat distorted on the left due to the postoperative change and scarring. The appearance is stable. Pulmonary vascularity on the right is unremarkable and stable. No adenopathy. There is atherosclerotic change in the aorta. There is upper thoracic levoscoliosis.  IMPRESSION: Stable postoperative change in scarring on the left. Right lung hyperexpanded but clear. No change in cardiac silhouette   Electronically Signed   By: Lowella Grip III M.D.   On: 06/01/2014 13:10    EKG Interpretation None      MDM   Final diagnoses:  Bronchitis   I personally preformed the services scribed in my presence. The recorded information has been reviewed is accurate. Virgel Manifold, MD.     Virgel Manifold, MD 06/10/14 [phone]

## 2015-03-15 ENCOUNTER — Ambulatory Visit (INDEPENDENT_AMBULATORY_CARE_PROVIDER_SITE_OTHER): Payer: Medicaid Other | Admitting: Urology

## 2015-03-15 DIAGNOSIS — N401 Enlarged prostate with lower urinary tract symptoms: Secondary | ICD-10-CM | POA: Diagnosis not present

## 2015-03-15 DIAGNOSIS — N5201 Erectile dysfunction due to arterial insufficiency: Secondary | ICD-10-CM

## 2015-10-05 ENCOUNTER — Encounter (HOSPITAL_COMMUNITY): Payer: Self-pay | Admitting: Emergency Medicine

## 2015-10-05 ENCOUNTER — Emergency Department (HOSPITAL_COMMUNITY): Payer: Medicaid Other

## 2015-10-05 ENCOUNTER — Emergency Department (HOSPITAL_COMMUNITY)
Admission: EM | Admit: 2015-10-05 | Discharge: 2015-10-05 | Disposition: A | Discharge status: Home / Self Care | Payer: Medicaid Other | Attending: Emergency Medicine | Admitting: Emergency Medicine

## 2015-10-05 DIAGNOSIS — J40 Bronchitis, not specified as acute or chronic: Secondary | ICD-10-CM | POA: Insufficient documentation

## 2015-10-05 DIAGNOSIS — R05 Cough: Secondary | ICD-10-CM | POA: Diagnosis present

## 2015-10-05 DIAGNOSIS — Z791 Long term (current) use of non-steroidal anti-inflammatories (NSAID): Secondary | ICD-10-CM | POA: Diagnosis not present

## 2015-10-05 DIAGNOSIS — Z79899 Other long term (current) drug therapy: Secondary | ICD-10-CM | POA: Insufficient documentation

## 2015-10-05 DIAGNOSIS — F17200 Nicotine dependence, unspecified, uncomplicated: Secondary | ICD-10-CM | POA: Diagnosis not present

## 2015-10-05 DIAGNOSIS — J9801 Acute bronchospasm: Secondary | ICD-10-CM | POA: Insufficient documentation

## 2015-10-05 MED ORDER — IPRATROPIUM-ALBUTEROL 0.5-2.5 (3) MG/3ML IN SOLN
3.0000 mL | Freq: Once | RESPIRATORY_TRACT | Status: AC
  Administered 2015-10-05: 3 mL via RESPIRATORY_TRACT
  Filled 2015-10-05: qty 3

## 2015-10-05 MED ORDER — GUAIFENESIN 100 MG/5ML PO LIQD: 100.0000 mg | ORAL | 0 refills | Status: DC | PRN

## 2015-10-05 MED ORDER — GUAIFENESIN-DM 100-10 MG/5ML PO SYRP
10.0000 mL | ORAL_SOLUTION | Freq: Once | ORAL | Status: AC
  Administered 2015-10-05: 10 mL via ORAL
  Filled 2015-10-05: qty 10

## 2015-10-05 MED ORDER — AZITHROMYCIN 250 MG PO TABS: ORAL_TABLET | ORAL | 0 refills | Status: DC

## 2015-10-05 MED ORDER — PREDNISONE 10 MG PO TABS
60.0000 mg | ORAL_TABLET | Freq: Once | ORAL | Status: AC
Start: 2015-10-05 — End: 2015-10-05
  Administered 2015-10-05: 60 mg via ORAL
  Filled 2015-10-05: qty 1

## 2015-10-05 MED ORDER — AZITHROMYCIN 250 MG PO TABS
500.0000 mg | ORAL_TABLET | Freq: Once | ORAL | Status: AC
  Administered 2015-10-05: 500 mg via ORAL
  Filled 2015-10-05: qty 2

## 2015-10-05 NOTE — Discharge Instructions (Signed)
Follow up with your md for recheck °

## 2015-10-05 NOTE — ED Provider Notes (Signed)
[place] DEPT Provider Note   CSN: [id number] Arrival date & time: 10/05/15  1922     History   Chief Complaint Chief Complaint  Patient presents with  . Cough    HPI James Cervantes is a 65 y.o. male.  Patient complains of a cough and shortness of breath   The history is provided by the patient.  Cough  This is a recurrent problem. The current episode started more than 2 days ago. The problem occurs constantly. The problem has not changed since onset.The cough is non-productive. There has been no fever. Pertinent negatives include no chest pain and no headaches.    Past Medical History:  Diagnosis Date  . Arthritis   . Asthma   . Emphysema   . Hip pain   . Prostate disorder     Patient Active Problem List   Diagnosis Date Noted  . 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 Atypical chest pain 10/13/2012  . Chest pain 10/12/2012  . Abnormal EKG 10/12/2012  . GERD (gastroesophageal reflux disease) 10/12/2012  . UNSPECIFIED ANEMIA 05/18/2008  . ERECTILE DYSFUNCTION 09/25/2007  . UNS PULMONARY TB-CULT DX 06/19/2007  . UNDERWEIGHT 06/19/2007  . OSTEOARTHRITIS, HIP, RIGHT 02/18/2006  . HEPATITIS C 01/08/2006  . DISORDER, TOBACCO USE 01/08/2006  . MIGRAINE HEADACHE 01/08/2006  . ALLERGIC RHINITIS 01/08/2006  . GERD 01/08/2006  . BENIGN PROSTATIC HYPERTROPHY 01/08/2006  . OSTEOARTHRITIS 01/08/2006    Past Surgical History:  Procedure Laterality Date  . ABDOMINAL SURGERY    . HERNIA REPAIR    . LUNG SURGERY    . VASCULAR SURGERY         Home Medications    Prior to Admission medications   Medication Sig Start Date End Date Taking? Authorizing Provider  albuterol (PROAIR HFA) 108 (90 BASE) MCG/ACT inhaler Inhale 2 puffs into the lungs every 6 (six) hours as needed for wheezing or shortness of breath.    Historical Provider, MD  azithromycin (ZITHROMAX) 250 MG tablet Take one pill a day 10/05/15   Milton Ferguson, MD  doxycycline (VIBRAMYCIN) 100 MG  capsule Take 1 capsule (100 mg total) by mouth 2 (two) times daily. For 10 days Patient not taking: Reported on 05/10/2014 08/18/13   Tammy Triplett, PA-C  guaiFENesin (ROBITUSSIN) 100 MG/5ML liquid Take 5-10 mLs (100-200 mg total) by mouth every 4 (four) hours as needed for cough. 10/05/15   Milton Ferguson, MD  naproxen (NAPROSYN) 500 MG tablet Take 1 tablet (500 mg total) by mouth 2 (two) times daily. Patient not taking: Reported on 05/10/2014 08/18/13   Tammy Triplett, PA-C  Oxycodone HCl 10 MG TABS Take 20 mg by mouth at bedtime. Takes for hip pain    Historical Provider, MD  predniSONE (DELTASONE) 20 MG tablet Take 2 tablets (40 mg total) by mouth daily. 06/01/14   Virgel Manifold, MD  tamsulosin (FLOMAX) 0.4 MG CAPS capsule Take 0.4 mg by mouth daily.    Historical Provider, MD  terbinafine (LAMISIL) 1 % cream Apply 1 application topically 2 (two) times daily. Patient not taking: Reported on 05/10/2014 01/17/14   Noland Fordyce, PA-C    Family History Family History  Problem Relation Age of Onset  . Cancer Mother     Social History Social History  Substance Use Topics  . Smoking status: Current Every Day Smoker    Packs/day: 0.50    Years: 50.00  . Smokeless tobacco: Never Used  . Alcohol use No     Allergies   Review  of patient's allergies indicates no known allergies.   Review of Systems Review of Systems  Constitutional: Negative for appetite change and fatigue.  HENT: Negative for congestion, ear discharge and sinus pressure.   Eyes: Negative for discharge.  Respiratory: Positive for cough.   Cardiovascular: Negative for chest pain.  Gastrointestinal: Negative for abdominal pain and diarrhea.  Genitourinary: Negative for frequency and hematuria.  Musculoskeletal: Negative for back pain.  Skin: Negative for rash.  Neurological: Negative for seizures and headaches.  Psychiatric/Behavioral: Negative for hallucinations.     Physical Exam Updated Vital Signs BP 120/89 (BP  Location: Right Arm)   Pulse 98   Temp 98.2 F (36.8 C) (Oral)   Resp 17   Ht 5\' 6"  (1.676 m)   Wt 123 lb 4 oz (55.9 kg)   SpO2 100%   BMI 19.89 kg/m   Physical Exam  Constitutional: He is oriented to person, place, and time. He appears well-developed.  HENT:  Head: Normocephalic.  Eyes: Conjunctivae and EOM are normal. No scleral icterus.  Neck: Neck supple. No thyromegaly present.  Cardiovascular: Normal rate and regular rhythm.  Exam reveals no gallop and no friction rub.   No murmur heard. Pulmonary/Chest: No stridor. He has wheezes. He has no rales. He exhibits no tenderness.  Abdominal: He exhibits no distension. There is no tenderness. There is no rebound.  Musculoskeletal: Normal range of motion. He exhibits no edema.  Lymphadenopathy:    He has no cervical adenopathy.  Neurological: He is oriented to person, place, and time. He exhibits normal muscle tone. Coordination normal.  Skin: No rash noted. No erythema.  Psychiatric: He has a normal mood and affect. His behavior is normal.     ED Treatments / Results  Labs (all labs ordered are listed, but only abnormal results are displayed) Labs Reviewed - No data to display  EKG  EKG Interpretation None       Radiology Dg Chest 2 View  Result Date: 10/05/2015 CLINICAL DATA:  Pt c/o productive cough and wheezing x several days. Pt tried OTC meds without relief. Pt states he had lung surgery to remove part of his lung due to "bleeding." Hx emphysema, asthma, smoker x 1/2 ppd x 50 yrs. EXAM: CHEST  2 VIEW COMPARISON:  06/01/2014 FINDINGS: Cardiac silhouette is obscured by volume loss in the LEFT hemi thorax. Dense LEFT lower lobe atelectasis. Dense scarring in the LEFT upper lobe. RIGHT lung is clear. IMPRESSION: 1. No acute findings. 2. Dense chronic LEFT lower lobe opacity and LEFT upper lobe scarring. Electronically Signed   By: Suzy Bouchard M.D.   On: 10/05/2015 20:26    Procedures Procedures (including  critical care time)  Medications Ordered in ED Medications  azithromycin (ZITHROMAX) tablet 500 mg (not administered)  guaiFENesin-dextromethorphan (ROBITUSSIN DM) 100-10 MG/5ML syrup 10 mL (not administered)  ipratropium-albuterol (DUONEB) 0.5-2.5 (3) MG/3ML nebulizer solution 3 mL (not administered)  predniSONE (DELTASONE) tablet 60 mg (not administered)     Initial Impression / Assessment and Plan / ED Course  I have reviewed the triage vital signs and the nursing notes.  Pertinent labs & imaging results that were available during my care of the patient were reviewed by me and considered in my medical decision making (see chart for details).  Clinical Course    Patient with bronchitis and bronchospasm. Patient will be sent home with Z-Pak and will follow-up with his primary care doctor  Final Clinical Impressions(s) / ED Diagnoses   Final diagnoses:  Bronchitis  New Prescriptions New Prescriptions   AZITHROMYCIN (ZITHROMAX) 250 MG TABLET    Take one pill a day   GUAIFENESIN (ROBITUSSIN) 100 MG/5ML LIQUID    Take 5-10 mLs (100-200 mg total) by mouth every 4 (four) hours as needed for cough.     Milton Ferguson, MD 10/05/15 [phone]

## 2015-10-05 NOTE — ED Triage Notes (Signed)
Pt c/o cough, chills and cold sweats.

## 2016-01-31 ENCOUNTER — Ambulatory Visit (INDEPENDENT_AMBULATORY_CARE_PROVIDER_SITE_OTHER): Payer: Medicaid Other | Admitting: Urology

## 2016-01-31 DIAGNOSIS — N5201 Erectile dysfunction due to arterial insufficiency: Secondary | ICD-10-CM | POA: Diagnosis not present

## 2016-01-31 DIAGNOSIS — N401 Enlarged prostate with lower urinary tract symptoms: Secondary | ICD-10-CM | POA: Diagnosis not present

## 2016-04-20 ENCOUNTER — Emergency Department (HOSPITAL_COMMUNITY)
Admission: EM | Admit: 2016-04-20 | Discharge: 2016-04-20 | Disposition: A | Discharge status: Home / Self Care | Payer: Medicaid Other | Attending: Emergency Medicine | Admitting: Emergency Medicine

## 2016-04-20 DIAGNOSIS — R238 Other skin changes: Secondary | ICD-10-CM | POA: Diagnosis not present

## 2016-04-20 DIAGNOSIS — J45909 Unspecified asthma, uncomplicated: Secondary | ICD-10-CM | POA: Diagnosis not present

## 2016-04-20 DIAGNOSIS — Z79899 Other long term (current) drug therapy: Secondary | ICD-10-CM | POA: Diagnosis not present

## 2016-04-20 DIAGNOSIS — F17200 Nicotine dependence, unspecified, uncomplicated: Secondary | ICD-10-CM | POA: Insufficient documentation

## 2016-04-20 MED ORDER — CEPHALEXIN 500 MG PO CAPS: 1000.0000 mg | ORAL_CAPSULE | Freq: Two times a day (BID) | ORAL | 0 refills | Status: AC

## 2016-04-20 MED ORDER — CEPHALEXIN 500 MG PO CAPS
1000.0000 mg | ORAL_CAPSULE | Freq: Once | ORAL | Status: AC
  Administered 2016-04-20: 1000 mg via ORAL
  Filled 2016-04-20: qty 2

## 2016-04-20 MED ORDER — NAPROXEN 500 MG PO TABS: 500.0000 mg | ORAL_TABLET | Freq: Two times a day (BID) | ORAL | 0 refills | Status: DC

## 2016-04-20 NOTE — ED Notes (Signed)
See pa assessment 

## 2016-04-20 NOTE — Discharge Instructions (Signed)
As discussed avoid rubbing this site as this is probably making it sore and slowing down its resolution.  Take the antibiotics as prescribed.  Applying warm compresses to the site several times daily may also help resolve this skin lesion.

## 2016-04-20 NOTE — ED Triage Notes (Signed)
2 week history of knot to the back of his head  No injury

## 2016-04-22 NOTE — ED Provider Notes (Signed)
[place] DEPT Provider Note   CSN: [id number] Arrival date & time: 04/20/16  [place]     History   Chief Complaint Chief Complaint  Patient presents with  . Abscess    to back of head    HPI James Cervantes is a 66 y.o. male with past history outlined below presenting wih a 2 week history of a raised tender "knot" on his posterior scalp.  He denies injury, drainage from the site and radiation of pain.  It is nontender unless he touches it and endorses has been rubbing and squeezing at it "a lot".  He wants to make sure this is not cancer. He has had no treatment and and has found no alleviators for this condition.  The history is provided by the patient.    Past Medical History:  Diagnosis Date  . Arthritis   . Asthma   . Emphysema   . Hip pain   . Prostate disorder     Patient Active Problem List   Diagnosis Date Noted  . 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 Atypical chest pain 10/13/2012  . Chest pain 10/12/2012  . Abnormal EKG 10/12/2012  . GERD (gastroesophageal reflux disease) 10/12/2012  . UNSPECIFIED ANEMIA 05/18/2008  . ERECTILE DYSFUNCTION 09/25/2007  . UNS PULMONARY TB-CULT DX 06/19/2007  . UNDERWEIGHT 06/19/2007  . OSTEOARTHRITIS, HIP, RIGHT 02/18/2006  . HEPATITIS C 01/08/2006  . DISORDER, TOBACCO USE 01/08/2006  . MIGRAINE HEADACHE 01/08/2006  . ALLERGIC RHINITIS 01/08/2006  . GERD 01/08/2006  . BENIGN PROSTATIC HYPERTROPHY 01/08/2006  . OSTEOARTHRITIS 01/08/2006    Past Surgical History:  Procedure Laterality Date  . ABDOMINAL SURGERY    . HERNIA REPAIR    . LUNG SURGERY    . VASCULAR SURGERY         Home Medications    Prior to Admission medications   Medication Sig Start Date End Date Taking? Authorizing Provider  albuterol (PROAIR HFA) 108 (90 BASE) MCG/ACT inhaler Inhale 2 puffs into the lungs every 6 (six) hours as needed for wheezing or shortness of breath.    Historical Provider, MD  azithromycin (ZITHROMAX) 250 MG  tablet Take one pill a day 10/05/15   Milton Ferguson, MD  cephALEXin (KEFLEX) 500 MG capsule Take 2 capsules (1,000 mg total) by mouth 2 (two) times daily. 04/20/16 04/27/16  Evalee Jefferson, PA-C  doxycycline (VIBRAMYCIN) 100 MG capsule Take 1 capsule (100 mg total) by mouth 2 (two) times daily. For 10 days Patient not taking: Reported on 05/10/2014 08/18/13   Tammy Triplett, PA-C  guaiFENesin (ROBITUSSIN) 100 MG/5ML liquid Take 5-10 mLs (100-200 mg total) by mouth every 4 (four) hours as needed for cough. 10/05/15   Milton Ferguson, MD  naproxen (NAPROSYN) 500 MG tablet Take 1 tablet (500 mg total) by mouth 2 (two) times daily. 04/20/16   Evalee Jefferson, PA-C  Oxycodone HCl 10 MG TABS Take 20 mg by mouth at bedtime. Takes for hip pain    Historical Provider, MD  predniSONE (DELTASONE) 20 MG tablet Take 2 tablets (40 mg total) by mouth daily. 06/01/14   Virgel Manifold, MD  tamsulosin (FLOMAX) 0.4 MG CAPS capsule Take 0.4 mg by mouth daily.    Historical Provider, MD  terbinafine (LAMISIL) 1 % cream Apply 1 application topically 2 (two) times daily. Patient not taking: Reported on 05/10/2014 01/17/14   Noland Fordyce, PA-C    Family History Family History  Problem Relation Age of Onset  . Cancer Mother     Social  History Social History  Substance Use Topics  . Smoking status: Current Every Day Smoker    Packs/day: 0.50    Years: 50.00  . Smokeless tobacco: Never Used  . Alcohol use No     Allergies   Patient has no known allergies.   Review of Systems Review of Systems  Constitutional: Negative for appetite change, chills, fever and unexpected weight change.  Respiratory: Negative for shortness of breath and wheezing.   Gastrointestinal: Negative for nausea and vomiting.  Skin: Negative for rash and wound.  Neurological: Negative for weakness and numbness.     Physical Exam Updated Vital Signs BP (!) 157/57 (BP Location: Right Arm)   Pulse 75   Temp 98.2 F (36.8 C) (Oral)   Resp 16   Ht 5'  6" (1.676 m)   Wt 54 kg   SpO2 97%   BMI 19.21 kg/m   Physical Exam  Constitutional: He appears well-developed and well-nourished. No distress.  HENT:  Head: Normocephalic and atraumatic.  Small raised subcutaneous nodule left occipital scalp.  No erythema, no drainage or punctum/enlarged pore.  The nodule is soft and moveable.  No skin changes in coloration or texture.  Pt wearing loose braids.  Neck: Neck supple.  Cardiovascular: Normal rate.   Pulmonary/Chest: Effort normal. He has no wheezes.  Musculoskeletal: Normal range of motion. He exhibits no edema.  Skin: Skin is warm and dry. No erythema.     ED Treatments / Results  Labs (all labs ordered are listed, but only abnormal results are displayed) Labs Reviewed - No data to display  EKG  EKG Interpretation None       Radiology No results found.  Procedures Procedures (including critical care time)  Medications Ordered in ED Medications  cephALEXin (KEFLEX) capsule 1,000 mg (1,000 mg Oral Given 04/20/16 1953)     Initial Impression / Assessment and Plan / ED Course  I have reviewed the triage vital signs and the nursing notes.  Pertinent labs & imaging results that were available during my care of the patient were reviewed by me and considered in my medical decision making (see chart for details).     Possible small papule or sebaceous cyst, but no sign of infection or fluctuant pus pocket.  Offered needle aspiration for better definition which pt deferred.  Advised to avoid rubbing and to use warm water compresses.  Prescribed Keflex in event this is an infectious process. f/u with pcp for recheck if not improved with this tx. No findings to suggest skin cancer.  Final Clinical Impressions(s) / ED Diagnoses   Final diagnoses:  Inflammatory papule    New Prescriptions Discharge Medication List as of 04/20/2016  7:20 PM    START taking these medications   Details  cephALEXin (KEFLEX) 500 MG capsule  Take 2 capsules (1,000 mg total) by mouth 2 (two) times daily., Starting Fri 04/20/2016, Until Fri 04/27/2016, Print         Evalee Jefferson, PA-C 04/22/16 1134    Fredia Sorrow, MD 04/25/16 [phone]

## 2016-05-04 ENCOUNTER — Telehealth: Payer: Self-pay

## 2016-05-04 NOTE — Telephone Encounter (Signed)
First referral letter returned. Mailed to [employer] and corrected to [employer] and mailing new referral Lynne Logan letter.

## 2016-07-02 ENCOUNTER — Telehealth: Payer: Self-pay | Admitting: Internal Medicine

## 2016-07-02 NOTE — Telephone Encounter (Signed)
Letter mailed to pt.  

## 2016-07-02 NOTE — Telephone Encounter (Signed)
Recall for tcs °

## 2016-08-08 ENCOUNTER — Telehealth: Payer: Self-pay

## 2016-08-08 NOTE — Telephone Encounter (Signed)
Pt called back and asked if we could just make him a appointment, so he is coming in on 09/28/16 @10 :00 am. He is aware of appointment.

## 2016-08-08 NOTE — Telephone Encounter (Signed)
Pt's letter had been returned to physical address. I am putting in PO address. I started triaging pt and then he said some people was around and he didn't want to complete triage. I told him I will mail a reminder to his PO address with the phone number and everything and to call when he is ready to complete and schedule.

## 2016-09-28 ENCOUNTER — Ambulatory Visit: Payer: Medicaid Other | Admitting: Gastroenterology

## 2016-10-26 ENCOUNTER — Emergency Department (HOSPITAL_COMMUNITY)
Admission: EM | Admit: 2016-10-26 | Discharge: 2016-10-26 | Disposition: A | Discharge status: Home / Self Care | Payer: Medicare Other | Attending: Emergency Medicine | Admitting: Emergency Medicine

## 2016-10-26 ENCOUNTER — Encounter (HOSPITAL_COMMUNITY): Payer: Self-pay

## 2016-10-26 DIAGNOSIS — S161XXA Strain of muscle, fascia and tendon at neck level, initial encounter: Secondary | ICD-10-CM | POA: Insufficient documentation

## 2016-10-26 DIAGNOSIS — F17210 Nicotine dependence, cigarettes, uncomplicated: Secondary | ICD-10-CM | POA: Insufficient documentation

## 2016-10-26 DIAGNOSIS — J45909 Unspecified asthma, uncomplicated: Secondary | ICD-10-CM | POA: Insufficient documentation

## 2016-10-26 DIAGNOSIS — Y92410 Unspecified street and highway as the place of occurrence of the external cause: Secondary | ICD-10-CM | POA: Diagnosis not present

## 2016-10-26 DIAGNOSIS — Z79899 Other long term (current) drug therapy: Secondary | ICD-10-CM | POA: Diagnosis not present

## 2016-10-26 DIAGNOSIS — Y999 Unspecified external cause status: Secondary | ICD-10-CM | POA: Diagnosis not present

## 2016-10-26 DIAGNOSIS — S39012A Strain of muscle, fascia and tendon of lower back, initial encounter: Secondary | ICD-10-CM | POA: Insufficient documentation

## 2016-10-26 DIAGNOSIS — Y9389 Activity, other specified: Secondary | ICD-10-CM | POA: Diagnosis not present

## 2016-10-26 DIAGNOSIS — S199XXA Unspecified injury of neck, initial encounter: Secondary | ICD-10-CM | POA: Diagnosis present

## 2016-10-26 MED ORDER — CYCLOBENZAPRINE HCL 10 MG PO TABS: 10.0000 mg | ORAL_TABLET | Freq: Three times a day (TID) | ORAL | 0 refills | Status: DC

## 2016-10-26 NOTE — Discharge Instructions (Signed)
Please use Flexeril 3 times daily for spasm pain.  This medication may cause drowsiness.  Please do not drive, drink, operate machinery, or participate in activities requiring concentration when taking this medication.  Please use Tylenol, and/or ibuprofen for soreness.  Please see Dr. Lorriane Shire or return to the emergency department if any emergent changes, problems, or concerns.
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
## 2016-11-13 ENCOUNTER — Ambulatory Visit: Payer: Medicaid Other | Admitting: Gastroenterology

## 2016-11-15 NOTE — Patient Instructions (Addendum)
Your procedure is scheduled on: 11/26/2016   Report to [facility] at  8:00   AM.  Call this number if you have problems the morning of surgery: [phone]   Do not eat food or drink liquids :After Midnight.      Take these medicines the morning of surgery with A SIP OF WATER: flomax. Use your inhaler before you come.   Do not wear jewelry, make-up or nail polish.  Do not wear lotions, powders, or perfumes. You may wear deodorant.  Do not shave 48 hours prior to surgery.  Do not bring valuables to the hospital.  Contacts, dentures or bridgework may not be worn into surgery.  Leave suitcase in the car. After surgery it may be brought to your room.  For patients admitted to the hospital, checkout time is 11:00 AM the day of discharge.   Patients discharged the day of surgery will not be allowed to drive home.  :     Please read over the following fact sheets that you were given: Coughing and Deep Breathing, Surgical Site Infection Prevention, Anesthesia Post-op Instructions and Care and Recovery After Surgery    Cataract A cataract is a clouding of the lens of the eye. When a lens becomes cloudy, vision is reduced based on the degree and nature of the clouding. Many cataracts reduce vision to some degree. Some cataracts make people more near-sighted as they develop. Other cataracts increase glare. Cataracts that are ignored and become worse can sometimes look white. The white color can be seen through the pupil. CAUSES   Aging. However, cataracts may occur at any age, even in newborns.   Certain drugs.   Trauma to the eye.   Certain diseases such as diabetes.   Specific eye diseases such as chronic inflammation inside the eye or a sudden attack of a rare form of glaucoma.   Inherited or acquired medical problems.  SYMPTOMS   Gradual, progressive drop in vision in the affected eye.   Severe, rapid visual loss. This most often happens when trauma is the cause.  DIAGNOSIS  To  detect a cataract, an eye doctor examines the lens. Cataracts are best diagnosed with an exam of the eyes with the pupils enlarged (dilated) by drops.  TREATMENT  For an early cataract, vision may improve by using different eyeglasses or stronger lighting. If that does not help your vision, surgery is the only effective treatment. A cataract needs to be surgically removed when vision loss interferes with your everyday activities, such as driving, reading, or watching TV. A cataract may also have to be removed if it prevents examination or treatment of another eye problem. Surgery removes the cloudy lens and usually replaces it with a substitute lens (intraocular lens, IOL).  At a time when both you and your doctor agree, the cataract will be surgically removed. If you have cataracts in both eyes, only one is usually removed at a time. This allows the operated eye to heal and be out of danger from any possible problems after surgery (such as infection or poor wound healing). In rare cases, a cataract may be doing damage to your eye. In these cases, your caregiver may advise surgical removal right away. The vast majority of people who have cataract surgery have better vision afterward. HOME CARE INSTRUCTIONS  If you are not planning surgery, you may be asked to do the following:  Use different eyeglasses.   Use stronger or brighter lighting.   Ask your  eye doctor about reducing your medicine dose or changing medicines if it is thought that a medicine caused your cataract. Changing medicines does not make the cataract go away on its own.   Become familiar with your surroundings. Poor vision can lead to injury. Avoid bumping into things on the affected side. You are at a higher risk for tripping or falling.   Exercise extreme care when driving or operating machinery.   Wear sunglasses if you are sensitive to bright light or experiencing problems with glare.  SEEK IMMEDIATE MEDICAL CARE IF:   You have  a worsening or sudden vision loss.   You notice redness, swelling, or increasing pain in the eye.   You have a fever.  Document Released: 12/18/2004 Document Revised: 12/07/2010 Document Reviewed: 08/11/2010 [facility] Patient Information 2012 [place].PATIENT INSTRUCTIONS POST-ANESTHESIA  IMMEDIATELY FOLLOWING SURGERY:  Do not drive or operate machinery for the first twenty four hours after surgery.  Do not make any important decisions for twenty four hours after surgery or while taking narcotic pain medications or sedatives.  If you develop intractable nausea and vomiting or a severe headache please notify your doctor immediately.  FOLLOW-UP:  Please make an appointment with your surgeon as instructed. You do not need to follow up with anesthesia unless specifically instructed to do so.  WOUND CARE INSTRUCTIONS (if applicable):  Keep a dry clean dressing on the anesthesia/puncture wound site if there is drainage.  Once the wound has quit draining you may leave it open to air.  Generally you should leave the bandage intact for twenty four hours unless there is drainage.  If the epidural site drains for more than 36-48 hours please call the anesthesia department.  QUESTIONS?:  Please feel free to call your physician or the hospital operator if you have any questions, and they will be happy to assist you.

## 2016-11-20 ENCOUNTER — Encounter (HOSPITAL_COMMUNITY): Payer: Self-pay

## 2016-11-20 ENCOUNTER — Other Ambulatory Visit: Payer: Self-pay

## 2016-11-20 ENCOUNTER — Encounter (HOSPITAL_COMMUNITY)
Admission: RE | Admit: 2016-11-20 | Discharge: 2016-11-20 | Disposition: A | Discharge status: Home / Self Care | Payer: Medicare Other | Source: Ambulatory Visit | Attending: Ophthalmology | Admitting: Ophthalmology

## 2016-11-20 DIAGNOSIS — Z01818 Encounter for other preprocedural examination: Secondary | ICD-10-CM | POA: Diagnosis present

## 2016-11-20 HISTORY — DX: Gastro-esophageal reflux disease without esophagitis: K21.9

## 2016-11-20 HISTORY — DX: Anemia, unspecified: D64.9

## 2016-11-20 HISTORY — DX: Dyspnea, unspecified: R06.00

## 2016-11-20 HISTORY — DX: Peripheral vascular disease, unspecified: I73.9

## 2016-11-20 LAB — CBC
HCT: 42 % (ref 39.0–52.0)
Hemoglobin: 13.1 g/dL (ref 13.0–17.0)
MCH: 28.3 pg (ref 26.0–34.0)
MCHC: 31.2 g/dL (ref 30.0–36.0)
MCV: 90.7 fL (ref 78.0–100.0)
PLATELETS: 276 10*3/uL (ref 150–400)
RBC: 4.63 MIL/uL (ref 4.22–5.81)
RDW: 14 % (ref 11.5–15.5)
WBC: 5.7 10*3/uL (ref 4.0–10.5)

## 2016-11-20 LAB — BASIC METABOLIC PANEL
Anion gap: 7 (ref 5–15)
BUN: 11 mg/dL (ref 6–20)
CALCIUM: 9 mg/dL (ref 8.9–10.3)
CO2: 26 mmol/L (ref 22–32)
Chloride: 105 mmol/L (ref 101–111)
Creatinine, Ser: 1.27 mg/dL — ABNORMAL HIGH (ref 0.61–1.24)
GFR, EST NON AFRICAN AMERICAN: 58 mL/min — AB (ref >60)
GLUCOSE: 93 mg/dL (ref 65–99)
Potassium: 4.5 mmol/L (ref 3.5–5.1)
Sodium: 138 mmol/L (ref 135–145)

## 2016-11-26 ENCOUNTER — Ambulatory Visit (HOSPITAL_COMMUNITY): Payer: Medicare Other | Admitting: Anesthesiology

## 2016-11-26 ENCOUNTER — Ambulatory Visit (HOSPITAL_COMMUNITY)
Admission: RE | Admit: 2016-11-26 | Discharge: 2016-11-26 | Disposition: A | Discharge status: Home / Self Care | Payer: Medicare Other | Source: Ambulatory Visit | Attending: Ophthalmology | Admitting: Ophthalmology

## 2016-11-26 ENCOUNTER — Encounter (HOSPITAL_COMMUNITY)
Admission: RE | Disposition: A | Discharge status: Home / Self Care | Payer: Self-pay | Source: Ambulatory Visit | Attending: Ophthalmology

## 2016-11-26 ENCOUNTER — Encounter (HOSPITAL_COMMUNITY): Payer: Self-pay | Admitting: Ophthalmology

## 2016-11-26 DIAGNOSIS — H25812 Combined forms of age-related cataract, left eye: Secondary | ICD-10-CM | POA: Insufficient documentation

## 2016-11-26 DIAGNOSIS — F17200 Nicotine dependence, unspecified, uncomplicated: Secondary | ICD-10-CM | POA: Insufficient documentation

## 2016-11-26 DIAGNOSIS — D649 Anemia, unspecified: Secondary | ICD-10-CM | POA: Diagnosis not present

## 2016-11-26 DIAGNOSIS — R51 Headache: Secondary | ICD-10-CM | POA: Insufficient documentation

## 2016-11-26 DIAGNOSIS — R0609 Other forms of dyspnea: Secondary | ICD-10-CM | POA: Diagnosis not present

## 2016-11-26 DIAGNOSIS — M1990 Unspecified osteoarthritis, unspecified site: Secondary | ICD-10-CM | POA: Insufficient documentation

## 2016-11-26 DIAGNOSIS — K219 Gastro-esophageal reflux disease without esophagitis: Secondary | ICD-10-CM | POA: Diagnosis not present

## 2016-11-26 DIAGNOSIS — Z79899 Other long term (current) drug therapy: Secondary | ICD-10-CM | POA: Diagnosis not present

## 2016-11-26 DIAGNOSIS — H269 Unspecified cataract: Secondary | ICD-10-CM | POA: Diagnosis present

## 2016-11-26 DIAGNOSIS — J45909 Unspecified asthma, uncomplicated: Secondary | ICD-10-CM | POA: Insufficient documentation

## 2016-11-26 DIAGNOSIS — I739 Peripheral vascular disease, unspecified: Secondary | ICD-10-CM | POA: Insufficient documentation

## 2016-11-26 HISTORY — PX: CATARACT EXTRACTION W/PHACO: SHX586

## 2016-11-26 SURGERY — PHACOEMULSIFICATION, CATARACT, WITH IOL INSERTION
Anesthesia: Monitor Anesthesia Care | Site: Eye | Laterality: Left

## 2016-11-26 MED ORDER — LIDOCAINE HCL (PF) 1 % IJ SOLN
INTRAOCULAR | Status: DC | PRN
  Administered 2016-11-26: .9 mL via OPHTHALMIC

## 2016-11-26 MED ORDER — BSS IO SOLN
INTRAOCULAR | Status: DC | PRN
  Administered 2016-11-26: 15 mL via INTRAOCULAR

## 2016-11-26 MED ORDER — TRYPAN BLUE 0.06 % OP SOLN
OPHTHALMIC | Status: AC
  Filled 2016-11-26: qty 0.5

## 2016-11-26 MED ORDER — PHENYLEPHRINE HCL 2.5 % OP SOLN
1.0000 [drp] | OPHTHALMIC | Status: AC
  Administered 2016-11-26 (×3): 1 [drp] via OPHTHALMIC

## 2016-11-26 MED ORDER — FENTANYL CITRATE (PF) 100 MCG/2ML IJ SOLN
25.0000 ug | Freq: Once | INTRAMUSCULAR | Status: AC
  Administered 2016-11-26: 25 ug via INTRAVENOUS

## 2016-11-26 MED ORDER — LACTATED RINGERS IV SOLN
INTRAVENOUS | Status: DC
  Administered 2016-11-26: 1000 mL via INTRAVENOUS

## 2016-11-26 MED ORDER — PROVISC 10 MG/ML IO SOLN
INTRAOCULAR | Status: DC | PRN
  Administered 2016-11-26 (×2): 0.85 mL via INTRAOCULAR

## 2016-11-26 MED ORDER — MIDAZOLAM HCL 2 MG/2ML IJ SOLN
INTRAMUSCULAR | Status: DC | PRN
  Administered 2016-11-26: 2 mg via INTRAVENOUS

## 2016-11-26 MED ORDER — MIDAZOLAM HCL 2 MG/2ML IJ SOLN
INTRAMUSCULAR | Status: AC
  Filled 2016-11-26: qty 2

## 2016-11-26 MED ORDER — NEOMYCIN-POLYMYXIN-DEXAMETH 3.5-10000-0.1 OP SUSP
OPHTHALMIC | Status: DC | PRN
  Administered 2016-11-26: 2 [drp] via OPHTHALMIC

## 2016-11-26 MED ORDER — CYCLOPENTOLATE-PHENYLEPHRINE 0.2-1 % OP SOLN
1.0000 [drp] | OPHTHALMIC | Status: AC
  Administered 2016-11-26 (×3): 1 [drp] via OPHTHALMIC

## 2016-11-26 MED ORDER — TETRACAINE HCL 0.5 % OP SOLN
1.0000 [drp] | OPHTHALMIC | Status: AC
  Administered 2016-11-26 (×3): 1 [drp] via OPHTHALMIC

## 2016-11-26 MED ORDER — POVIDONE-IODINE 5 % OP SOLN
OPHTHALMIC | Status: DC | PRN
  Administered 2016-11-26: 1 via OPHTHALMIC

## 2016-11-26 MED ORDER — EPINEPHRINE PF 1 MG/ML IJ SOLN
INTRAOCULAR | Status: DC | PRN
  Administered 2016-11-26: 500 mL

## 2016-11-26 MED ORDER — FENTANYL CITRATE (PF) 100 MCG/2ML IJ SOLN
INTRAMUSCULAR | Status: AC
  Filled 2016-11-26: qty 2

## 2016-11-26 MED ORDER — EPINEPHRINE PF 1 MG/ML IJ SOLN
INTRAMUSCULAR | Status: AC
Start: 2016-11-26 — End: 2016-11-26
  Filled 2016-11-26: qty 1

## 2016-11-26 MED ORDER — TRYPAN BLUE 0.06 % OP SOLN
OPHTHALMIC | Status: DC | PRN
  Administered 2016-11-26: 0.5 mL via INTRAOCULAR

## 2016-11-26 MED ORDER — MIDAZOLAM HCL 2 MG/2ML IJ SOLN
1.0000 mg | INTRAMUSCULAR | Status: AC
  Administered 2016-11-26: 2 mg via INTRAVENOUS

## 2016-11-26 MED ORDER — LIDOCAINE HCL 3.5 % OP GEL
1.0000 "application " | Freq: Once | OPHTHALMIC | Status: AC
  Administered 2016-11-26: 1 via OPHTHALMIC

## 2016-11-26 SURGICAL SUPPLY — 13 items
CLOTH BEACON ORANGE TIMEOUT ST (SAFETY) ×2 IMPLANT
EYE SHIELD UNIVERSAL CLEAR (GAUZE/BANDAGES/DRESSINGS) ×2 IMPLANT
GLOVE BIOGEL PI IND STRL 7.0 (GLOVE) ×2 IMPLANT
GLOVE BIOGEL PI INDICATOR 7.0 (GLOVE) ×2
PAD ARMBOARD 7.5X6 YLW CONV (MISCELLANEOUS) ×2 IMPLANT
PROC W SPEC LENS (INTRAOCULAR LENS)
PROCESS W SPEC LENS (INTRAOCULAR LENS) IMPLANT
SIGHTPATH CAT PROC W REG LENS (Ophthalmic Related) ×2 IMPLANT
SYRINGE LUER LOK 1CC (MISCELLANEOUS) ×2 IMPLANT
TAPE SURG TRANSPORE 1 IN (GAUZE/BANDAGES/DRESSINGS) ×1 IMPLANT
TAPE SURGICAL TRANSPORE 1 IN (GAUZE/BANDAGES/DRESSINGS) ×1
VISCOELASTIC ADDITIONAL (OPHTHALMIC RELATED) ×2 IMPLANT
WATER STERILE IRR 250ML POUR (IV SOLUTION) ×2 IMPLANT

## 2016-11-26 NOTE — Discharge Instructions (Signed)
Cataract Surgery, Care After Refer to this sheet in the next few weeks. These instructions provide you with information about caring for yourself after your procedure. Your health care provider may also give you more specific instructions. Your treatment has been planned according to current medical practices, but problems sometimes occur. Call your health care provider if you have any problems or questions after your procedure. What can I expect after the procedure? After the procedure, it is common to have:  Itching.  Discomfort.  Fluid discharge.  Sensitivity to light and to touch.  Bruising.  Follow these instructions at home: [place] your eye every day for signs of infection. Watch for: ? Redness, swelling, or pain. ? Fluid, blood, or pus. ? Warmth. ? Bad smell. Activity  Avoid strenuous activities, such as playing contact sports, for as long as told by your health care provider.  Do not drive or operate heavy machinery until your health care provider approves.  Do not bend or lift heavy objects. Bending increases pressure in the eye. You can walk, climb stairs, and do light household chores.  Ask your health care provider when you can return to work. If you work in a dusty environment, you may be advised to wear protective eyewear for a period of time. General instructions  Take or apply over-the-counter and prescription medicines only as told by your health care provider. This includes eye drops.  Do not touch or rub your eyes.  If you were given a protective shield, wear it as told by your health care provider. If you were not given a protective shield, wear sunglasses as told by your health care provider to protect your eyes.  Keep the area around your eye clean and dry. Avoid swimming or allowing water to hit you directly in the face while showering until told by your health care provider. Keep soap and shampoo out of your eyes.  Do not put a contact lens  into the affected eye or eyes until your health care provider approves.  Keep all follow-up visits as told by your health care provider. This is important. Contact a health care provider if:   You have increased bruising around your eye.  You have pain that is not helped with medicine.  You have a fever.  You have redness, swelling, or pain in your eye.  You have fluid, blood, or pus coming from your incision.  Your vision gets worse. Get help right away if:  You have sudden vision loss. This information is not intended to replace advice given to you by your health care provider. Make sure you discuss any questions you have with your health care provider. Document Released: 07/07/2004 Document Revised: 04/28/2015 Document Reviewed: 10/28/2014 Elsevier Interactive Patient Education  2017 [employer].

## 2016-11-26 NOTE — Transfer of Care (Signed)
Immediate Anesthesia Transfer of Care Note  Patient: James Cervantes  Procedure(s) Performed: CATARACT EXTRACTION PHACO AND INTRAOCULAR LENS PLACEMENT LEFT EYE (Left Eye)  Patient Location: Short Stay  Anesthesia Type:MAC  Level of Consciousness: awake, alert  and patient cooperative  Airway & Oxygen Therapy: Patient Spontanous Breathing  Post-op Assessment: Report given to RN and Post -op Vital signs reviewed and stable  Post vital signs: Reviewed and stable  Last Vitals:  Vitals:   11/26/16 0935 11/26/16 0940  BP: 111/69 123/70  Pulse:    Resp: 18 15  Temp:    SpO2: 98% 98%    Last Pain:  Vitals:   11/26/16 0903  TempSrc: Oral      Patients Stated Pain Goal: 10 (95/18/84 1660)  Complications: No apparent anesthesia complications

## 2016-11-26 NOTE — H&P (Signed)
I have reviewed the H&P, the patient was re-examined, and I have identified no interval changes in medical condition and plan of care since the history and physical of record  

## 2016-11-26 NOTE — Anesthesia Postprocedure Evaluation (Signed)
Anesthesia Post Note  Patient: James Cervantes  Procedure(s) Performed: CATARACT EXTRACTION PHACO AND INTRAOCULAR LENS PLACEMENT LEFT EYE (Left Eye)  Patient location during evaluation: Short Stay Anesthesia Type: MAC Level of consciousness: awake and alert Pain management: pain level controlled Vital Signs Assessment: post-procedure vital signs reviewed and stable Respiratory status: spontaneous breathing Cardiovascular status: stable Postop Assessment: no apparent nausea or vomiting Anesthetic complications: no     Last Vitals:  Vitals:   11/26/16 0935 11/26/16 0940  BP: 111/69 123/70  Pulse:    Resp: 18 15  Temp:    SpO2: 98% 98%    Last Pain:  Vitals:   11/26/16 0903  TempSrc: Oral                 Teneshia Hedeen

## 2016-11-26 NOTE — Op Note (Signed)
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  Assistants: None  Anesthesia: Topical with MAC  Indications: Painless, progressive loss of vision with compromise of daily activities.  Surgery: Cataract Extraction with Intraocular lens Implant Left Eye  Discription: The patient had dilating drops and viscous lidocaine placed into the Left eye in the pre-op holding area. After transfer to the operating room, a time out was performed. The patient was then prepped and draped. Beginning with a 69 degree blade a paracentesis port was made at the surgeon's 2 o'clock position. The anterior chamber was then filled with 1% non-preserved lidocaine. Because the view was poor and the red reflex obscured, Vision Blue was placed into the anterior chamber. The Vision Blue was displaced from the anterior chamber with BSS. This was followed by filling the anterior chamber with Provisc. A 2.1m keratome blade was used to make a clear corneal incision at the temporal limbus. A bent cystatome needle was used to create a continuous tear capsulotomy. Hydrodissection was performed with balanced salt solution on a Fine canula. The lens nucleus was then removed using the phacoemulsification handpiece. Residual cortex was removed with the I&A handpiece. The anterior chamber and capsular bag were refilled with Provisc. A posterior chamber intraocular lens was placed into the capsular bag with it's injector. The implant was positioned with the Kuglan hook. The Provisc was then removed from the anterior chamber and capsular bag with the I&A handpiece. Stromal hydration of the main incision and paracentesis port was performed with BSS on a Fine canula. The wounds were tested for leak which was negative. The patient tolerated the procedure well. There were no operative complications. The patient  was then transferred to the recovery room in stable condition.  Complications: None  Specimen: None  EBL: None  Prosthetic device: B&L enVista, MX60, power 21.5D, SN [id number]

## 2016-11-26 NOTE — Anesthesia Procedure Notes (Signed)
Procedure Name: MAC Date/Time: 11/26/2016 9:43 AM Performed by: Vista Deck, CRNA Pre-anesthesia Checklist: Patient identified, Emergency Drugs available, Suction available, Timeout performed and Patient being monitored Patient Re-evaluated:Patient Re-evaluated prior to induction Oxygen Delivery Method: Nasal Cannula

## 2016-11-26 NOTE — Anesthesia Preprocedure Evaluation (Signed)
Anesthesia Evaluation  Patient identified by MRN, date of birth, ID band Patient awake    Reviewed: Allergy & Precautions, NPO status , Patient's Chart, lab work & pertinent test results  Airway Mallampati: I  TM Distance: >3 FB     Dental  (+) Edentulous Upper   Pulmonary shortness of breath and with exertion, asthma , COPD, Current Smoker,    breath sounds clear to auscultation       Cardiovascular + Peripheral Vascular Disease and + DOE   Rhythm:Regular Rate:Normal     Neuro/Psych  Headaches,    GI/Hepatic GERD  ,  Endo/Other    Renal/GU      Musculoskeletal   Abdominal   Peds  Hematology  (+) anemia ,   Anesthesia Other Findings   Reproductive/Obstetrics                            Anesthesia Physical Anesthesia Plan  ASA: III  Anesthesia Plan: MAC   Post-op Pain Management:    Induction: Intravenous  PONV Risk Score and Plan:   Airway Management Planned: Nasal Cannula  Additional Equipment:   Intra-op Plan:   Post-operative Plan:   Informed Consent: I have reviewed the patients History and Physical, chart, labs and discussed the procedure including the risks, benefits and alternatives for the proposed anesthesia with the patient or authorized representative who has indicated his/her understanding and acceptance.     Plan Discussed with:   Anesthesia Plan Comments:         Anesthesia Quick Evaluation

## 2016-11-27 ENCOUNTER — Encounter (HOSPITAL_COMMUNITY): Payer: Self-pay | Admitting: Ophthalmology

## 2016-12-27 ENCOUNTER — Emergency Department (HOSPITAL_COMMUNITY)
Admission: EM | Admit: 2016-12-27 | Discharge: 2016-12-27 | Disposition: A | Discharge status: Home / Self Care | Payer: Medicare Other | Attending: Emergency Medicine | Admitting: Emergency Medicine

## 2016-12-27 ENCOUNTER — Encounter (HOSPITAL_COMMUNITY): Payer: Self-pay

## 2016-12-27 ENCOUNTER — Other Ambulatory Visit: Payer: Self-pay

## 2016-12-27 ENCOUNTER — Emergency Department (HOSPITAL_COMMUNITY): Payer: Medicare Other

## 2016-12-27 DIAGNOSIS — R0602 Shortness of breath: Secondary | ICD-10-CM | POA: Diagnosis not present

## 2016-12-27 DIAGNOSIS — F17200 Nicotine dependence, unspecified, uncomplicated: Secondary | ICD-10-CM | POA: Insufficient documentation

## 2016-12-27 DIAGNOSIS — Z79899 Other long term (current) drug therapy: Secondary | ICD-10-CM | POA: Insufficient documentation

## 2016-12-27 DIAGNOSIS — J45909 Unspecified asthma, uncomplicated: Secondary | ICD-10-CM | POA: Diagnosis not present

## 2016-12-27 MED ORDER — ALBUTEROL SULFATE (2.5 MG/3ML) 0.083% IN NEBU: 5.0000 mg | INHALATION_SOLUTION | Freq: Once | RESPIRATORY_TRACT | Status: DC

## 2016-12-27 MED ORDER — DOXYCYCLINE HYCLATE 100 MG PO TABS
100.0000 mg | ORAL_TABLET | Freq: Once | ORAL | Status: AC
  Administered 2016-12-27: 100 mg via ORAL
  Filled 2016-12-27: qty 1

## 2016-12-27 MED ORDER — PREDNISONE 50 MG PO TABS
60.0000 mg | ORAL_TABLET | Freq: Once | ORAL | Status: AC
  Administered 2016-12-27: 60 mg via ORAL
  Filled 2016-12-27: qty 1

## 2016-12-27 MED ORDER — ALBUTEROL SULFATE HFA 108 (90 BASE) MCG/ACT IN AERS
2.0000 | INHALATION_SPRAY | Freq: Once | RESPIRATORY_TRACT | Status: AC
  Administered 2016-12-27: 2 via RESPIRATORY_TRACT
  Filled 2016-12-27: qty 6.7

## 2016-12-27 MED ORDER — PREDNISONE 20 MG PO TABS: ORAL_TABLET | ORAL | 0 refills | Status: DC

## 2016-12-27 MED ORDER — DOXYCYCLINE HYCLATE 100 MG PO CAPS: 100.0000 mg | ORAL_CAPSULE | Freq: Two times a day (BID) | ORAL | 0 refills | Status: DC

## 2016-12-27 MED ORDER — IPRATROPIUM-ALBUTEROL 0.5-2.5 (3) MG/3ML IN SOLN
3.0000 mL | Freq: Once | RESPIRATORY_TRACT | Status: AC
  Administered 2016-12-27: 3 mL via RESPIRATORY_TRACT
  Filled 2016-12-27: qty 3
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
## 2016-12-27 NOTE — ED Triage Notes (Signed)
Patient reports of shortness of breath x3-4 days. States he has been wihtout his inhaler. Also reports of productive cough. Denies fevers.

## 2016-12-27 NOTE — ED Notes (Signed)
Pt taken to xray 

## 2017-01-14 ENCOUNTER — Ambulatory Visit: Payer: Medicaid Other | Admitting: Gastroenterology

## 2017-01-14 ENCOUNTER — Telehealth: Payer: Self-pay | Admitting: Gastroenterology

## 2017-01-14 ENCOUNTER — Encounter: Payer: Self-pay | Admitting: Gastroenterology

## 2017-01-14 NOTE — Telephone Encounter (Signed)
PATIENT WAS A NO SHOW AND LETTER SENT  °

## 2017-01-23 ENCOUNTER — Encounter (HOSPITAL_COMMUNITY)
Admission: RE | Admit: 2017-01-23 | Discharge: 2017-01-23 | Disposition: A | Discharge status: Home / Self Care | Payer: Medicare Other | Source: Ambulatory Visit | Attending: Ophthalmology | Admitting: Ophthalmology

## 2017-01-23 ENCOUNTER — Other Ambulatory Visit (HOSPITAL_COMMUNITY): Payer: Medicare Other

## 2017-01-28 ENCOUNTER — Other Ambulatory Visit: Payer: Self-pay

## 2017-01-28 ENCOUNTER — Encounter (HOSPITAL_COMMUNITY): Payer: Self-pay | Admitting: *Deleted

## 2017-01-28 ENCOUNTER — Ambulatory Visit (HOSPITAL_COMMUNITY): Payer: Medicare Other | Admitting: Anesthesiology

## 2017-01-28 ENCOUNTER — Ambulatory Visit (HOSPITAL_COMMUNITY)
Admission: RE | Admit: 2017-01-28 | Discharge: 2017-01-28 | Disposition: A | Discharge status: Home / Self Care | Payer: Medicare Other | Source: Ambulatory Visit | Attending: Ophthalmology | Admitting: Ophthalmology

## 2017-01-28 ENCOUNTER — Encounter (HOSPITAL_COMMUNITY)
Admission: RE | Disposition: A | Discharge status: Home / Self Care | Payer: Self-pay | Source: Ambulatory Visit | Attending: Ophthalmology

## 2017-01-28 DIAGNOSIS — M1990 Unspecified osteoarthritis, unspecified site: Secondary | ICD-10-CM | POA: Insufficient documentation

## 2017-01-28 DIAGNOSIS — J45909 Unspecified asthma, uncomplicated: Secondary | ICD-10-CM | POA: Diagnosis not present

## 2017-01-28 DIAGNOSIS — H25811 Combined forms of age-related cataract, right eye: Secondary | ICD-10-CM | POA: Diagnosis present

## 2017-01-28 DIAGNOSIS — Z79899 Other long term (current) drug therapy: Secondary | ICD-10-CM | POA: Insufficient documentation

## 2017-01-28 HISTORY — PX: CATARACT EXTRACTION W/PHACO: SHX586

## 2017-01-28 SURGERY — PHACOEMULSIFICATION, CATARACT, WITH IOL INSERTION
Anesthesia: Monitor Anesthesia Care | Site: Eye | Laterality: Right

## 2017-01-28 MED ORDER — NEOMYCIN-POLYMYXIN-DEXAMETH 3.5-10000-0.1 OP SUSP
OPHTHALMIC | Status: DC | PRN
  Administered 2017-01-28: 2 [drp] via OPHTHALMIC

## 2017-01-28 MED ORDER — TETRACAINE HCL 0.5 % OP SOLN
1.0000 [drp] | OPHTHALMIC | Status: AC | PRN
Start: 2017-01-28 — End: 2017-01-28
  Administered 2017-01-28 (×3): 1 [drp] via OPHTHALMIC

## 2017-01-28 MED ORDER — LIDOCAINE HCL (PF) 1 % IJ SOLN
INTRAOCULAR | Status: DC | PRN
  Administered 2017-01-28: .8 mL via OPHTHALMIC

## 2017-01-28 MED ORDER — ONDANSETRON 4 MG PO TBDP
4.0000 mg | ORAL_TABLET | Freq: Once | ORAL | Status: AC
  Administered 2017-01-28: 4 mg via ORAL
  Filled 2017-01-28: qty 1

## 2017-01-28 MED ORDER — SODIUM CHLORIDE 0.9 % IV SOLN
INTRAVENOUS | Status: DC
  Administered 2017-01-28: 08:00:00 via INTRAVENOUS

## 2017-01-28 MED ORDER — POVIDONE-IODINE 5 % OP SOLN
OPHTHALMIC | Status: DC | PRN
  Administered 2017-01-28: 1 via OPHTHALMIC

## 2017-01-28 MED ORDER — PHENYLEPHRINE HCL 2.5 % OP SOLN
1.0000 [drp] | OPHTHALMIC | Status: AC
Start: 2017-01-28 — End: 2017-01-28
  Administered 2017-01-28 (×3): 1 [drp] via OPHTHALMIC

## 2017-01-28 MED ORDER — LACTATED RINGERS IV SOLN
INTRAVENOUS | Status: DC | PRN
  Administered 2017-01-28: 08:00:00 via INTRAVENOUS

## 2017-01-28 MED ORDER — PROVISC 10 MG/ML IO SOLN
INTRAOCULAR | Status: DC | PRN
  Administered 2017-01-28: 0.85 mL via INTRAOCULAR

## 2017-01-28 MED ORDER — CYCLOPENTOLATE-PHENYLEPHRINE 0.2-1 % OP SOLN
1.0000 [drp] | OPHTHALMIC | Status: AC
Start: 2017-01-28 — End: 2017-01-28
  Administered 2017-01-28 (×3): 1 [drp] via OPHTHALMIC

## 2017-01-28 MED ORDER — EPINEPHRINE PF 1 MG/ML IJ SOLN
INTRAOCULAR | Status: DC | PRN
  Administered 2017-01-28: 500 mL

## 2017-01-28 MED ORDER — BSS IO SOLN
INTRAOCULAR | Status: DC | PRN
  Administered 2017-01-28: 15 mL

## 2017-01-28 MED ORDER — MIDAZOLAM HCL 2 MG/2ML IJ SOLN
1.0000 mg | Freq: Once | INTRAMUSCULAR | Status: AC | PRN
  Administered 2017-01-28: 1 mg via INTRAVENOUS
  Filled 2017-01-28: qty 2

## 2017-01-28 SURGICAL SUPPLY — 12 items

## 2017-01-28 NOTE — Transfer of Care (Signed)
Immediate Anesthesia Transfer of Care Note  Patient: James Cervantes  Procedure(s) Performed: CATARACT EXTRACTION PHACO AND INTRAOCULAR LENS PLACEMENT RIGHT EYE (Right Eye)  Patient Location: Short Stay  Anesthesia Type:MAC  Level of Consciousness: awake, alert , oriented and patient cooperative  Airway & Oxygen Therapy: Patient Spontanous Breathing  Post-op Assessment: Report given to RN and Post -op Vital signs reviewed and stable  Post vital signs: Reviewed and stable  Last Vitals:  Vitals:   01/28/17 0810 01/28/17 0815  BP: 124/81 132/80  Pulse:    Resp: 20 17  Temp:    SpO2: 97% 99%    Last Pain:  Vitals:   01/28/17 0727  TempSrc: Oral         Complications: No apparent anesthesia complications

## 2017-01-28 NOTE — Discharge Instructions (Signed)

## 2017-01-28 NOTE — Anesthesia Preprocedure Evaluation (Signed)
Anesthesia Evaluation  Patient identified by MRN, date of birth, ID band Patient awake    Airway Mallampati: I       Dental  (+) Poor Dentition   Pulmonary asthma , COPD,  COPD inhaler, Current Smoker,    Pulmonary exam normal        Cardiovascular + Peripheral Vascular Disease  Normal cardiovascular exam Rhythm:Regular Rate:Normal  The estimated ejection fraction was in the range of 40% to 45%. Diffuse hypokinesis.   (2014)  Sinus rhythm Atrial premature complex LVH with secondary repolarization abnormality Anterior Q waves, possibly due to LVH    (2018)   Neuro/Psych    GI/Hepatic GERD  Medicated and Controlled,  Endo/Other    Renal/GU Renal diseaseResults for James Cervantes, James Cervantes (MRN [id number]) as of 01/28/2017 07:45  11/20/2016 13:10 BUN: 11 Creatinine: 1.27 (H)      Musculoskeletal   Abdominal   Peds  Hematology   Anesthesia Other Findings   Reproductive/Obstetrics                            Anesthesia Physical Anesthesia Plan  ASA: III  Anesthesia Plan: MAC   Post-op Pain Management:    Induction:   PONV Risk Score and Plan:   Airway Management Planned: Nasal Cannula  Additional Equipment:   Intra-op Plan:   Post-operative Plan:   Informed Consent: I have reviewed the patients History and Physical, chart, labs and discussed the procedure including the risks, benefits and alternatives for the proposed anesthesia with the patient or authorized representative who has indicated his/her understanding and acceptance.   Dental advisory given  Plan Discussed with: CRNA  Anesthesia Plan Comments:         Anesthesia Quick Evaluation

## 2017-01-28 NOTE — Op Note (Signed)
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  Assistants: None  Anesthesia: Topical with MAC  Indications: Painless, progressive loss of vision with compromise of daily activities.  Surgery: Cataract Extraction with Intraocular lens Implant Right Eye  Discription: The patient had dilating drops and viscous lidocaine placed into the Right eye in the pre-op holding area. After transfer to the operating room, a time out was performed. The patient was then prepped and draped. Beginning with a 56m blade a paracentesis port was made at the surgeon's 2 o'clock position. The anterior chamber was then filled with 1% non-preserved lidocaine with epinepherine. This was followed by filling the anterior chamber with Provisc.  A 2.429mkeratome blade was used to make a clear corneal incision at the temporal limbus.  A bent cystatome needle was used to create a continuous tear capsulotomy. Hydrodissection was performed with balanced salt solution on a Fine canula. The lens nucleus was then removed using the phacoemulsification handpiece. Residual cortex was removed with the I&A handpiece. The anterior chamber and capsular bag were refilled with Provisc. A posterior chamber intraocular lens was placed into the capsular bag with it's injector. The implant was positioned with the Kuglan hook. The Provisc was then removed from the anterior chamber and capsular bag with the I&A handpiece. Stromal hydration of the main incision and paracentesis port was performed with BSS on a Fine canula. The wounds were tested for leak which was negative. The patient tolerated the procedure well. There were no operative complications. The patient was then transferred to the recovery room in stable condition.  Complications: None  Specimen: None  EBL: None  Prosthetic device: J&J Technis,  PCB00, power 20.0, SN [id number]

## 2017-01-28 NOTE — Anesthesia Postprocedure Evaluation (Signed)
Anesthesia Post Note  Patient: James Cervantes  Procedure(s) Performed: CATARACT EXTRACTION PHACO AND INTRAOCULAR LENS PLACEMENT RIGHT EYE (Right Eye)  Patient location during evaluation: Short Stay Anesthesia Type: MAC Level of consciousness: awake and alert, oriented and patient cooperative Pain management: pain level controlled Vital Signs Assessment: post-procedure vital signs reviewed and stable Postop Assessment: no apparent nausea or vomiting Anesthetic complications: no     Last Vitals:  Vitals:   01/28/17 0810 01/28/17 0815  BP: 124/81 132/80  Pulse:    Resp: 20 17  Temp:    SpO2: 97% 99%    Last Pain:  Vitals:   01/28/17 0727  TempSrc: Oral                 ADAMS, AMY A

## 2017-01-28 NOTE — Anesthesia Procedure Notes (Signed)
Procedure Name: MAC Date/Time: 01/28/2017 8:20 AM Performed by: Andree Elk Amy A, CRNA Pre-anesthesia Checklist: Patient identified, Timeout performed, Emergency Drugs available, Suction available and Patient being monitored Oxygen Delivery Method: Nasal cannula

## 2017-01-28 NOTE — H&P (Signed)
I have reviewed the H&P, the patient was re-examined, and I have identified no interval changes in medical condition and plan of care since the history and physical of record  

## 2017-01-29 ENCOUNTER — Encounter (HOSPITAL_COMMUNITY): Payer: Self-pay | Admitting: Ophthalmology

## 2017-02-12 ENCOUNTER — Emergency Department (HOSPITAL_COMMUNITY)
Admission: EM | Admit: 2017-02-12 | Discharge: 2017-02-12 | Discharge status: Against Medical Advice | Payer: Medicare Other | Attending: Emergency Medicine | Admitting: Emergency Medicine

## 2017-02-12 ENCOUNTER — Other Ambulatory Visit: Payer: Self-pay

## 2017-02-12 ENCOUNTER — Encounter (HOSPITAL_COMMUNITY): Payer: Self-pay | Admitting: Emergency Medicine

## 2017-02-12 ENCOUNTER — Emergency Department (HOSPITAL_COMMUNITY): Payer: Medicare Other

## 2017-02-12 DIAGNOSIS — I739 Peripheral vascular disease, unspecified: Secondary | ICD-10-CM | POA: Diagnosis not present

## 2017-02-12 DIAGNOSIS — J439 Emphysema, unspecified: Secondary | ICD-10-CM | POA: Diagnosis not present

## 2017-02-12 DIAGNOSIS — J45909 Unspecified asthma, uncomplicated: Secondary | ICD-10-CM | POA: Diagnosis not present

## 2017-02-12 DIAGNOSIS — J441 Chronic obstructive pulmonary disease with (acute) exacerbation: Secondary | ICD-10-CM | POA: Diagnosis not present

## 2017-02-12 DIAGNOSIS — Z79891 Long term (current) use of opiate analgesic: Secondary | ICD-10-CM | POA: Insufficient documentation

## 2017-02-12 DIAGNOSIS — R0602 Shortness of breath: Secondary | ICD-10-CM | POA: Diagnosis present

## 2017-02-12 DIAGNOSIS — F17210 Nicotine dependence, cigarettes, uncomplicated: Secondary | ICD-10-CM | POA: Insufficient documentation

## 2017-02-12 LAB — BASIC METABOLIC PANEL
ANION GAP: 9 (ref 5–15)
BUN: 14 mg/dL (ref 6–20)
CHLORIDE: 103 mmol/L (ref 101–111)
CO2: 27 mmol/L (ref 22–32)
Calcium: 8.9 mg/dL (ref 8.9–10.3)
Creatinine, Ser: 0.94 mg/dL (ref 0.61–1.24)
GFR calc non Af Amer: >60 mL/min (ref >60)
Glucose, Bld: 87 mg/dL (ref 65–99)
Potassium: 4.2 mmol/L (ref 3.5–5.1)
Sodium: 139 mmol/L (ref 135–145)

## 2017-02-12 LAB — CBC WITH DIFFERENTIAL/PLATELET
Basophils Absolute: 0 10*3/uL (ref 0.0–0.1)
Basophils Relative: 0 %
Eosinophils Absolute: 0.1 10*3/uL (ref 0.0–0.7)
Eosinophils Relative: 1 %
HEMATOCRIT: 38.5 % — AB (ref 39.0–52.0)
Hemoglobin: 12.2 g/dL — ABNORMAL LOW (ref 13.0–17.0)
LYMPHS ABS: 1.8 10*3/uL (ref 0.7–4.0)
Lymphocytes Relative: 26 %
MCH: 28.2 pg (ref 26.0–34.0)
MCHC: 31.7 g/dL (ref 30.0–36.0)
MCV: 88.9 fL (ref 78.0–100.0)
MONOS PCT: 7 %
Monocytes Absolute: 0.5 10*3/uL (ref 0.1–1.0)
NEUTROS ABS: 4.5 10*3/uL (ref 1.7–7.7)
Neutrophils Relative %: 66 %
Platelets: 312 10*3/uL (ref 150–400)
RBC: 4.33 MIL/uL (ref 4.22–5.81)
RDW: 13.5 % (ref 11.5–15.5)
WBC: 7 10*3/uL (ref 4.0–10.5)

## 2017-02-12 LAB — TROPONIN I: Troponin I: <0.03 ng/mL (ref <0.03)

## 2017-02-12 MED ORDER — METHYLPREDNISOLONE SODIUM SUCC 125 MG IJ SOLR
125.0000 mg | Freq: Once | INTRAMUSCULAR | Status: AC
  Administered 2017-02-12: 125 mg via INTRAVENOUS
  Filled 2017-02-12: qty 2

## 2017-02-12 MED ORDER — IPRATROPIUM-ALBUTEROL 0.5-2.5 (3) MG/3ML IN SOLN
RESPIRATORY_TRACT | Status: AC
  Administered 2017-02-12: 3 mL via RESPIRATORY_TRACT
  Filled 2017-02-12: qty 3

## 2017-02-12 MED ORDER — ALBUTEROL SULFATE HFA 108 (90 BASE) MCG/ACT IN AERS
2.0000 | INHALATION_SPRAY | Freq: Once | RESPIRATORY_TRACT | Status: AC
  Administered 2017-02-12: 2 via RESPIRATORY_TRACT
  Filled 2017-02-12: qty 6.7

## 2017-02-12 MED ORDER — ALBUTEROL SULFATE (2.5 MG/3ML) 0.083% IN NEBU
2.5000 mg | INHALATION_SOLUTION | Freq: Once | RESPIRATORY_TRACT | Status: DC
  Filled 2017-02-12: qty 3

## 2017-02-12 MED ORDER — ALBUTEROL SULFATE (2.5 MG/3ML) 0.083% IN NEBU
5.0000 mg | INHALATION_SOLUTION | Freq: Once | RESPIRATORY_TRACT | Status: AC
  Administered 2017-02-12: 5 mg via RESPIRATORY_TRACT
  Filled 2017-02-12: qty 6

## 2017-02-12 MED ORDER — IPRATROPIUM-ALBUTEROL 0.5-2.5 (3) MG/3ML IN SOLN
3.0000 mL | Freq: Once | RESPIRATORY_TRACT | Status: AC
  Administered 2017-02-12: 3 mL via RESPIRATORY_TRACT

## 2017-02-12 NOTE — ED Notes (Signed)
Pt states he feels better.  Continue to auscultate bilateral expiratory wheezes.

## 2017-02-12 NOTE — ED Notes (Signed)
Pt states he has got to go.  States just give me my inhaler and I am leaving.  Pt signed AMA form and PA notified.

## 2017-02-12 NOTE — ED Triage Notes (Signed)
Patient c/o SOB that started today. Patient states his inhaler ran out late last night. Patient speaking in complete sentences.

## 2017-02-12 NOTE — ED Notes (Signed)
Pt left ambulatory without difficulty.

## 2017-02-12 NOTE — ED Notes (Signed)
Pt states his breathing is better.

## 2017-02-13 NOTE — ED Provider Notes (Signed)
[facility] EMERGENCY DEPARTMENT Provider Note   CSN: [id number] Arrival date & time: 02/12/17  1049     History   Chief Complaint Chief Complaint  Patient presents with  . Shortness of Breath    HPI James Cervantes is a 67 y.o. male.  HPI  James Cervantes is a 67 y.o. male with hx of poorly controlled asthma, COPD presents to the Emergency Department complaining of shortness of breath and cough.  States that he ran out of his albuterol inhaler on the evening prior to arrival.  He comes to the ER requesting a refill after being told by the pharmacist that he could not get a refill yet because he recently had one filled.  He admits to using 2 + inhalers a month recently.  He states that he only uses albuterol and has not been on any maintenance inhalers before.  Cough has been non-productive.  States that his shortness of breath is constant, but worsens with exertion.  He denies chest pain, fever, chills, hemoptysis, and edema of the extremities.  Pt continues to smoke cigarettes   Past Medical History:  Diagnosis Date  . Anemia   . Arthritis   . Asthma   . Dyspnea   . Emphysema   . GERD (gastroesophageal reflux disease)   . Hip pain   . Peripheral vascular disease ([place])   . Prostate disorder     Patient Active Problem List   Diagnosis Date Noted  . 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 Atypical chest pain 10/13/2012  . Chest pain 10/12/2012  . Abnormal EKG 10/12/2012  . GERD (gastroesophageal reflux disease) 10/12/2012  . UNSPECIFIED ANEMIA 05/18/2008  . ERECTILE DYSFUNCTION 09/25/2007  . UNS PULMONARY TB-CULT DX 06/19/2007  . UNDERWEIGHT 06/19/2007  . OSTEOARTHRITIS, HIP, RIGHT 02/18/2006  . HEPATITIS C 01/08/2006  . DISORDER, TOBACCO USE 01/08/2006  . MIGRAINE HEADACHE 01/08/2006  . ALLERGIC RHINITIS 01/08/2006  . GERD 01/08/2006  . BENIGN PROSTATIC HYPERTROPHY 01/08/2006  . OSTEOARTHRITIS 01/08/2006    Past Surgical History:  Procedure Laterality Date    . ABDOMINAL SURGERY    . CATARACT EXTRACTION W/PHACO Left 11/26/2016   Procedure: CATARACT EXTRACTION PHACO AND INTRAOCULAR LENS PLACEMENT LEFT EYE;  Surgeon: Tonny Branch, MD;  Location: [facility];  Service: Ophthalmology;  Laterality: Left;  CDE: 26.53   . CATARACT EXTRACTION W/PHACO Right 01/28/2017   Procedure: CATARACT EXTRACTION PHACO AND INTRAOCULAR LENS PLACEMENT RIGHT EYE;  Surgeon: Tonny Branch, MD;  Location: [facility];  Service: Ophthalmology;  Laterality: Right;  CDE: 6.79  . HERNIA REPAIR    . LUNG SURGERY    . VASCULAR SURGERY         Home Medications    Prior to Admission medications   Medication Sig Start Date End Date Taking? Authorizing Provider  albuterol (PROAIR HFA) 108 (90 BASE) MCG/ACT inhaler Inhale 2 puffs into the lungs every 6 (six) hours as needed for wheezing or shortness of breath.   Yes [provider]  Oxycodone HCl 10 MG TABS Take 20 mg at bedtime by mouth.    Yes [provider]  tamsulosin (FLOMAX) 0.4 MG CAPS capsule Take 0.4 mg by mouth at bedtime.    Yes [provider]    Family History Family History  Problem Relation Age of Onset  . Cancer Mother     Social History Social History   Tobacco Use  . Smoking status: Current Every Day Smoker    Packs/day: 0.50  Years: 50.00    Pack years: 25.00  . Smokeless tobacco: Never Used  Substance Use Topics  . Alcohol use: No  . Drug use: No     Allergies   Patient has no known allergies.   Review of Systems Review of Systems  Constitutional: Negative for appetite change, chills and fever.  HENT: Negative for congestion, sore throat and trouble swallowing.   Respiratory: Positive for cough and shortness of breath. Negative for chest tightness and wheezing.   Cardiovascular: Negative for chest pain and leg swelling.  Gastrointestinal: Negative for abdominal pain, nausea and vomiting.  Musculoskeletal: Negative for arthralgias.  Skin: Negative for rash.   Neurological: Negative for dizziness, weakness and numbness.  Hematological: Negative for adenopathy.  All other systems reviewed and are negative.    Physical Exam Updated Vital Signs BP (!) 142/89   Pulse 82   Temp 98.5 F (36.9 C) (Oral)   Resp 18   Ht 5\' 6"  (1.676 m)   Wt 56.7 kg (125 lb)   SpO2 100%   BMI 20.18 kg/m   Physical Exam  Constitutional: He is oriented to person, place, and time. He appears well-developed and well-nourished. No distress.  HENT:  Head: Normocephalic and atraumatic.  Right Ear: Tympanic membrane and ear canal normal.  Left Ear: Tympanic membrane and ear canal normal.  Mouth/Throat: Uvula is midline, oropharynx is clear and moist and mucous membranes are normal. No oropharyngeal exudate.  Eyes: EOM are normal. Pupils are equal, round, and reactive to light.  Neck: Normal range of motion, full passive range of motion without pain and phonation normal. Neck supple.  Cardiovascular: Normal rate, regular rhythm and intact distal pulses.  No murmur heard. Pulmonary/Chest: Effort normal. No stridor. No respiratory distress. He has wheezes. He has no rales. He exhibits no tenderness.  Diminished lung sounds bilaterally with expiratory wheezes throughout.  pt able to speak in full sentences w/o distress  Abdominal: Soft. He exhibits no distension. There is no tenderness.  Musculoskeletal: Normal range of motion. He exhibits no edema.  Lymphadenopathy:    He has no cervical adenopathy.  Neurological: He is alert and oriented to person, place, and time. He exhibits normal muscle tone. Coordination normal.  Skin: Skin is warm and dry.  Nursing note and vitals reviewed.    ED Treatments / Results  Labs (all labs ordered are listed, but only abnormal results are displayed) Labs Reviewed  CBC WITH DIFFERENTIAL/PLATELET - Abnormal; Notable for the following components:      Result Value   Hemoglobin 12.2 (*)    HCT 38.5 (*)    All other components  within normal limits  BASIC METABOLIC PANEL  TROPONIN I    EKG  EKG Interpretation  Date/Time:  Tuesday February 12 2017 10:57:02 EST Ventricular Rate:  87 PR Interval:  142 QRS Duration: 88 QT Interval:  406 QTC Calculation: 488 R Axis:   86 Text Interpretation:  Normal sinus rhythm with sinus arrhythmia Left ventricular hypertrophy with repolarization abnormality Prolonged QT Abnormal ECG Confirmed by Nat Christen [phone]) on 02/12/2017 1:04:57 PM       Radiology Dg Chest 2 View  Result Date: 02/12/2017 CLINICAL DATA:  Shortness of breath for 2 days EXAM: CHEST  2 VIEW COMPARISON:  12/27/2016 FINDINGS: Partial pneumonectomy on the left is again seen and stable. Deformity of the left chest wall is noted related to the prior surgery. Stable scarring in the residual left lung is noted. The right lung is well aerated  with stable increased interstitial markings. No focal infiltrate or sizable effusion is seen. No acute bony abnormality is noted. Chronic scoliosis is again seen. IMPRESSION: Chronic changes without acute abnormality. Electronically Signed   By: Inez Catalina M.D.   On: 02/12/2017 11:23    Procedures Procedures (including critical care time)  Medications Ordered in ED Medications  albuterol (PROVENTIL) (2.5 MG/3ML) 0.083% nebulizer solution 5 mg (5 mg Nebulization Given 02/12/17 1117)  ipratropium-albuterol (DUONEB) 0.5-2.5 (3) MG/3ML nebulizer solution 3 mL (3 mLs Nebulization Given 02/12/17 1118)  methylPREDNISolone sodium succinate (SOLU-MEDROL) 125 mg/2 mL injection 125 mg (125 mg Intravenous Given 02/12/17 1235)  albuterol (PROVENTIL HFA;VENTOLIN HFA) 108 (90 Base) MCG/ACT inhaler 2 puff (2 puffs Inhalation Given 02/12/17 1415)     Initial Impression / Assessment and Plan / ED Course  I have reviewed the triage vital signs and the nursing notes.  Pertinent labs & imaging results that were available during my care of the patient were reviewed by me and considered in my  medical decision making (see chart for details).     Vitals reviewed, no respiratory distress noted.   Lungs sounds improved slightly after albuterol neb, but continues to have expiratory wheezing.  Labs, EKG and CXR are reassuring.  Pt requesting inhaler.  Will dispense one  1430  I was informed by nursing staff that pt requesting to leave.  Signed out AMA.    Final Clinical Impressions(s) / ED Diagnoses   Final diagnoses:  COPD exacerbation [facility])    ED Discharge Orders    None       Kem Parkinson, PA-C 02/13/17 1419    Nat Christen, MD 02/14/17 [phone]

## 2017-03-26 ENCOUNTER — Ambulatory Visit: Payer: Medicare Other | Admitting: Urology

## 2017-04-04 ENCOUNTER — Encounter (HOSPITAL_COMMUNITY): Payer: Self-pay | Admitting: *Deleted

## 2017-04-04 ENCOUNTER — Emergency Department (HOSPITAL_COMMUNITY): Payer: Medicare Other

## 2017-04-04 ENCOUNTER — Other Ambulatory Visit: Payer: Self-pay

## 2017-04-04 ENCOUNTER — Inpatient Hospital Stay (HOSPITAL_COMMUNITY)
Admission: EM | Admit: 2017-04-04 | Discharge: 2017-04-06 | DRG: 293 | Disposition: A | Discharge status: Home / Self Care | Payer: Medicare Other | Attending: Family Medicine | Admitting: Family Medicine

## 2017-04-04 DIAGNOSIS — Z9841 Cataract extraction status, right eye: Secondary | ICD-10-CM | POA: Diagnosis not present

## 2017-04-04 DIAGNOSIS — N400 Enlarged prostate without lower urinary tract symptoms: Secondary | ICD-10-CM | POA: Diagnosis present

## 2017-04-04 DIAGNOSIS — Z8249 Family history of ischemic heart disease and other diseases of the circulatory system: Secondary | ICD-10-CM | POA: Diagnosis not present

## 2017-04-04 DIAGNOSIS — I739 Peripheral vascular disease, unspecified: Secondary | ICD-10-CM | POA: Diagnosis present

## 2017-04-04 DIAGNOSIS — K219 Gastro-esophageal reflux disease without esophagitis: Secondary | ICD-10-CM | POA: Diagnosis not present

## 2017-04-04 DIAGNOSIS — I5043 Acute on chronic combined systolic (congestive) and diastolic (congestive) heart failure: Secondary | ICD-10-CM | POA: Diagnosis present

## 2017-04-04 DIAGNOSIS — F17210 Nicotine dependence, cigarettes, uncomplicated: Secondary | ICD-10-CM | POA: Diagnosis not present

## 2017-04-04 DIAGNOSIS — Z9842 Cataract extraction status, left eye: Secondary | ICD-10-CM | POA: Diagnosis not present

## 2017-04-04 DIAGNOSIS — Z902 Acquired absence of lung [part of]: Secondary | ICD-10-CM | POA: Diagnosis not present

## 2017-04-04 DIAGNOSIS — J439 Emphysema, unspecified: Secondary | ICD-10-CM | POA: Diagnosis present

## 2017-04-04 DIAGNOSIS — I110 Hypertensive heart disease with heart failure: Secondary | ICD-10-CM | POA: Diagnosis not present

## 2017-04-04 DIAGNOSIS — I428 Other cardiomyopathies: Secondary | ICD-10-CM | POA: Diagnosis present

## 2017-04-04 DIAGNOSIS — Z79891 Long term (current) use of opiate analgesic: Secondary | ICD-10-CM

## 2017-04-04 DIAGNOSIS — Z961 Presence of intraocular lens: Secondary | ICD-10-CM | POA: Diagnosis not present

## 2017-04-04 DIAGNOSIS — Z9119 Patient's noncompliance with other medical treatment and regimen: Secondary | ICD-10-CM | POA: Diagnosis not present

## 2017-04-04 DIAGNOSIS — I509 Heart failure, unspecified: Secondary | ICD-10-CM | POA: Diagnosis present

## 2017-04-04 HISTORY — DX: Cardiomyopathy, unspecified: I42.9

## 2017-04-04 LAB — CBC WITH DIFFERENTIAL/PLATELET
BASOS PCT: 0 %
Basophils Absolute: 0 10*3/uL (ref 0.0–0.1)
EOS ABS: 0 10*3/uL (ref 0.0–0.7)
EOS PCT: 0 %
HCT: 35.8 % — ABNORMAL LOW (ref 39.0–52.0)
HEMOGLOBIN: 11.2 g/dL — AB (ref 13.0–17.0)
Lymphocytes Relative: 10 %
Lymphs Abs: 0.7 10*3/uL (ref 0.7–4.0)
MCH: 28.1 pg (ref 26.0–34.0)
MCHC: 31.3 g/dL (ref 30.0–36.0)
MCV: 89.7 fL (ref 78.0–100.0)
Monocytes Absolute: 0.3 10*3/uL (ref 0.1–1.0)
Monocytes Relative: 4 %
NEUTROS PCT: 86 %
Neutro Abs: 6.4 10*3/uL (ref 1.7–7.7)
PLATELETS: 301 10*3/uL (ref 150–400)
RBC: 3.99 MIL/uL — AB (ref 4.22–5.81)
RDW: 14.5 % (ref 11.5–15.5)
WBC: 7.4 10*3/uL (ref 4.0–10.5)

## 2017-04-04 LAB — BASIC METABOLIC PANEL
Anion gap: 10 (ref 5–15)
BUN: 20 mg/dL (ref 6–20)
CALCIUM: 8.9 mg/dL (ref 8.9–10.3)
CO2: 25 mmol/L (ref 22–32)
CREATININE: 1.05 mg/dL (ref 0.61–1.24)
Chloride: 100 mmol/L — ABNORMAL LOW (ref 101–111)
Glucose, Bld: 106 mg/dL — ABNORMAL HIGH (ref 65–99)
Potassium: 4.4 mmol/L (ref 3.5–5.1)
SODIUM: 135 mmol/L (ref 135–145)

## 2017-04-04 LAB — TROPONIN I

## 2017-04-04 LAB — BRAIN NATRIURETIC PEPTIDE: B NATRIURETIC PEPTIDE 5: 3142 pg/mL — AB (ref 0.0–100.0)

## 2017-04-04 MED ORDER — IOPAMIDOL (ISOVUE-300) INJECTION 61%
75.0000 mL | Freq: Once | INTRAVENOUS | Status: AC | PRN
  Administered 2017-04-04: 75 mL via INTRAVENOUS

## 2017-04-04 MED ORDER — FUROSEMIDE 10 MG/ML IJ SOLN
60.0000 mg | Freq: Once | INTRAMUSCULAR | Status: AC
  Administered 2017-04-04: 60 mg via INTRAVENOUS
  Filled 2017-04-04: qty 6

## 2017-04-04 MED ORDER — IPRATROPIUM-ALBUTEROL 0.5-2.5 (3) MG/3ML IN SOLN
3.0000 mL | Freq: Once | RESPIRATORY_TRACT | Status: AC
  Administered 2017-04-04: 3 mL via RESPIRATORY_TRACT
  Filled 2017-04-04: qty 3

## 2017-04-04 NOTE — ED Notes (Signed)
Pt states he is ready to go home, edp notified.

## 2017-04-04 NOTE — ED Triage Notes (Signed)
Pt c/o bilateral legs and feet are swollen and aching x couple of days. Pt c/o difficulty breathing since increase in leg swelling.

## 2017-04-04 NOTE — ED Provider Notes (Signed)
Emergency Department Provider Note   I have reviewed the triage vital signs and the nursing notes.   HISTORY  Chief Complaint Leg Swelling and Shortness of Breath   HPI James Cervantes is a 67 y.o. male with PMH of emphysema, GERD, PVD, and asthma since to the emergency department for evaluation of sudden onset bilateral lower extremity swelling and pain with dyspnea.  The patient states that as the leg swelling is worse and so has his breathing difficulty.  He has tried his inhalers for emphysema with no significant relief in symptoms.  Patient describes needing to sleep propped up and waking up feeling short of breath in the middle of the night.  Symptoms are worse with lying flat.  Denies any fevers, chills, hemoptysis, or other productive cough.  Denies any chest pain. No radiation of symptoms.    Past Medical History:  Diagnosis Date  . Anemia   . Arthritis   . Asthma   . Dyspnea   . Emphysema   . GERD (gastroesophageal reflux disease)   . Hip pain   . Peripheral vascular disease ([place])   . Prostate disorder     Patient Active Problem List   Diagnosis Date Noted  . 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 Atypical chest pain 10/13/2012  . Chest pain 10/12/2012  . Abnormal EKG 10/12/2012  . GERD (gastroesophageal reflux disease) 10/12/2012  . UNSPECIFIED ANEMIA 05/18/2008  . ERECTILE DYSFUNCTION 09/25/2007  . UNS PULMONARY TB-CULT DX 06/19/2007  . UNDERWEIGHT 06/19/2007  . OSTEOARTHRITIS, HIP, RIGHT 02/18/2006  . HEPATITIS C 01/08/2006  . DISORDER, TOBACCO USE 01/08/2006  . MIGRAINE HEADACHE 01/08/2006  . ALLERGIC RHINITIS 01/08/2006  . GERD 01/08/2006  . BENIGN PROSTATIC HYPERTROPHY 01/08/2006  . OSTEOARTHRITIS 01/08/2006    Past Surgical History:  Procedure Laterality Date  . ABDOMINAL SURGERY    . CATARACT EXTRACTION W/PHACO Left 11/26/2016   Procedure: CATARACT EXTRACTION PHACO AND INTRAOCULAR LENS PLACEMENT LEFT EYE;  Surgeon: Tonny Branch, MD;   Location: [facility];  Service: Ophthalmology;  Laterality: Left;  CDE: 26.53   . CATARACT EXTRACTION W/PHACO Right 01/28/2017   Procedure: CATARACT EXTRACTION PHACO AND INTRAOCULAR LENS PLACEMENT RIGHT EYE;  Surgeon: Tonny Branch, MD;  Location: [facility];  Service: Ophthalmology;  Laterality: Right;  CDE: 6.79  . HERNIA REPAIR    . LUNG SURGERY    . VASCULAR SURGERY      Current Outpatient Rx  . Order #: [id number] Class: Historical Med  . Order #: [id number] Class: Historical Med  . Order #: [id number] Class: Historical Med    Allergies Patient has no known allergies.  Family History  Problem Relation Age of Onset  . Cancer Mother     Social History Social History   Tobacco Use  . Smoking status: Current Every Day Smoker    Packs/day: 0.50    Years: 50.00    Pack years: 25.00  . Smokeless tobacco: Never Used  Substance Use Topics  . Alcohol use: No  . Drug use: No    Review of Systems  Constitutional: No fever/chills Eyes: No visual changes. ENT: No sore throat. Cardiovascular: Denies chest pain. Positive B/L LE swelling.  Respiratory: Positive shortness of breath. Gastrointestinal: No abdominal pain.  No nausea, no vomiting.  No diarrhea.  No constipation. Genitourinary: Negative for dysuria. Musculoskeletal: Negative for back pain. Skin: Negative for rash. Neurological: Negative for headaches, focal weakness or numbness.  10-point ROS otherwise negative.  ____________________________________________   PHYSICAL EXAM:  VITAL SIGNS: ED Triage  Vitals  Enc Vitals Group     BP 04/04/17 1839 (!) 143/76     Pulse Rate 04/04/17 1837 93     Resp 04/04/17 1837 (!) 24     Temp 04/04/17 1837 (!) 97.4 F (36.3 C)     Temp Source 04/04/17 1837 Oral     SpO2 04/04/17 1837 95 %     Weight 04/04/17 1835 135 lb 8 oz (61.5 kg)     Height 04/04/17 1835 5\' 6"  (1.676 m)     Pain Score 04/04/17 1834 10   Constitutional: Alert and oriented. Well appearing and in no acute  distress. Eyes: Conjunctivae are normal.  Head: Atraumatic. Nose: No congestion/rhinnorhea. Mouth/Throat: Mucous membranes are moist.  Neck: No stridor.  Cardiovascular: Normal rate, regular rhythm. Good peripheral circulation. Grossly normal heart sounds.   Respiratory: Positive respiratory effort.  No retractions. Lungs with diffuse end-expiratory wheezing.  Gastrointestinal: Soft and nontender. No distention.  Musculoskeletal: No lower extremity tenderness. 3+ pitting edema in the B/L LEs. No cellulitis. No gross deformities of extremities. Neurologic:  Normal speech and language. No gross focal neurologic deficits are appreciated.  Skin:  Skin is warm, dry and intact. No rash noted.   ____________________________________________   LABS (all labs ordered are listed, but only abnormal results are displayed)  Labs Reviewed  BASIC METABOLIC PANEL - Abnormal; Notable for the following components:      Result Value   Chloride 100 (*)    Glucose, Bld 106 (*)    All other components within normal limits  BRAIN NATRIURETIC PEPTIDE - Abnormal; Notable for the following components:   B Natriuretic Peptide 3,142.0 (*)    All other components within normal limits  CBC WITH DIFFERENTIAL/PLATELET - Abnormal; Notable for the following components:   RBC 3.99 (*)    Hemoglobin 11.2 (*)    HCT 35.8 (*)    All other components within normal limits  TROPONIN I   ____________________________________________  EKG   EKG Interpretation  Date/Time:  Thursday April 04 2017 19:13:55 EDT Ventricular Rate:  80 PR Interval:    QRS Duration: 88 QT Interval:  399 QTC Calculation: 461 R Axis:   78 Text Interpretation:  Sinus rhythm Probable left atrial enlargement LVH with secondary repolarization abnormality No STEMI.  Similar to prior tracing.  Confirmed by Nanda Quinton [phone]) on 04/04/2017 11:17:45 PM       ____________________________________________  RADIOLOGY  Dg Chest 2  View  Result Date: 04/04/2017 CLINICAL DATA:  Shortness of breath for 1 month. Lower extremity swelling. Asthma. EXAM: CHEST - 2 VIEW COMPARISON:  02/12/2017 and 12/27/2016 FINDINGS: Stable cardiomegaly. Aortic atherosclerosis. Stable postop changes from left thoracotomy. Extensive left lung volume loss and pleural-parenchymal scarring again seen. There is increased opacity in the posterior left retrocardiac lung base, which could be due to pulmonary consolidation or pleural effusion. Moderate upper thoracic levoscoliosis is also unchanged. IMPRESSION: Extensive postsurgical scarring in left hemithorax. Possible increased pleural effusion or consolidation in posterior left lung base. Electronically Signed   By: Earle Gell M.D.   On: 04/04/2017 19:46   Ct Chest W Contrast  Result Date: 04/04/2017 CLINICAL DATA:  Swelling in the legs and feet with aching for couple of days. Difficulty breathing. EXAM: CT CHEST WITH CONTRAST TECHNIQUE: Multidetector CT imaging of the chest was performed during intravenous contrast administration. CONTRAST:  35mL ISOVUE-300 IOPAMIDOL (ISOVUE-300) INJECTION 61% COMPARISON:  Chest radiograph 04/04/2017.  CT chest 07/02/2007 FINDINGS: Cardiovascular: Diffuse cardiac enlargement with four-chamber dilatation. Reflux  of contrast material into the hepatic veins may indicate right heart failure. No pericardial effusion. No aortic aneurysm. No aortic dissection. Great vessel origins are patent. Visualized central pulmonary arteries are patent without evidence of pulmonary embolus. Hazy decreased opacity in the distal left pulmonary artery probably represents slow flow related to postoperative change. This appearance is similar to previous study. Mediastinum/Nodes: No significant lymphadenopathy. Esophagus is mostly decompressed. Lungs/Pleura: Postoperative left lower lobectomy. Prominent scarring in the remaining left upper lung. Appearance of the left lung is similar to previous study.  Compensatory hyperinflation of the right lung. Small right pleural effusion with atelectasis or infiltration in the right lung base. Mild interstitial septal prominence in the right lung may indicate interstitial edema. Right lung changes are new since previous study. Airways are patent. There is a nodular filling defect in the left mainstem bronchus measuring 8 mm. This is new since previous study and could represent a developing endobronchial lesion. Consider bronchoscopy if clinically indicated. Peribronchial thickening suggesting acute or chronic bronchitis. No pneumothorax. Upper Abdomen: Limited visualization. No acute abnormalities identified. Musculoskeletal: Postoperative thoracotomy changes in the left ribs. No destructive bone lesions. IMPRESSION: 1. Postoperative left lower lobectomy with prominent scarring and volume loss in the remaining left upper lung. Appearance is similar to previous study. 2. Small right pleural effusion with probable interstitial edema in the right lung. This is developing since the previous study. 3. Diffuse cardiac enlargement. Reflux of contrast material into the hepatic veins suggest right heart failure. 4. Airways are patent but diffuse bronchial wall thickening suggests acute or chronic bronchitis. There is a new 8 mm filling defect in the left mainstem bronchus which could indicate a recurrent endobronchial lesion. Consider bronchoscopy if clinically indicated. 5. Hazy decreased opacity in the distal left pulmonary artery probably represents slow flow related to postoperative change. Electronically Signed   By: Lucienne Capers M.D.   On: 04/04/2017 22:51    ____________________________________________   PROCEDURES  Procedure(s) performed:   Procedures  None ____________________________________________   INITIAL IMPRESSION / ASSESSMENT AND PLAN / ED COURSE  Pertinent labs & imaging results that were available during my care of the patient were reviewed by  me and considered in my medical decision making (see chart for details).  Patient presents to the emergency department with bilateral lower extremity edema and worsening dyspnea over the past 2-3 days.  He does not take Lasix or other diuretic.  He does have an echocardiogram from 2014 showing an ejection fraction of 40-45%.  He denies any knowledge of a CHF diagnosis or treatment in the past.  Doubt atypical ACS or infection.  She does have significant end expiratory wheezing consistent with his emphysema history.  This wheezing makes it difficult to appreciate faint crackles on exam but clinically his symptoms seem more consistent with volume overload.   Abs reviewed.  Patient's BNP is greater than 3000.  CT scan shows interstitial edema without infiltrate.  Patient given Lasix.  With significant symptoms he would benefit from inpatient diuresis and repeat echo for comparison to last study in 2014.  Discussed patient's case with Hospitalist, Dr. Darrick Meigs to request admission. Patient and family (if present) updated with plan. Care transferred to [facility] service.  I reviewed all nursing notes, vitals, pertinent old records, EKGs, labs, imaging (as available).  ____________________________________________  FINAL CLINICAL IMPRESSION(S) / ED DIAGNOSES  Final diagnoses:  Acute on chronic congestive heart failure, unspecified heart failure type ([place])     MEDICATIONS GIVEN DURING THIS VISIT:  Medications  ipratropium-albuterol (DUONEB) 0.5-2.5 (3) MG/3ML nebulizer solution 3 mL (3 mLs Nebulization Given 04/04/17 1946)  iopamidol (ISOVUE-300) 61 % injection 75 mL (75 mLs Intravenous Contrast Given 04/04/17 2220)  furosemide (LASIX) injection 60 mg (60 mg Intravenous Given 04/04/17 2236)     Note:  This document was prepared using Dragon voice recognition software and may include unintentional dictation errors.  Nanda Quinton, MD Emergency Medicine    Lameeka Schleifer, [facility], MD 04/04/17 [phone]

## 2017-04-05 ENCOUNTER — Inpatient Hospital Stay (HOSPITAL_COMMUNITY): Payer: Medicare Other

## 2017-04-05 ENCOUNTER — Encounter (HOSPITAL_COMMUNITY): Payer: Self-pay

## 2017-04-05 DIAGNOSIS — Z961 Presence of intraocular lens: Secondary | ICD-10-CM | POA: Diagnosis not present

## 2017-04-05 DIAGNOSIS — Z902 Acquired absence of lung [part of]: Secondary | ICD-10-CM | POA: Diagnosis not present

## 2017-04-05 DIAGNOSIS — Z9119 Patient's noncompliance with other medical treatment and regimen: Secondary | ICD-10-CM

## 2017-04-05 DIAGNOSIS — K219 Gastro-esophageal reflux disease without esophagitis: Secondary | ICD-10-CM | POA: Diagnosis not present

## 2017-04-05 DIAGNOSIS — F17210 Nicotine dependence, cigarettes, uncomplicated: Secondary | ICD-10-CM | POA: Diagnosis not present

## 2017-04-05 DIAGNOSIS — I110 Hypertensive heart disease with heart failure: Secondary | ICD-10-CM | POA: Diagnosis not present

## 2017-04-05 DIAGNOSIS — I10 Essential (primary) hypertension: Secondary | ICD-10-CM

## 2017-04-05 DIAGNOSIS — I351 Nonrheumatic aortic (valve) insufficiency: Secondary | ICD-10-CM

## 2017-04-05 DIAGNOSIS — J439 Emphysema, unspecified: Secondary | ICD-10-CM | POA: Diagnosis not present

## 2017-04-05 DIAGNOSIS — I340 Nonrheumatic mitral (valve) insufficiency: Secondary | ICD-10-CM | POA: Diagnosis not present

## 2017-04-05 DIAGNOSIS — N400 Enlarged prostate without lower urinary tract symptoms: Secondary | ICD-10-CM | POA: Diagnosis not present

## 2017-04-05 DIAGNOSIS — N401 Enlarged prostate with lower urinary tract symptoms: Secondary | ICD-10-CM

## 2017-04-05 DIAGNOSIS — Z9842 Cataract extraction status, left eye: Secondary | ICD-10-CM | POA: Diagnosis not present

## 2017-04-05 DIAGNOSIS — I428 Other cardiomyopathies: Secondary | ICD-10-CM

## 2017-04-05 DIAGNOSIS — I739 Peripheral vascular disease, unspecified: Secondary | ICD-10-CM | POA: Diagnosis not present

## 2017-04-05 DIAGNOSIS — I5043 Acute on chronic combined systolic (congestive) and diastolic (congestive) heart failure: Secondary | ICD-10-CM | POA: Diagnosis not present

## 2017-04-05 DIAGNOSIS — Z9841 Cataract extraction status, right eye: Secondary | ICD-10-CM | POA: Diagnosis not present

## 2017-04-05 DIAGNOSIS — I509 Heart failure, unspecified: Secondary | ICD-10-CM | POA: Diagnosis present

## 2017-04-05 DIAGNOSIS — Z79891 Long term (current) use of opiate analgesic: Secondary | ICD-10-CM | POA: Diagnosis not present

## 2017-04-05 DIAGNOSIS — Z8249 Family history of ischemic heart disease and other diseases of the circulatory system: Secondary | ICD-10-CM | POA: Diagnosis not present

## 2017-04-05 LAB — COMPREHENSIVE METABOLIC PANEL
ALT: 36 U/L (ref 17–63)
AST: 26 U/L (ref 15–41)
Albumin: 3.8 g/dL (ref 3.5–5.0)
Alkaline Phosphatase: 50 U/L (ref 38–126)
Anion gap: 12 (ref 5–15)
BUN: 20 mg/dL (ref 6–20)
CHLORIDE: 101 mmol/L (ref 101–111)
CO2: 32 mmol/L (ref 22–32)
Calcium: 9 mg/dL (ref 8.9–10.3)
Creatinine, Ser: 1.14 mg/dL (ref 0.61–1.24)
GFR calc Af Amer: >60 mL/min (ref >60)
Glucose, Bld: 108 mg/dL — ABNORMAL HIGH (ref 65–99)
POTASSIUM: 3.9 mmol/L (ref 3.5–5.1)
SODIUM: 145 mmol/L (ref 135–145)
Total Bilirubin: 0.7 mg/dL (ref 0.3–1.2)
Total Protein: 6.8 g/dL (ref 6.5–8.1)

## 2017-04-05 LAB — ECHOCARDIOGRAM COMPLETE
HEIGHTINCHES: 66 in
Weight: 1958.4 oz

## 2017-04-05 LAB — CBC
HCT: 41.6 % (ref 39.0–52.0)
Hemoglobin: 12.8 g/dL — ABNORMAL LOW (ref 13.0–17.0)
MCH: 27.7 pg (ref 26.0–34.0)
MCHC: 30.8 g/dL (ref 30.0–36.0)
MCV: 90 fL (ref 78.0–100.0)
PLATELETS: 326 10*3/uL (ref 150–400)
RBC: 4.62 MIL/uL (ref 4.22–5.81)
RDW: 14.5 % (ref 11.5–15.5)
WBC: 8.2 10*3/uL (ref 4.0–10.5)

## 2017-04-05 LAB — TROPONIN I: TROPONIN I: 0.03 ng/mL — AB (ref <0.03)

## 2017-04-05 MED ORDER — SODIUM CHLORIDE 0.9% FLUSH
3.0000 mL | INTRAVENOUS | Status: DC | PRN
  Administered 2017-04-05: 3 mL via INTRAVENOUS
  Filled 2017-04-05: qty 3

## 2017-04-05 MED ORDER — ENOXAPARIN SODIUM 40 MG/0.4ML ~~LOC~~ SOLN
40.0000 mg | SUBCUTANEOUS | Status: DC
  Administered 2017-04-05: 40 mg via SUBCUTANEOUS
  Filled 2017-04-05 (×2): qty 0.4

## 2017-04-05 MED ORDER — ALBUTEROL SULFATE (2.5 MG/3ML) 0.083% IN NEBU
3.0000 mL | INHALATION_SOLUTION | Freq: Four times a day (QID) | RESPIRATORY_TRACT | Status: DC | PRN
Start: 2017-04-05 — End: 2017-04-06

## 2017-04-05 MED ORDER — METOPROLOL SUCCINATE ER 25 MG PO TB24
12.5000 mg | ORAL_TABLET | Freq: Every day | ORAL | Status: DC
  Administered 2017-04-05 – 2017-04-06 (×2): 12.5 mg via ORAL
  Filled 2017-04-05 (×2): qty 1

## 2017-04-05 MED ORDER — ONDANSETRON HCL 4 MG PO TABS: 4.0000 mg | ORAL_TABLET | Freq: Four times a day (QID) | ORAL | Status: DC | PRN

## 2017-04-05 MED ORDER — LOSARTAN POTASSIUM 50 MG PO TABS
25.0000 mg | ORAL_TABLET | Freq: Every day | ORAL | Status: DC
  Administered 2017-04-05 – 2017-04-06 (×2): 25 mg via ORAL
  Filled 2017-04-05 (×2): qty 1

## 2017-04-05 MED ORDER — SODIUM CHLORIDE 0.9% FLUSH
3.0000 mL | Freq: Two times a day (BID) | INTRAVENOUS | Status: DC
  Administered 2017-04-05 (×3): 3 mL via INTRAVENOUS

## 2017-04-05 MED ORDER — TAMSULOSIN HCL 0.4 MG PO CAPS
0.4000 mg | ORAL_CAPSULE | Freq: Every day | ORAL | Status: DC
  Administered 2017-04-05 (×2): 0.4 mg via ORAL
  Filled 2017-04-05 (×2): qty 1

## 2017-04-05 MED ORDER — FUROSEMIDE 10 MG/ML IJ SOLN
40.0000 mg | Freq: Two times a day (BID) | INTRAMUSCULAR | Status: DC
  Administered 2017-04-05 – 2017-04-06 (×3): 40 mg via INTRAVENOUS
  Filled 2017-04-05 (×3): qty 4

## 2017-04-05 MED ORDER — ONDANSETRON HCL 4 MG/2ML IJ SOLN: 4.0000 mg | Freq: Four times a day (QID) | INTRAMUSCULAR | Status: DC | PRN

## 2017-04-05 MED ORDER — OXYCODONE HCL 5 MG PO TABS
5.0000 mg | ORAL_TABLET | Freq: Every day | ORAL | Status: DC
  Administered 2017-04-05 (×2): 5 mg via ORAL
  Filled 2017-04-05 (×3): qty 1

## 2017-04-05 MED ORDER — SODIUM CHLORIDE 0.9 % IV SOLN: 250.0000 mL | INTRAVENOUS | Status: DC | PRN

## 2017-04-05 NOTE — Consult Note (Addendum)
Cardiology Consult    Patient ID: James Cervantes; [id number]; [birth year]   Admit date: 04/04/2017 Date of Consult: 04/05/2017  Primary Care Provider: Lucia Gaskins, MD Primary Cardiologist: Evaluated by Dr. Domenic Polite in 2014  Patient Profile    James Cervantes is a 67 y.o. male with past medical history of cardiomyopathy (EF 40-45% by echo in 2014), HTN, COPD, prior partial lung resection, and tobacco use who is being seen today for the evaluation of CHF at the request of Dr. Darrick Meigs.   History of Present Illness    James Cervantes was last evaluated by Cardiology in 2014 during an admission for productive cough, congestion, and chest discomfort. His pain had been constant for over 4 days and cyclic troponin values remained negative. EKG did show inferior TWI which had resolved by repeat tracings. An echocardiogram was obtained and showed a reduced EF of 40-45% with diffuse hypokinesis and grade 2 diastolic dysfunction. His cardiomyopathy was thought to possibly be nonischemic in the setting of prior alcohol abuse. He was not started on beta-blocker therapy due to resting bradycardia and was informed to follow-up with his PCP and Cardiology in the outpatient setting. He has not been evaluated by Cardiology since.   He presented to [facility] ED on 04/04/2017 for evaluation of worsening dyspnea on exertion and lower extremity edema over the past several days. In talking with the patient today, he reports having noticed orthopnea and PND for the past several weeks. He was using his albuterol inhaler with minimal improvement in his symptoms. Over the past several days, he developed lower extremity edema. He denies any exertional chest discomfort but does report chest pain and palpitations when he feels like he cannot catch his breath. Reports this improves with consumption of water.  No known history of HTN, HLD, or Type 2 DM. No known family history of CAD. He does smoke 0.5 ppd and has done so for 40+ years.  Says he quit smoking two weeks ago when he developed worsening dyspnea. Denies any alcohol use within the past 20 years. No recreational drug use.   Initial labs showed WBC 7.4, Hgb 11.2, platelets 301, Na+ 135, K+ 4.4, and creatinine 1.05.  Initial and cyclic troponin values have been negative. BNP elevated to 3142. CXR shows possible increased pleural effusion or consolidation in the posterior left lung base with extensive postsurgical scarring in the left hemithorax. Chest CT shows small right pleural effusion with probable interstitial edema along the right lung and diffuse bronchial wall thickening suggesting acute or chronic bronchitis with a new 8 mm defect in the left mainstem bronchus. EKG shows NSR, HR 80, with LVH and diffuse ST abnormalities along lateral leads (similar to prior tracings from 2018 and 2019).   He has been started on IV Lasix 40mg  BID with an overall net output of -2.4L. Creatinine has remained stable at 1.14.   Past Medical History:  Diagnosis Date  . Anemia   . Arthritis   . Asthma   . Cardiomyopathy ([place])    a. 2014: EF 40-45%, diffuse HK, and Grade 2 DD  . Dyspnea   . Emphysema   . GERD (gastroesophageal reflux disease)   . Hip pain   . Peripheral vascular disease ([place])   . Prostate disorder     Past Surgical History:  Procedure Laterality Date  . ABDOMINAL SURGERY    . CATARACT EXTRACTION W/PHACO Left 11/26/2016   Procedure: CATARACT EXTRACTION PHACO AND INTRAOCULAR LENS PLACEMENT LEFT EYE;  Surgeon: Tonny Branch, MD;  Location: [facility];  Service: Ophthalmology;  Laterality: Left;  CDE: 26.53   . CATARACT EXTRACTION W/PHACO Right 01/28/2017   Procedure: CATARACT EXTRACTION PHACO AND INTRAOCULAR LENS PLACEMENT RIGHT EYE;  Surgeon: Tonny Branch, MD;  Location: [facility];  Service: Ophthalmology;  Laterality: Right;  CDE: 6.79  . HERNIA REPAIR    . LUNG SURGERY    . VASCULAR SURGERY       Home Medications:  Prior to Admission medications   Medication Sig  Start Date End Date Taking? Authorizing Provider  albuterol (PROAIR HFA) 108 (90 BASE) MCG/ACT inhaler Inhale 2 puffs into the lungs every 6 (six) hours as needed for wheezing or shortness of breath.   Yes [provider]  tamsulosin (FLOMAX) 0.4 MG CAPS capsule Take 0.4 mg by mouth at bedtime.    Yes [provider]  Oxycodone HCl 10 MG TABS Take 5 mg by mouth at bedtime.     [provider]    Inpatient Medications: Scheduled Meds: . enoxaparin (LOVENOX) injection  40 mg Subcutaneous Q24H  . furosemide  40 mg Intravenous Q12H  . oxyCODONE  5 mg Oral QHS  . sodium chloride flush  3 mL Intravenous Q12H  . tamsulosin  0.4 mg Oral QHS   Continuous Infusions: . sodium chloride     PRN Meds: sodium chloride, albuterol, ondansetron **OR** ondansetron (ZOFRAN) IV, sodium chloride flush  Allergies:   No Known Allergies  Social History:   Social History   Socioeconomic History  . Marital status: Legally Separated    Spouse name: Not on file  . Number of children: Not on file  . Years of education: Not on file  . Highest education level: Not on file  Occupational History  . Not on file  Social Needs  . Financial resource strain: Not on file  . Food insecurity:    Worry: Not on file    Inability: Not on file  . Transportation needs:    Medical: Not on file    Non-medical: Not on file  Tobacco Use  . Smoking status: Current Every Day Smoker    Packs/day: 0.50    Years: 50.00    Pack years: 25.00  . Smokeless tobacco: Never Used  Substance and Sexual Activity  . Alcohol use: No  . Drug use: No  . Sexual activity: Yes    Birth control/protection: Condom  Lifestyle  . Physical activity:    Days per week: Not on file    Minutes per session: Not on file  . Stress: Not on file  Relationships  . Social connections:    Talks on phone: Not on file    Gets together: Not on file    Attends religious service: Not on file    Active member of club or  organization: Not on file    Attends meetings of clubs or organizations: Not on file    Relationship status: Not on file  . Intimate partner violence:    Fear of current or ex partner: Not on file    Emotionally abused: Not on file    Physically abused: Not on file    Forced sexual activity: Not on file  Other Topics Concern  . Not on file  Social History Narrative  . Not on file     Family History:    Family History  Problem Relation Age of Onset  . Cancer Mother   . Hypertension Mother  Review of Systems    General:  No chills, fever, night sweats or weight changes.  Cardiovascular:  No palpitations. Positive for chest pain, dyspnea on exertion, edema, orthopnea, and PND.  Dermatological: No rash, lesions/masses Respiratory: No cough, Positive for dyspnea. Urologic: No hematuria, dysuria Abdominal:   No nausea, vomiting, diarrhea, bright red blood per rectum, melena, or hematemesis Neurologic:  No visual changes, wkns, changes in mental status.  All other systems reviewed and are otherwise negative except as noted above.  Physical Exam/Data    Vitals:   04/04/17 2100 04/04/17 2115 04/05/17 0115 04/05/17 0205  BP: (!) 156/98   (!) 148/102  Pulse: 75   80  Resp: (!) 26 20 15 20   Temp:    97.8 F (36.6 C)  TempSrc:    Oral  SpO2: 99%   100%  Weight:    122 lb 6.4 oz (55.5 kg)  Height:    5\' 6"  (1.676 m)    Intake/Output Summary (Last 24 hours) at 04/05/2017 0857 Last data filed at 04/05/2017 0600 Gross per 24 hour  Intake 480 ml  Output 2900 ml  Net -2420 ml   Filed Weights   04/04/17 1835 04/05/17 0205  Weight: 135 lb 8 oz (61.5 kg) 122 lb 6.4 oz (55.5 kg)   Body mass index is 19.76 kg/m.   General: Pleasant, thin African American male appearing in NAD. Psych: Normal affect. Neuro: Alert and oriented X 3. Moves all extremities spontaneously. HEENT: Normal  Neck: Supple without bruits. JVD elevated. Lungs:  Resp regular and unlabored, rales along  bases bilaterally Heart: RRR no s3, s4, or murmurs. Abdomen: Soft, non-tender, non-distended, BS + x 4.  Extremities: No clubbing or cyanosis. 1+ pitting edema along lower extremities. DP/PT/Radials 2+ and equal bilaterally.   EKG:  The EKG was personally reviewed and demonstrates: NSR, HR 80, with LVH and diffuse ST abnormalities along lateral leads (similar to prior tracings from 2018 and 2019).    Labs/Studies     Relevant CV Studies:  Echocardiogram: 10/13/2012 Study Conclusions  - Left ventricle: The cavity size was at the upper limits of normal. Wall thickness was normal. Systolic function was mildly to moderately reduced. The estimated ejection fraction was in the range of 40% to 45%. Diffuse hypokinesis. There is more prominent hypokinesis of the inferolateral myocardium. Features are consistent with a pseudonormal left ventricular filling pattern, with concomitant abnormal relaxation and increased filling pressure (grade 2 diastolic dysfunction). - Mitral valve: Mildly thickened leaflets . Trivial regurgitation. - Left atrium: The atrium was at the upper limits of normal in size. - Right atrium: Central venous pressure: 33mm Hg (est). - Tricuspid valve: Mild regurgitation. - Pulmonary arteries: PA peak pressure: 61mm Hg (S). - Pericardium, extracardiac: A trivial pericardial effusion was identified. Impressions:  - No prior study for comparison. Upper normal LV chamber size with LVEF 40-45%, diffuse hypokinesis - most prominent in the inferolateral wall, grade 2 diastolic dysfunction. Upper normal left atrial size. Mild tricuspid regurgitation with PASP 28 mmHg. Trivial pericardial effusion.  Laboratory Data:  Chemistry Recent Labs  Lab 04/04/17 1908 04/05/17 0201  NA 135 145  K 4.4 3.9  CL 100* 101  CO2 25 32  GLUCOSE 106* 108*  BUN 20 20  CREATININE 1.05 1.14  CALCIUM 8.9 9.0  GFRNONAA >60 >60  GFRAA >60 >60    ANIONGAP 10 12    Recent Labs  Lab 04/05/17 0201  PROT 6.8  ALBUMIN 3.8  AST 26  ALT  36  ALKPHOS 50  BILITOT 0.7   Hematology Recent Labs  Lab 04/04/17 1908 04/05/17 0201  WBC 7.4 8.2  RBC 3.99* 4.62  HGB 11.2* 12.8*  HCT 35.8* 41.6  MCV 89.7 90.0  MCH 28.1 27.7  MCHC 31.3 30.8  RDW 14.5 14.5  PLT 301 326   Cardiac Enzymes Recent Labs  Lab 04/04/17 1908 04/05/17 0201 04/05/17 0802  TROPONINI <0.03 <0.03 0.03*   No results for input(s): TROPIPOC in the last 168 hours.  BNP Recent Labs  Lab 04/04/17 1909  BNP 3,142.0*    DDimer No results for input(s): DDIMER in the last 168 hours.  Radiology/Studies:  Dg Chest 2 View  Result Date: 04/04/2017 CLINICAL DATA:  Shortness of breath for 1 month. Lower extremity swelling. Asthma. EXAM: CHEST - 2 VIEW COMPARISON:  02/12/2017 and 12/27/2016 FINDINGS: Stable cardiomegaly. Aortic atherosclerosis. Stable postop changes from left thoracotomy. Extensive left lung volume loss and pleural-parenchymal scarring again seen. There is increased opacity in the posterior left retrocardiac lung base, which could be due to pulmonary consolidation or pleural effusion. Moderate upper thoracic levoscoliosis is also unchanged. IMPRESSION: Extensive postsurgical scarring in left hemithorax. Possible increased pleural effusion or consolidation in posterior left lung base. Electronically Signed   By: Earle Gell M.D.   On: 04/04/2017 19:46   Ct Chest W Contrast  Result Date: 04/04/2017 CLINICAL DATA:  Swelling in the legs and feet with aching for couple of days. Difficulty breathing. EXAM: CT CHEST WITH CONTRAST TECHNIQUE: Multidetector CT imaging of the chest was performed during intravenous contrast administration. CONTRAST:  53mL ISOVUE-300 IOPAMIDOL (ISOVUE-300) INJECTION 61% COMPARISON:  Chest radiograph 04/04/2017.  CT chest 07/02/2007 FINDINGS: Cardiovascular: Diffuse cardiac enlargement with four-chamber dilatation. Reflux of contrast  material into the hepatic veins may indicate right heart failure. No pericardial effusion. No aortic aneurysm. No aortic dissection. Great vessel origins are patent. Visualized central pulmonary arteries are patent without evidence of pulmonary embolus. Hazy decreased opacity in the distal left pulmonary artery probably represents slow flow related to postoperative change. This appearance is similar to previous study. Mediastinum/Nodes: No significant lymphadenopathy. Esophagus is mostly decompressed. Lungs/Pleura: Postoperative left lower lobectomy. Prominent scarring in the remaining left upper lung. Appearance of the left lung is similar to previous study. Compensatory hyperinflation of the right lung. Small right pleural effusion with atelectasis or infiltration in the right lung base. Mild interstitial septal prominence in the right lung may indicate interstitial edema. Right lung changes are new since previous study. Airways are patent. There is a nodular filling defect in the left mainstem bronchus measuring 8 mm. This is new since previous study and could represent a developing endobronchial lesion. Consider bronchoscopy if clinically indicated. Peribronchial thickening suggesting acute or chronic bronchitis. No pneumothorax. Upper Abdomen: Limited visualization. No acute abnormalities identified. Musculoskeletal: Postoperative thoracotomy changes in the left ribs. No destructive bone lesions. IMPRESSION: 1. Postoperative left lower lobectomy with prominent scarring and volume loss in the remaining left upper lung. Appearance is similar to previous study. 2. Small right pleural effusion with probable interstitial edema in the right lung. This is developing since the previous study. 3. Diffuse cardiac enlargement. Reflux of contrast material into the hepatic veins suggest right heart failure. 4. Airways are patent but diffuse bronchial wall thickening suggests acute or chronic bronchitis. There is a new 8 mm  filling defect in the left mainstem bronchus which could indicate a recurrent endobronchial lesion. Consider bronchoscopy if clinically indicated. 5. Hazy decreased opacity in the distal left  pulmonary artery probably represents slow flow related to postoperative change. Electronically Signed   By: Lucienne Capers M.D.   On: 04/04/2017 22:51     Assessment & Plan    1. Acute on Chronic Combined Systolic and Diastolic CHF - the patient has a known reduced EF of 40-45% by echo in 2014 which was thought to possibly be nonischemic in the setting of prior alcohol abuse.  - presented with worsening dyspnea on exertion, orthopnea, and edema over the past several weeks. BNP elevated to 3142 and CXR consistent with CHF. Initial and cyclic troponin values have been flat at 0.03. EKG shows NSR, HR 80, with LVH and diffuse ST abnormalities along lateral leads (similar to prior tracings from 2018 and 2019).  - continue with IV Lasix 40mg  BID as he has an overall net output of -2.4L thus far and remains volume overloaded by physical examination. Will require standing dose of Lasix at the time of discharge.  - a repeat echocardiogram is pending to assess LV function and wall motion. If EF remains reduced, would recommended addition of Toprol-XL 25mg  daily and consideration of ARB therapy pending BP response. If EF significantly reduced, will need to consider ischemic evaluation which could likely be performed as an outpatient.   2. HTN - BP has been variable at 143/76 - 156/102 within the past 24 hours. Not on any anti-hypertensive medications prior to admission.  - anticipate addition of BB therapy and ARB as outlined above.   3. COPD/ Abnormal Chest CT  - Chest CT shows small right pleural effusion with probable interstitial edema along the right lung and diffuse bronchial wall thickening suggesting acute or chronic bronchitis with a new 8 mm defect in the left mainstem bronchus. - will need further evaluation  as an outpatient.   4. Tobacco Use - he has over a 50 pack-year history. He quit smoking two weeks PTA due to worsening dyspnea. Continued cessation advised.   For questions or updates, please contact Emma Please consult [email or web address] for contact info under Cardiology/STEMI.  Signed, Erma Heritage, PA-C 04/05/2017, 8:57 AM Pager: [phone]   Attending note:  Patient seen and examined.  Reviewed records and discussed the case with Ms. Delano Metz.  Mr. Hymes presents with recent worsening leg swelling and discomfort, also dyspnea on exertion.  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  On examination he appears comfortable and in no distress.  Heart rate is in the 70s-80s in sinus rhythm by telemetry, blood pressure elevated with systolics in the 580D-983J.  Lungs exhibit a few crackles at the bases and prolonged expiratory phase with mild wheezing.  Cardiac exam reveals an distinct PMI with RRR and ectopy.  He has bilateral leg edema 2-3+.  Lab work shows potassium 3.9, BUN 20, creatinine 1.14, troponin I 0.03, BNP 3142, hemoglobin 12.8, platelets 326.  Previous echocardiogram in 2014 revealed LVEF 40-45% with diffuse hypokinesis and moderate diastolic dysfunction.  Follow-up study is pending.  Acute on chronic combined heart failure with history of nonischemic cardiomyopathy.  Follow-up echocardiogram is pending.  This is complicated by medication and follow-up noncompliance.  He is currently on IV Lasix with diuresis of approximately 2400 cc so far.  Plan to empirically start low-dose Toprol-XL as well as ARB.  Further titrations are anticipated.  Satira Sark, M.D., F.A.C.C.

## 2017-04-05 NOTE — Progress Notes (Signed)
Subjective: He was admitted last night with heart failure exacerbation.  He says he feels much better.  He still has swelling of his legs.  His orthopnea is much improved.  He is -2.4 L.  He says he did have some chest pain last week.  Objective: Vital signs in last 24 hours: Temp:  [97.4 F (36.3 C)-97.8 F (36.6 C)] 97.8 F (36.6 C) (04/05 0205) Pulse Rate:  [75-93] 80 (04/05 0205) Resp:  [15-26] 20 (04/05 0205) BP: (143-156)/(76-102) 148/102 (04/05 0205) SpO2:  [95 %-100 %] 100 % (04/05 0205) Weight:  [55.5 kg (122 lb 6.4 oz)-61.5 kg (135 lb 8 oz)] 55.5 kg (122 lb 6.4 oz) (04/05 0205) Weight change:  Last BM Date: 04/04/17  Intake/Output from previous day: 04/04 0701 - 04/05 0700 In: 480 [P.O.:480] Out: 2900 [Urine:2900]  PHYSICAL EXAM General appearance: alert, cooperative and no distress Resp: rales bibasilar Cardio: regular rate and rhythm, S1, S2 normal, no murmur, click, rub or gallop GI: soft, non-tender; bowel sounds normal; no masses,  no organomegaly Extremities: He still has 2+ edema of his calves and feet  Lab Results:  Results for orders placed or performed during the hospital encounter of 04/04/17 (from the past 48 hour(s))  Basic metabolic panel     Status: Abnormal   Collection Time: 04/04/17  7:08 PM  Result Value Ref Range   Sodium 135 135 - 145 mmol/L   Potassium 4.4 3.5 - 5.1 mmol/L   Chloride 100 (L) 101 - 111 mmol/L   CO2 25 22 - 32 mmol/L   Glucose, Bld 106 (H) 65 - 99 mg/dL   BUN 20 6 - 20 mg/dL   Creatinine, Ser 1.05 0.61 - 1.24 mg/dL   Calcium 8.9 8.9 - 10.3 mg/dL   GFR calc non Af Amer >60 >60 mL/min   GFR calc Af Amer >60 >60 mL/min    Comment: (NOTE) The eGFR has been calculated using the CKD EPI equation. This calculation has not been validated in all clinical situations. eGFR's persistently <60 mL/min signify possible Chronic Kidney Disease.    Anion gap 10 5 - 15    Comment: Performed at [facility], [address],  [place], [place] [address]  CBC with Differential     Status: Abnormal   Collection Time: 04/04/17  7:08 PM  Result Value Ref Range   WBC 7.4 4.0 - 10.5 K/uL   RBC 3.99 (L) 4.22 - 5.81 MIL/uL   Hemoglobin 11.2 (L) 13.0 - 17.0 g/dL   HCT 35.8 (L) 39.0 - 52.0 %   MCV 89.7 78.0 - 100.0 fL   MCH 28.1 26.0 - 34.0 pg   MCHC 31.3 30.0 - 36.0 g/dL   RDW 14.5 11.5 - 15.5 %   Platelets 301 150 - 400 K/uL   Neutrophils Relative % 86 %   Neutro Abs 6.4 1.7 - 7.7 K/uL   Lymphocytes Relative 10 %   Lymphs Abs 0.7 0.7 - 4.0 K/uL   Monocytes Relative 4 %   Monocytes Absolute 0.3 0.1 - 1.0 K/uL   Eosinophils Relative 0 %   Eosinophils Absolute 0.0 0.0 - 0.7 K/uL   Basophils Relative 0 %   Basophils Absolute 0.0 0.0 - 0.1 K/uL    Comment: Performed at [facility], [address], [place], [place] [address]  Troponin I     Status: None   Collection Time: 04/04/17  7:08 PM  Result Value Ref Range   Troponin I <0.03 <0.03 ng/mL  Comment: Performed at [facility], [address], [place], [place] [address]  Brain natriuretic peptide     Status: Abnormal   Collection Time: 04/04/17  7:09 PM  Result Value Ref Range   B Natriuretic Peptide 3,142.0 (H) 0.0 - 100.0 pg/mL    Comment: Performed at [facility], [address] [place], [place] [address]  CBC     Status: Abnormal   Collection Time: 04/05/17  2:01 AM  Result Value Ref Range   WBC 8.2 4.0 - 10.5 K/uL   RBC 4.62 4.22 - 5.81 MIL/uL   Hemoglobin 12.8 (L) 13.0 - 17.0 g/dL   HCT 41.6 39.0 - 52.0 %   MCV 90.0 78.0 - 100.0 fL   MCH 27.7 26.0 - 34.0 pg   MCHC 30.8 30.0 - 36.0 g/dL   RDW 14.5 11.5 - 15.5 %   Platelets 326 150 - 400 K/uL    Comment: Performed at [facility], [address] [place], [place] [address]  Comprehensive metabolic panel     Status: Abnormal   Collection Time: 04/05/17  2:01 AM  Result Value Ref Range   Sodium 145 135 - 145 mmol/L    Comment: DELTA CHECK NOTED   Potassium 3.9 3.5 - 5.1 mmol/L   Chloride 101 101 - 111  mmol/L   CO2 32 22 - 32 mmol/L   Glucose, Bld 108 (H) 65 - 99 mg/dL   BUN 20 6 - 20 mg/dL   Creatinine, Ser 1.14 0.61 - 1.24 mg/dL   Calcium 9.0 8.9 - 10.3 mg/dL   Total Protein 6.8 6.5 - 8.1 g/dL   Albumin 3.8 3.5 - 5.0 g/dL   AST 26 15 - 41 U/L   ALT 36 17 - 63 U/L   Alkaline Phosphatase 50 38 - 126 U/L   Total Bilirubin 0.7 0.3 - 1.2 mg/dL   GFR calc non Af Amer >60 >60 mL/min   GFR calc Af Amer >60 >60 mL/min    Comment: (NOTE) The eGFR has been calculated using the CKD EPI equation. This calculation has not been validated in all clinical situations. eGFR's persistently <60 mL/min signify possible Chronic Kidney Disease.    Anion gap 12 5 - 15    Comment: Performed at [facility], [address], [place], [place] [address]  Troponin I (q 6hr x 3)     Status: None   Collection Time: 04/05/17  2:01 AM  Result Value Ref Range   Troponin I <0.03 <0.03 ng/mL    Comment: Performed at [facility], [address], [place], [place] [address]    ABGS No results for input(s): PHART, PO2ART, TCO2, HCO3 in the last 72 hours.  Invalid input(s): PCO2 CULTURES No results found for this or any previous visit (from the past 240 hour(s)). Studies/Results: Dg Chest 2 View  Result Date: 04/04/2017 CLINICAL DATA:  Shortness of breath for 1 month. Lower extremity swelling. Asthma. EXAM: CHEST - 2 VIEW COMPARISON:  02/12/2017 and 12/27/2016 FINDINGS: Stable cardiomegaly. Aortic atherosclerosis. Stable postop changes from left thoracotomy. Extensive left lung volume loss and pleural-parenchymal scarring again seen. There is increased opacity in the posterior left retrocardiac lung base, which could be due to pulmonary consolidation or pleural effusion. Moderate upper thoracic levoscoliosis is also unchanged. IMPRESSION: Extensive postsurgical scarring in left hemithorax. Possible increased pleural effusion or consolidation in posterior left lung base. Electronically Signed   By: Earle Gell M.D.    On: 04/04/2017 19:46   Ct Chest W Contrast  Result Date: 04/04/2017 CLINICAL DATA:  Swelling in the legs and feet with aching for couple of days. Difficulty breathing. EXAM: CT CHEST WITH CONTRAST TECHNIQUE: Multidetector CT imaging of the chest was performed during intravenous contrast administration. CONTRAST:  45m ISOVUE-300 IOPAMIDOL (ISOVUE-300) INJECTION 61% COMPARISON:  Chest radiograph 04/04/2017.  CT chest 07/02/2007 FINDINGS: Cardiovascular: Diffuse cardiac enlargement with four-chamber dilatation. Reflux of contrast material into the hepatic veins may indicate right heart failure. No pericardial effusion. No aortic aneurysm. No aortic dissection. Great vessel origins are patent. Visualized central pulmonary arteries are patent without evidence of pulmonary embolus. Hazy decreased opacity in the distal left pulmonary artery probably represents slow flow related to postoperative change. This appearance is similar to previous study. Mediastinum/Nodes: No significant lymphadenopathy. Esophagus is mostly decompressed. Lungs/Pleura: Postoperative left lower lobectomy. Prominent scarring in the remaining left upper lung. Appearance of the left lung is similar to previous study. Compensatory hyperinflation of the right lung. Small right pleural effusion with atelectasis or infiltration in the right lung base. Mild interstitial septal prominence in the right lung may indicate interstitial edema. Right lung changes are new since previous study. Airways are patent. There is a nodular filling defect in the left mainstem bronchus measuring 8 mm. This is new since previous study and could represent a developing endobronchial lesion. Consider bronchoscopy if clinically indicated. Peribronchial thickening suggesting acute or chronic bronchitis. No pneumothorax. Upper Abdomen: Limited visualization. No acute abnormalities identified. Musculoskeletal: Postoperative thoracotomy changes in the left ribs. No destructive  bone lesions. IMPRESSION: 1. Postoperative left lower lobectomy with prominent scarring and volume loss in the remaining left upper lung. Appearance is similar to previous study. 2. Small right pleural effusion with probable interstitial edema in the right lung. This is developing since the previous study. 3. Diffuse cardiac enlargement. Reflux of contrast material into the hepatic veins suggest right heart failure. 4. Airways are patent but diffuse bronchial wall thickening suggests acute or chronic bronchitis. There is a new 8 mm filling defect in the left mainstem bronchus which could indicate a recurrent endobronchial lesion. Consider bronchoscopy if clinically indicated. 5. Hazy decreased opacity in the distal left pulmonary artery probably represents slow flow related to postoperative change. Electronically Signed   By: WLucienne CapersM.D.   On: 04/04/2017 22:51    Medications:  Prior to Admission:  Medications Prior to Admission  Medication Sig Dispense Refill Last Dose  . albuterol (PROAIR HFA) 108 (90 BASE) MCG/ACT inhaler Inhale 2 puffs into the lungs every 6 (six) hours as needed for wheezing or shortness of breath.   04/04/2017 at Unknown time  . tamsulosin (FLOMAX) 0.4 MG CAPS capsule Take 0.4 mg by mouth at bedtime.    04/03/2017 at Unknown time  . Oxycodone HCl 10 MG TABS Take 5 mg by mouth at bedtime.    unknown   Scheduled: . enoxaparin (LOVENOX) injection  40 mg Subcutaneous Q24H  . furosemide  40 mg Intravenous Q12H  . oxyCODONE  5 mg Oral QHS  . sodium chloride flush  3 mL Intravenous Q12H  . tamsulosin  0.4 mg Oral QHS   Continuous: . sodium chloride     PMEQ:ASTMHDchloride, albuterol, ondansetron **OR** ondansetron (ZOFRAN) IV, sodium chloride flush  Assesment: He was admitted with acute congestive heart failure.  He is better.  Echocardiogram is pending but chest CT which I have personally reviewed shows four-chamber enlargement.  He is not on any cardiac meds at home.  He  is improved but still has significant  edema  He has COPD and is on albuterol at home.  Chest CT also shows what may be a developing endobronchial lesion in the left main bronchus and that will need further evaluation as an outpatient. Active Problems:   GERD   BPH (benign prostatic hyperplasia)   Acute exacerbation of CHF (congestive heart failure) ([place])    Plan: I do not see any documentation that he has seen the cardiology team at all so I am going to go ahead with cardiology consultation    LOS: 0 days   Genie Wenke L 04/05/2017, 8:36 AM

## 2017-04-05 NOTE — Care Management Important Message (Signed)
Important Message  Patient Details  Name: James Cervantes MRN: [id number] Date of Birth: 09/11/1950   Medicare Important Message Given:  Yes    Shelda Altes 04/05/2017, 10:09 AM

## 2017-04-05 NOTE — H&P (Signed)
TRH H&P    Patient Demographics:    James Cervantes, is a 67 y.o. male  MRN: [id number]  DOB - [birth year]  Admit Date - 04/04/2017  Referring MD/NP/PA: Dr. Laverta [place]  Outpatient Primary MD for the patient is Lucia Gaskins, MD  Patient coming from: Home  Chief complaint-shortness of breath   HPI:    James Cervantes  is a 67 y.o. male, history of COPD, GERD came to ED with complaints of bilateral lower extremity swelling and shortness of breath.  Patient says that he has difficulty laying down in the bed and has to get up and move around as he feels suffocated.  He tried inhalers but did not get any benefit. Also admits to having some intermittent chest pain.  Denies fever chills.  No cough. No nausea vomiting or diarrhea. Denies abdominal pain. In the ED, lab work showed significant elevation of BNP to 3,142.0 Patient is given given 1 dose of IV Lasix 60 mg with very good  diuretic response. No previous history of CAD, echocardiogram from 2014 showed grade 2 diastolic dysfunction, ejection fraction 40-45%  no previous history of stroke or seizures.  Denies dysuria urgency or frequency of urination   Review of systems:    In addition to the HPI above,    All other systems reviewed and are negative.   With Past History of the following :    Past Medical History:  Diagnosis Date  . Anemia   . Arthritis   . Asthma   . Dyspnea   . Emphysema   . GERD (gastroesophageal reflux disease)   . Hip pain   . Peripheral vascular disease ([place])   . Prostate disorder       Past Surgical History:  Procedure Laterality Date  . ABDOMINAL SURGERY    . CATARACT EXTRACTION W/PHACO Left 11/26/2016   Procedure: CATARACT EXTRACTION PHACO AND INTRAOCULAR LENS PLACEMENT LEFT EYE;  Surgeon: Tonny Branch, MD;  Location: [facility];  Service: Ophthalmology;  Laterality: Left;  CDE: 26.53   . CATARACT EXTRACTION W/PHACO Right  01/28/2017   Procedure: CATARACT EXTRACTION PHACO AND INTRAOCULAR LENS PLACEMENT RIGHT EYE;  Surgeon: Tonny Branch, MD;  Location: [facility];  Service: Ophthalmology;  Laterality: Right;  CDE: 6.79  . HERNIA REPAIR    . LUNG SURGERY    . VASCULAR SURGERY        Social History:      Social History   Tobacco Use  . Smoking status: Current Every Day Smoker    Packs/day: 0.50    Years: 50.00    Pack years: 25.00  . Smokeless tobacco: Never Used  Substance Use Topics  . Alcohol use: No       Family History :     Family History  Problem Relation Age of Onset  . Cancer Mother       Home Medications:   Prior to Admission medications   Medication Sig Start Date End Date Taking? Authorizing Provider  albuterol (PROAIR HFA) 108 (90 BASE) MCG/ACT inhaler Inhale 2 puffs into the  lungs every 6 (six) hours as needed for wheezing or shortness of breath.   Yes [provider]  tamsulosin (FLOMAX) 0.4 MG CAPS capsule Take 0.4 mg by mouth at bedtime.    Yes [provider]  Oxycodone HCl 10 MG TABS Take 5 mg by mouth at bedtime.     [provider]     Allergies:    No Known Allergies   Physical Exam:   Vitals  Blood pressure (!) 156/98, pulse 75, temperature (!) 97.4 F (36.3 C), temperature source Oral, resp. rate 20, height 5\' 6"  (1.676 m), weight 61.5 kg (135 lb 8 oz), SpO2 99 %.  1.  General: Appears in no acute distress  2. Psychiatric:  Intact judgement and  insight, awake alert, oriented x 3.  3. Neurologic: No focal neurological deficits, all cranial nerves intact.Strength 5/5 all 4 extremities, sensation intact all 4 extremities, plantars down going.  4. Eyes :  anicteric sclerae, moist conjunctivae with no lid lag. PERRLA.  5. ENMT:  Oropharynx clear with moist mucous membranes and good dentition  6. Neck:  supple, no cervical lymphadenopathy appriciated, No thyromegaly  7. Respiratory : Normal respiratory effort, bibasilar  crackles auscultated  8. Cardiovascular : RRR, no gallops, rubs or murmurs, bilateral 2+ pitting edema of the lower extremities  9. Gastrointestinal:  Positive bowel sounds, abdomen soft, non-tender to palpation,no hepatosplenomegaly, no rigidity or guarding       10. Skin:  No cyanosis, normal texture and turgor, no rash, lesions or ulcers  11.Musculoskeletal:  Good muscle tone,  joints appear normal , no effusions,  normal range of motion    Data Review:    CBC Recent Labs  Lab 04/04/17 1908  WBC 7.4  HGB 11.2*  HCT 35.8*  PLT 301  MCV 89.7  MCH 28.1  MCHC 31.3  RDW 14.5  LYMPHSABS 0.7  MONOABS 0.3  EOSABS 0.0  BASOSABS 0.0   ------------------------------------------------------------------------------------------------------------------  Chemistries  Recent Labs  Lab 04/04/17 1908  NA 135  K 4.4  CL 100*  CO2 25  GLUCOSE 106*  BUN 20  CREATININE 1.05  CALCIUM 8.9   ------------------------------------------------------------------------------------------------------------------  -------------------------------------------------------------------------------------------------Cardiac Enzymes: Recent Labs  Lab 04/04/17 1908  TROPONINI <0.03    --------------------------------------------------------------------------------------------------------------- Urine analysis:    Component Value Date/Time   COLORURINE YELLOW 05/10/2014 1436   APPEARANCEUR CLOUDY (A) 05/10/2014 1436   LABSPEC 1.025 05/10/2014 1436   PHURINE 6.5 05/10/2014 1436   GLUCOSEU NEGATIVE 05/10/2014 1436   HGBUR TRACE (A) 05/10/2014 1436   HGBUR negative 10/23/2007 1035   BILIRUBINUR NEGATIVE 05/10/2014 1436   KETONESUR NEGATIVE 05/10/2014 1436   PROTEINUR NEGATIVE 05/10/2014 1436   UROBILINOGEN 0.2 05/10/2014 1436   NITRITE NEGATIVE 05/10/2014 1436   LEUKOCYTESUR SMALL (A) 05/10/2014 1436      Imaging Results:    Dg Chest 2 View  Result Date: 04/04/2017 CLINICAL DATA:   Shortness of breath for 1 month. Lower extremity swelling. Asthma. EXAM: CHEST - 2 VIEW COMPARISON:  02/12/2017 and 12/27/2016 FINDINGS: Stable cardiomegaly. Aortic atherosclerosis. Stable postop changes from left thoracotomy. Extensive left lung volume loss and pleural-parenchymal scarring again seen. There is increased opacity in the posterior left retrocardiac lung base, which could be due to pulmonary consolidation or pleural effusion. Moderate upper thoracic levoscoliosis is also unchanged. IMPRESSION: Extensive postsurgical scarring in left hemithorax. Possible increased pleural effusion or consolidation in posterior left lung base. Electronically Signed   By: Earle Gell M.D.   On: 04/04/2017 19:46  Ct Chest W Contrast  Result Date: 04/04/2017 CLINICAL DATA:  Swelling in the legs and feet with aching for couple of days. Difficulty breathing. EXAM: CT CHEST WITH CONTRAST TECHNIQUE: Multidetector CT imaging of the chest was performed during intravenous contrast administration. CONTRAST:  72mL ISOVUE-300 IOPAMIDOL (ISOVUE-300) INJECTION 61% COMPARISON:  Chest radiograph 04/04/2017.  CT chest 07/02/2007 FINDINGS: Cardiovascular: Diffuse cardiac enlargement with four-chamber dilatation. Reflux of contrast material into the hepatic veins may indicate right heart failure. No pericardial effusion. No aortic aneurysm. No aortic dissection. Great vessel origins are patent. Visualized central pulmonary arteries are patent without evidence of pulmonary embolus. Hazy decreased opacity in the distal left pulmonary artery probably represents slow flow related to postoperative change. This appearance is similar to previous study. Mediastinum/Nodes: No significant lymphadenopathy. Esophagus is mostly decompressed. Lungs/Pleura: Postoperative left lower lobectomy. Prominent scarring in the remaining left upper lung. Appearance of the left lung is similar to previous study. Compensatory hyperinflation of the right lung.  Small right pleural effusion with atelectasis or infiltration in the right lung base. Mild interstitial septal prominence in the right lung may indicate interstitial edema. Right lung changes are new since previous study. Airways are patent. There is a nodular filling defect in the left mainstem bronchus measuring 8 mm. This is new since previous study and could represent a developing endobronchial lesion. Consider bronchoscopy if clinically indicated. Peribronchial thickening suggesting acute or chronic bronchitis. No pneumothorax. Upper Abdomen: Limited visualization. No acute abnormalities identified. Musculoskeletal: Postoperative thoracotomy changes in the left ribs. No destructive bone lesions. IMPRESSION: 1. Postoperative left lower lobectomy with prominent scarring and volume loss in the remaining left upper lung. Appearance is similar to previous study. 2. Small right pleural effusion with probable interstitial edema in the right lung. This is developing since the previous study. 3. Diffuse cardiac enlargement. Reflux of contrast material into the hepatic veins suggest right heart failure. 4. Airways are patent but diffuse bronchial wall thickening suggests acute or chronic bronchitis. There is a new 8 mm filling defect in the left mainstem bronchus which could indicate a recurrent endobronchial lesion. Consider bronchoscopy if clinically indicated. 5. Hazy decreased opacity in the distal left pulmonary artery probably represents slow flow related to postoperative change. Electronically Signed   By: Lucienne Capers M.D.   On: 04/04/2017 22:51    My personal review of EKG: Rhythm NSR   Assessment & Plan:    Active Problems:   GERD   BPH (benign prostatic hyperplasia)   Acute exacerbation of CHF (congestive heart failure) ([place])   1. Acute CHF exacerbation-combined systolic and diastolic, start Lasix 40 mg IV every 12 hours, start intake and output, daily weights, consult cardiology in a.m.  Will  repeat echocardiogram in a.m.  2. BPH-continue tamsulosin  3. History of COPD/asthma-continue albuterol as needed    DVT Prophylaxis-   Lovenox  AM Labs Ordered, also please review Full Orders  Family Communication: Admission, patients condition and plan of care including tests being ordered have been discussed with the patient  who indicate understanding and agree with the plan and Code Status.  Code Status: Full code  Admission status: Inpatient  Time spent in minutes : 60 minutes   Oswald Hillock M.D on 04/05/2017 at 1:02 AM  Between 7am to 7pm - Pager - [phone]. After 7pm go to [email or web address] - password [facility]  [facility] - Office  [phone]

## 2017-04-05 NOTE — Progress Notes (Signed)
*  PRELIMINARY RESULTS* Echocardiogram 2D Echocardiogram has been performed.  James Cervantes 04/05/2017, 9:35 AM

## 2017-04-06 LAB — HIV ANTIBODY (ROUTINE TESTING W REFLEX): HIV Screen 4th Generation wRfx: NONREACTIVE

## 2017-04-06 MED ORDER — METOPROLOL SUCCINATE ER 25 MG PO TB24: 12.5000 mg | ORAL_TABLET | Freq: Every day | ORAL | 12 refills | Status: DC

## 2017-04-06 MED ORDER — LOSARTAN POTASSIUM 25 MG PO TABS: 25.0000 mg | ORAL_TABLET | Freq: Every day | ORAL | 12 refills | Status: DC

## 2017-04-06 MED ORDER — POTASSIUM CHLORIDE ER 10 MEQ PO TBCR: 10.0000 meq | EXTENDED_RELEASE_TABLET | Freq: Every day | ORAL | 12 refills | Status: DC

## 2017-04-06 MED ORDER — FUROSEMIDE 40 MG PO TABS: 40.0000 mg | ORAL_TABLET | Freq: Every day | ORAL | 11 refills | Status: DC

## 2017-04-06 NOTE — Discharge Summary (Addendum)
Physician Discharge Summary  Patient ID: James Cervantes MRN: [id number] DOB/AGE: 1950/09/28 67 y.o. Primary Care Physician:Dondiego, Richard, MD Admit date: 04/04/2017 Discharge date: 04/06/2017    Discharge Diagnoses:  Acute on chronic combined systolic and diastolic heart failure Active Problems:   GERD   BPH (benign prostatic hyperplasia)   Acute exacerbation of CHF (congestive heart failure) (HCC) Hypertension COPD  Allergies as of 04/06/2017   No Known Allergies     Medication List    TAKE these medications   furosemide 40 MG tablet Commonly known as:  LASIX Take 1 tablet (40 mg total) by mouth daily.   losartan 25 MG tablet Commonly known as:  COZAAR Take 1 tablet (25 mg total) by mouth daily.   metoprolol succinate 25 MG 24 hr tablet Commonly known as:  TOPROL-XL Take 0.5 tablets (12.5 mg total) by mouth daily.   Oxycodone HCl 10 MG Tabs Take 5 mg by mouth at bedtime.   potassium chloride 10 MEQ tablet Commonly known as:  K-DUR Take 1 tablet (10 mEq total) by mouth daily.   PROAIR HFA 108 (90 Base) MCG/ACT inhaler Generic drug:  albuterol Inhale 2 puffs into the lungs every 6 (six) hours as needed for wheezing or shortness of breath.   tamsulosin 0.4 MG Caps capsule Commonly known as:  FLOMAX Take 0.4 mg by mouth at bedtime.       Discharged Condition: Improved    Consults: Cardiology  Significant Diagnostic Studies: Dg Chest 2 View  Result Date: 04/04/2017 CLINICAL DATA:  Shortness of breath for 1 month. Lower extremity swelling. Asthma. EXAM: CHEST - 2 VIEW COMPARISON:  02/12/2017 and 12/27/2016 FINDINGS: Stable cardiomegaly. Aortic atherosclerosis. Stable postop changes from left thoracotomy. Extensive left lung volume loss and pleural-parenchymal scarring again seen. There is increased opacity in the posterior left retrocardiac lung base, which could be due to pulmonary consolidation or pleural effusion. Moderate upper thoracic levoscoliosis is also  unchanged. IMPRESSION: Extensive postsurgical scarring in left hemithorax. Possible increased pleural effusion or consolidation in posterior left lung base. Electronically Signed   By: Earle Gell M.D.   On: 04/04/2017 19:46   Ct Chest W Contrast  Result Date: 04/04/2017 CLINICAL DATA:  Swelling in the legs and feet with aching for couple of days. Difficulty breathing. EXAM: CT CHEST WITH CONTRAST TECHNIQUE: Multidetector CT imaging of the chest was performed during intravenous contrast administration. CONTRAST:  36mL ISOVUE-300 IOPAMIDOL (ISOVUE-300) INJECTION 61% COMPARISON:  Chest radiograph 04/04/2017.  CT chest 07/02/2007 FINDINGS: Cardiovascular: Diffuse cardiac enlargement with four-chamber dilatation. Reflux of contrast material into the hepatic veins may indicate right heart failure. No pericardial effusion. No aortic aneurysm. No aortic dissection. Great vessel origins are patent. Visualized central pulmonary arteries are patent without evidence of pulmonary embolus. Hazy decreased opacity in the distal left pulmonary artery probably represents slow flow related to postoperative change. This appearance is similar to previous study. Mediastinum/Nodes: No significant lymphadenopathy. Esophagus is mostly decompressed. Lungs/Pleura: Postoperative left lower lobectomy. Prominent scarring in the remaining left upper lung. Appearance of the left lung is similar to previous study. Compensatory hyperinflation of the right lung. Small right pleural effusion with atelectasis or infiltration in the right lung base. Mild interstitial septal prominence in the right lung may indicate interstitial edema. Right lung changes are new since previous study. Airways are patent. There is a nodular filling defect in the left mainstem bronchus measuring 8 mm. This is new since previous study and could represent a developing endobronchial lesion. Consider bronchoscopy if  clinically indicated. Peribronchial thickening suggesting  acute or chronic bronchitis. No pneumothorax. Upper Abdomen: Limited visualization. No acute abnormalities identified. Musculoskeletal: Postoperative thoracotomy changes in the left ribs. No destructive bone lesions. IMPRESSION: 1. Postoperative left lower lobectomy with prominent scarring and volume loss in the remaining left upper lung. Appearance is similar to previous study. 2. Small right pleural effusion with probable interstitial edema in the right lung. This is developing since the previous study. 3. Diffuse cardiac enlargement. Reflux of contrast material into the hepatic veins suggest right heart failure. 4. Airways are patent but diffuse bronchial wall thickening suggests acute or chronic bronchitis. There is a new 8 mm filling defect in the left mainstem bronchus which could indicate a recurrent endobronchial lesion. Consider bronchoscopy if clinically indicated. 5. Hazy decreased opacity in the distal left pulmonary artery probably represents slow flow related to postoperative change. Electronically Signed   By: Lucienne Capers M.D.   On: 04/04/2017 22:51    Lab Results: Basic Metabolic Panel: Recent Labs    04/04/17 1908 04/05/17 0201  NA 135 145  K 4.4 3.9  CL 100* 101  CO2 25 32  GLUCOSE 106* 108*  BUN 20 20  CREATININE 1.05 1.14  CALCIUM 8.9 9.0   Liver Function Tests: Recent Labs    04/05/17 0201  AST 26  ALT 36  ALKPHOS 50  BILITOT 0.7  PROT 6.8  ALBUMIN 3.8     CBC: Recent Labs    04/04/17 1908 04/05/17 0201  WBC 7.4 8.2  NEUTROABS 6.4  --   HGB 11.2* 12.8*  HCT 35.8* 41.6  MCV 89.7 90.0  PLT 301 326    No results found for this or any previous visit (from the past 240 hour(s)).   Hospital Course: This is a 67 year old who came to the emergency department because of increasing problems with shortness of breath and swelling of his legs.  He is known to have congestive heart failure but has not had follow-up in about 5 years.  He also has COPD.  He  came to the emergency department he had significant edema bilaterally and his BNP was over 3000.  He was treated with diuretics and improved.  He had echocardiogram that showed his cardiac ejection fraction was around 38% with diastolic dysfunction as well.  On the morning of discharge he was up had disconnected himself from his monitor and IV and said he was going home.  I discharged him so that we can treat his heart failure rather than have him sign out AMA.  He is high risk for return.  I stressed the importance of follow-up to him but he does not seem interested  Discharge Exam: Blood pressure 134/82, pulse 83, temperature 97.9 F (36.6 C), temperature source Oral, resp. rate 19, height 5\' 6"  (1.676 m), weight 51.6 kg (113 lb 12.1 oz), SpO2 92 %. He is awake and alert.  His heart is regular.  His abdomen is soft.  His edema is substantially better but not gone.  Disposition: Home follow-up with cardiology if he will do that.  Discussed home health and he refuses      Signed: Sienna Stonehocker L   04/06/2017, 8:58 AM

## 2017-04-06 NOTE — Progress Notes (Signed)
Subjective: He says he feels better and he is going home.  His echocardiogram showed combined systolic and diastolic heart failure.  He has been started on ARB and beta-blocker.  He is better.  Objective: Vital signs in last 24 hours: Temp:  [97.9 F (36.6 C)-98 F (36.7 C)] 97.9 F (36.6 C) (04/06 0500) Pulse Rate:  [77-83] 83 (04/06 0500) Resp:  [19-20] 19 (04/06 0500) BP: (123-134)/(82-85) 134/82 (04/06 0500) SpO2:  [92 %-96 %] 92 % (04/06 0731) Weight:  [51.6 kg (113 lb 12.1 oz)] 51.6 kg (113 lb 12.1 oz) (04/06 0500) Weight change: -9.862 kg (-21 lb 11.9 oz) Last BM Date: 04/04/17  Intake/Output from previous day: 04/05 0701 - 04/06 0700 In: 723 [P.O.:720; I.V.:3] Out: 1250 [Urine:1250]  PHYSICAL EXAM General appearance: alert and no distress Resp: clear to auscultation bilaterally Cardio: regular rate and rhythm, S1, S2 normal, no murmur, click, rub or gallop GI: soft, non-tender; bowel sounds normal; no masses,  no organomegaly Extremities: His edema is substantially improved  Lab Results:  Results for orders placed or performed during the hospital encounter of 04/04/17 (from the past 48 hour(s))  Basic metabolic panel     Status: Abnormal   Collection Time: 04/04/17  7:08 PM  Result Value Ref Range   Sodium 135 135 - 145 mmol/L   Potassium 4.4 3.5 - 5.1 mmol/L   Chloride 100 (L) 101 - 111 mmol/L   CO2 25 22 - 32 mmol/L   Glucose, Bld 106 (H) 65 - 99 mg/dL   BUN 20 6 - 20 mg/dL   Creatinine, Ser 1.05 0.61 - 1.24 mg/dL   Calcium 8.9 8.9 - 10.3 mg/dL   GFR calc non Af Amer >60 >60 mL/min   GFR calc Af Amer >60 >60 mL/min    Comment: (NOTE) The eGFR has been calculated using the CKD EPI equation. This calculation has not been validated in all clinical situations. eGFR's persistently <60 mL/min signify possible Chronic Kidney Disease.    Anion gap 10 5 - 15    Comment: Performed at [facility], [address], [place], [place] [address]  CBC with Differential      Status: Abnormal   Collection Time: 04/04/17  7:08 PM  Result Value Ref Range   WBC 7.4 4.0 - 10.5 K/uL   RBC 3.99 (L) 4.22 - 5.81 MIL/uL   Hemoglobin 11.2 (L) 13.0 - 17.0 g/dL   HCT 35.8 (L) 39.0 - 52.0 %   MCV 89.7 78.0 - 100.0 fL   MCH 28.1 26.0 - 34.0 pg   MCHC 31.3 30.0 - 36.0 g/dL   RDW 14.5 11.5 - 15.5 %   Platelets 301 150 - 400 K/uL   Neutrophils Relative % 86 %   Neutro Abs 6.4 1.7 - 7.7 K/uL   Lymphocytes Relative 10 %   Lymphs Abs 0.7 0.7 - 4.0 K/uL   Monocytes Relative 4 %   Monocytes Absolute 0.3 0.1 - 1.0 K/uL   Eosinophils Relative 0 %   Eosinophils Absolute 0.0 0.0 - 0.7 K/uL   Basophils Relative 0 %   Basophils Absolute 0.0 0.0 - 0.1 K/uL    Comment: Performed at [facility], [address], [place], [place] [address]  Troponin I     Status: None   Collection Time: 04/04/17  7:08 PM  Result Value Ref Range   Troponin I <0.03 <0.03 ng/mL    Comment: Performed at [facility], [address], [place], [place] [address]  Brain natriuretic  peptide     Status: Abnormal   Collection Time: 04/04/17  7:09 PM  Result Value Ref Range   B Natriuretic Peptide 3,142.0 (H) 0.0 - 100.0 pg/mL    Comment: Performed at [facility], [address], [place], [place] [address]  HIV antibody (Routine Testing)     Status: None   Collection Time: 04/05/17  2:01 AM  Result Value Ref Range   HIV Screen 4th Generation wRfx Non Reactive Non Reactive    Comment: (NOTE) Performed At: [facility] [place], [place] [id number] Rush Farmer MD [id number] Performed at [facility], [address] [place], [place] [address]   CBC     Status: Abnormal   Collection Time: 04/05/17  2:01 AM  Result Value Ref Range   WBC 8.2 4.0 - 10.5 K/uL   RBC 4.62 4.22 - 5.81 MIL/uL   Hemoglobin 12.8 (L) 13.0 - 17.0 g/dL   HCT 41.6 39.0 - 52.0 %   MCV 90.0 78.0 - 100.0 fL   MCH 27.7 26.0 - 34.0 pg   MCHC 30.8 30.0 - 36.0 g/dL   RDW 14.5 11.5 - 15.5 %   Platelets 326 150  - 400 K/uL    Comment: Performed at [facility], [address] [place], [place] [address]  Comprehensive metabolic panel     Status: Abnormal   Collection Time: 04/05/17  2:01 AM  Result Value Ref Range   Sodium 145 135 - 145 mmol/L    Comment: DELTA CHECK NOTED   Potassium 3.9 3.5 - 5.1 mmol/L   Chloride 101 101 - 111 mmol/L   CO2 32 22 - 32 mmol/L   Glucose, Bld 108 (H) 65 - 99 mg/dL   BUN 20 6 - 20 mg/dL   Creatinine, Ser 1.14 0.61 - 1.24 mg/dL   Calcium 9.0 8.9 - 10.3 mg/dL   Total Protein 6.8 6.5 - 8.1 g/dL   Albumin 3.8 3.5 - 5.0 g/dL   AST 26 15 - 41 U/L   ALT 36 17 - 63 U/L   Alkaline Phosphatase 50 38 - 126 U/L   Total Bilirubin 0.7 0.3 - 1.2 mg/dL   GFR calc non Af Amer >60 >60 mL/min   GFR calc Af Amer >60 >60 mL/min    Comment: (NOTE) The eGFR has been calculated using the CKD EPI equation. This calculation has not been validated in all clinical situations. eGFR's persistently <60 mL/min signify possible Chronic Kidney Disease.    Anion gap 12 5 - 15    Comment: Performed at [facility], [address], [place], [place] [address]  Troponin I (q 6hr x 3)     Status: None   Collection Time: 04/05/17  2:01 AM  Result Value Ref Range   Troponin I <0.03 <0.03 ng/mL    Comment: Performed at [facility], [address] [place], [place] [address]  Troponin I (q 6hr x 3)     Status: Abnormal   Collection Time: 04/05/17  8:02 AM  Result Value Ref Range   Troponin I 0.03 (HH) <0.03 ng/mL    Comment: CRITICAL RESULT CALLED TO, READ BACK BY AND VERIFIED WITH: MORRIS,C AT 8:50AM ON 04/05/17 BY [facility] Performed at [facility], [address] [place],  [address]     ABGS No results for input(s): PHART, PO2ART, TCO2, HCO3 in the last 72 hours.  Invalid input(s): PCO2 CULTURES No results found for this or any previous visit (from the past 240 hour(s)). Studies/Results:  Dg Chest 2 View  Result Date: 04/04/2017 CLINICAL DATA:  Shortness of breath for 1  month. Lower extremity swelling. Asthma. EXAM: CHEST - 2 VIEW COMPARISON:  02/12/2017 and 12/27/2016 FINDINGS: Stable cardiomegaly. Aortic atherosclerosis. Stable postop changes from left thoracotomy. Extensive left lung volume loss and pleural-parenchymal scarring again seen. There is increased opacity in the posterior left retrocardiac lung base, which could be due to pulmonary consolidation or pleural effusion. Moderate upper thoracic levoscoliosis is also unchanged. IMPRESSION: Extensive postsurgical scarring in left hemithorax. Possible increased pleural effusion or consolidation in posterior left lung base. Electronically Signed   By: Earle Gell M.D.   On: 04/04/2017 19:46   Ct Chest W Contrast  Result Date: 04/04/2017 CLINICAL DATA:  Swelling in the legs and feet with aching for couple of days. Difficulty breathing. EXAM: CT CHEST WITH CONTRAST TECHNIQUE: Multidetector CT imaging of the chest was performed during intravenous contrast administration. CONTRAST:  36m ISOVUE-300 IOPAMIDOL (ISOVUE-300) INJECTION 61% COMPARISON:  Chest radiograph 04/04/2017.  CT chest 07/02/2007 FINDINGS: Cardiovascular: Diffuse cardiac enlargement with four-chamber dilatation. Reflux of contrast material into the hepatic veins may indicate right heart failure. No pericardial effusion. No aortic aneurysm. No aortic dissection. Great vessel origins are patent. Visualized central pulmonary arteries are patent without evidence of pulmonary embolus. Hazy decreased opacity in the distal left pulmonary artery probably represents slow flow related to postoperative change. This appearance is similar to previous study. Mediastinum/Nodes: No significant lymphadenopathy. Esophagus is mostly decompressed. Lungs/Pleura: Postoperative left lower lobectomy. Prominent scarring in the remaining left upper lung. Appearance of the left lung is similar to previous study. Compensatory hyperinflation of the right lung. Small right pleural effusion  with atelectasis or infiltration in the right lung base. Mild interstitial septal prominence in the right lung may indicate interstitial edema. Right lung changes are new since previous study. Airways are patent. There is a nodular filling defect in the left mainstem bronchus measuring 8 mm. This is new since previous study and could represent a developing endobronchial lesion. Consider bronchoscopy if clinically indicated. Peribronchial thickening suggesting acute or chronic bronchitis. No pneumothorax. Upper Abdomen: Limited visualization. No acute abnormalities identified. Musculoskeletal: Postoperative thoracotomy changes in the left ribs. No destructive bone lesions. IMPRESSION: 1. Postoperative left lower lobectomy with prominent scarring and volume loss in the remaining left upper lung. Appearance is similar to previous study. 2. Small right pleural effusion with probable interstitial edema in the right lung. This is developing since the previous study. 3. Diffuse cardiac enlargement. Reflux of contrast material into the hepatic veins suggest right heart failure. 4. Airways are patent but diffuse bronchial wall thickening suggests acute or chronic bronchitis. There is a new 8 mm filling defect in the left mainstem bronchus which could indicate a recurrent endobronchial lesion. Consider bronchoscopy if clinically indicated. 5. Hazy decreased opacity in the distal left pulmonary artery probably represents slow flow related to postoperative change. Electronically Signed   By: WLucienne CapersM.D.   On: 04/04/2017 22:51    Medications:  Prior to Admission:  Medications Prior to Admission  Medication Sig Dispense Refill Last Dose  . albuterol (PROAIR HFA) 108 (90 BASE) MCG/ACT inhaler Inhale 2 puffs into the lungs every 6 (six) hours as needed for wheezing or shortness of breath.   04/04/2017 at Unknown time  . tamsulosin (FLOMAX) 0.4 MG CAPS capsule Take 0.4 mg by mouth at bedtime.    04/03/2017 at Unknown  time  . Oxycodone HCl 10 MG TABS Take 5 mg by mouth  at bedtime.    unknown   Scheduled: . enoxaparin (LOVENOX) injection  40 mg Subcutaneous Q24H  . furosemide  40 mg Intravenous Q12H  . losartan  25 mg Oral Daily  . metoprolol succinate  12.5 mg Oral Daily  . oxyCODONE  5 mg Oral QHS  . sodium chloride flush  3 mL Intravenous Q12H  . tamsulosin  0.4 mg Oral QHS   Continuous: . sodium chloride     KOR:JGYLUD chloride, albuterol, ondansetron **OR** ondansetron (ZOFRAN) IV, sodium chloride flush  Assesment: He has acute on chronic combined systolic and diastolic heart failure.  He says he is going to go home.  I doubt he is going to have meaningful follow-up. He has BPH which is stable Active Problems:   GERD   BPH (benign prostatic hyperplasia)   Acute exacerbation of CHF (congestive heart failure) ([place])    Plan: Rather than have him sign out AGAINST MEDICAL ADVICE I am going to go ahead and discharge him so that he can get his medications that are needed    LOS: 1 day   Marquerite Forsman L 04/06/2017, 8:55 AM

## 2017-04-06 NOTE — Progress Notes (Addendum)
Pt removed IV-clean, dry, intact. Reviewed d/c paperwork with pt. Reviewed new medications and where to pick up. Answered all questions. Walked stable pt and belongings to emergency entrance.

## 2017-04-06 NOTE — Plan of Care (Signed)
progressing 

## 2017-05-21 ENCOUNTER — Ambulatory Visit: Payer: Medicare Other | Admitting: Urology

## 2017-07-30 ENCOUNTER — Ambulatory Visit (INDEPENDENT_AMBULATORY_CARE_PROVIDER_SITE_OTHER): Payer: Medicare Other | Admitting: Urology

## 2017-07-30 DIAGNOSIS — R350 Frequency of micturition: Secondary | ICD-10-CM

## 2017-07-30 DIAGNOSIS — N401 Enlarged prostate with lower urinary tract symptoms: Secondary | ICD-10-CM

## 2017-07-30 DIAGNOSIS — N5201 Erectile dysfunction due to arterial insufficiency: Secondary | ICD-10-CM | POA: Diagnosis not present

## 2017-09-05 ENCOUNTER — Encounter (HOSPITAL_COMMUNITY): Payer: Self-pay

## 2017-09-05 ENCOUNTER — Other Ambulatory Visit: Payer: Self-pay

## 2017-09-05 ENCOUNTER — Emergency Department (HOSPITAL_COMMUNITY)
Admission: EM | Admit: 2017-09-05 | Discharge: 2017-09-05 | Disposition: A | Discharge status: Home / Self Care | Payer: Medicare Other | Attending: Emergency Medicine | Admitting: Emergency Medicine

## 2017-09-05 DIAGNOSIS — F17210 Nicotine dependence, cigarettes, uncomplicated: Secondary | ICD-10-CM | POA: Insufficient documentation

## 2017-09-05 DIAGNOSIS — J45909 Unspecified asthma, uncomplicated: Secondary | ICD-10-CM | POA: Insufficient documentation

## 2017-09-05 DIAGNOSIS — L729 Follicular cyst of the skin and subcutaneous tissue, unspecified: Secondary | ICD-10-CM | POA: Diagnosis present

## 2017-09-05 DIAGNOSIS — M1711 Unilateral primary osteoarthritis, right knee: Secondary | ICD-10-CM | POA: Insufficient documentation

## 2017-09-05 DIAGNOSIS — Z79899 Other long term (current) drug therapy: Secondary | ICD-10-CM | POA: Diagnosis not present

## 2017-09-05 MED ORDER — ACETAMINOPHEN 500 MG PO TABS
1000.0000 mg | ORAL_TABLET | Freq: Once | ORAL | Status: AC
  Administered 2017-09-05: 1000 mg via ORAL
  Filled 2017-09-05: qty 2

## 2017-09-05 MED ORDER — TRAMADOL HCL 50 MG PO TABS: 50.0000 mg | ORAL_TABLET | Freq: Four times a day (QID) | ORAL | 0 refills | Status: DC | PRN

## 2017-09-05 MED ORDER — PREDNISONE 20 MG PO TABS
40.0000 mg | ORAL_TABLET | Freq: Once | ORAL | Status: AC
  Administered 2017-09-05: 40 mg via ORAL
  Filled 2017-09-05: qty 2

## 2017-09-05 MED ORDER — DEXAMETHASONE 4 MG PO TABS: 4.0000 mg | ORAL_TABLET | Freq: Two times a day (BID) | ORAL | 0 refills | Status: DC

## 2017-09-05 MED ORDER — IBUPROFEN 400 MG PO TABS
400.0000 mg | ORAL_TABLET | Freq: Once | ORAL | Status: AC
  Administered 2017-09-05: 400 mg via ORAL
  Filled 2017-09-05: qty 1

## 2017-09-05 NOTE — ED Provider Notes (Signed)
[facility] EMERGENCY DEPARTMENT Provider Note   CSN: [id number] Arrival date & time: 09/05/17  2102     History   Chief Complaint Chief Complaint  Patient presents with  . Cyst    HPI James Cervantes is a 67 y.o. male.  Patient is a 67 year old male who presents to the emergency department with a complaint of a growth on his knee, and knee pain.  Patient states he has had a knot on the inside of his right knee for 2 to 3 months.  He says now it is beginning to bother him.  He states however he has a history of arthritis in his knees bother him from time to time, but now it seems to be worse.  He is able to stand, and he is able to walk but becoming more more difficult.  The knot is not hot.  It is painful if it is pressed on.  He has not had much of a problem with swelling of the right knee.  He has not had any new injury to this area.  He presents now to have it evaluated.  The history is provided by the patient.    Past Medical History:  Diagnosis Date  . Anemia   . Arthritis   . Asthma   . Cardiomyopathy ([place])    a. 2014: EF 40-45%, diffuse HK, and Grade 2 DD  . Dyspnea   . Emphysema   . GERD (gastroesophageal reflux disease)   . Hip pain   . Peripheral vascular disease ([place])   . Prostate disorder     Patient Active Problem List   Diagnosis Date Noted  . Acute exacerbation of CHF (congestive heart failure) ([place]) 04/05/2017  . 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 Atypical chest pain 10/13/2012  . Chest pain 10/12/2012  . Abnormal EKG 10/12/2012  . GERD (gastroesophageal reflux disease) 10/12/2012  . UNSPECIFIED ANEMIA 05/18/2008  . ERECTILE DYSFUNCTION 09/25/2007  . UNS PULMONARY TB-CULT DX 06/19/2007  . UNDERWEIGHT 06/19/2007  . OSTEOARTHRITIS, HIP, RIGHT 02/18/2006  . HEPATITIS C 01/08/2006  . DISORDER, TOBACCO USE 01/08/2006  . MIGRAINE HEADACHE 01/08/2006  . ALLERGIC RHINITIS 01/08/2006  . GERD 01/08/2006  . BPH (benign prostatic hyperplasia)  01/08/2006  . OSTEOARTHRITIS 01/08/2006    Past Surgical History:  Procedure Laterality Date  . ABDOMINAL SURGERY    . CATARACT EXTRACTION W/PHACO Left 11/26/2016   Procedure: CATARACT EXTRACTION PHACO AND INTRAOCULAR LENS PLACEMENT LEFT EYE;  Surgeon: Tonny Branch, MD;  Location: [facility];  Service: Ophthalmology;  Laterality: Left;  CDE: 26.53   . CATARACT EXTRACTION W/PHACO Right 01/28/2017   Procedure: CATARACT EXTRACTION PHACO AND INTRAOCULAR LENS PLACEMENT RIGHT EYE;  Surgeon: Tonny Branch, MD;  Location: [facility];  Service: Ophthalmology;  Laterality: Right;  CDE: 6.79  . HERNIA REPAIR    . LUNG SURGERY    . VASCULAR SURGERY          Home Medications    Prior to Admission medications   Medication Sig Start Date End Date Taking? Authorizing Provider  albuterol (PROAIR HFA) 108 (90 BASE) MCG/ACT inhaler Inhale 2 puffs into the lungs every 6 (six) hours as needed for wheezing or shortness of breath.    [provider]  furosemide (LASIX) 40 MG tablet Take 1 tablet (40 mg total) by mouth daily. 04/06/17 04/06/18  Sinda Du, MD  losartan (COZAAR) 25 MG tablet Take 1 tablet (25 mg total) by mouth daily. 04/06/17   Sinda Du, MD  metoprolol  succinate (TOPROL-XL) 25 MG 24 hr tablet Take 0.5 tablets (12.5 mg total) by mouth daily. 04/06/17   Sinda Du, MD  Oxycodone HCl 10 MG TABS Take 5 mg by mouth at bedtime.     [provider]  potassium chloride (K-DUR) 10 MEQ tablet Take 1 tablet (10 mEq total) by mouth daily. 04/06/17   Sinda Du, MD  tamsulosin (FLOMAX) 0.4 MG CAPS capsule Take 0.4 mg by mouth at bedtime.     [provider]    Family History Family History  Problem Relation Age of Onset  . Cancer Mother   . Hypertension Mother     Social History Social History   Tobacco Use  . Smoking status: Current Every Day Smoker    Packs/day: 0.50    Years: 50.00    Pack years: 25.00  . Smokeless tobacco: Never Used  Substance Use Topics    . Alcohol use: No  . Drug use: No     Allergies   Patient has no known allergies.   Review of Systems Review of Systems  Constitutional: Negative for activity change.       All ROS Neg except as noted in HPI  HENT: Negative for nosebleeds.   Eyes: Negative for photophobia and discharge.  Respiratory: Negative for cough, shortness of breath and wheezing.   Cardiovascular: Negative for chest pain and palpitations.  Gastrointestinal: Negative for abdominal pain and blood in stool.  Genitourinary: Negative for dysuria, frequency and hematuria.  Musculoskeletal: Positive for arthralgias. Negative for back pain and neck pain.  Skin: Negative.   Neurological: Negative for dizziness, seizures and speech difficulty.  Psychiatric/Behavioral: Negative for confusion and hallucinations.     Physical Exam Updated Vital Signs BP 137/74 (BP Location: Right Arm)   Pulse 78   Temp 97.6 F (36.4 C) (Oral)   Resp 16   Ht 5\' 6"  (1.676 m)   Wt 52.2 kg   SpO2 100%   BMI 18.56 kg/m   Physical Exam  Constitutional: He is oriented to person, place, and time. He appears well-developed and well-nourished.  Non-toxic appearance.  HENT:  Head: Normocephalic.  Right Ear: Tympanic membrane and external ear normal.  Left Ear: Tympanic membrane and external ear normal.  Eyes: Pupils are equal, round, and reactive to light. EOM and lids are normal.  Neck: Normal range of motion. Neck supple. Carotid bruit is not present.  Cardiovascular: Normal rate, regular rhythm, normal heart sounds, intact distal pulses and normal pulses.  Pulmonary/Chest: No respiratory distress.  Soft wheezes few scattered rhonchi present.  Abdominal: Soft. Bowel sounds are normal. There is no tenderness. There is no guarding.  Musculoskeletal: Normal range of motion.  There is a marble sized cyst on the medial aspect of the right knee.  It is freely mobile.  It is not hot to touch.  It is tender if pressed.  There are no  posterior knee mass appreciated.  There is crepitus with flexion and extension of the knee.  There is no effusion present.  Lymphadenopathy:       Head (right side): No submandibular adenopathy present.       Head (left side): No submandibular adenopathy present.    He has no cervical adenopathy.  Neurological: He is alert and oriented to person, place, and time. He has normal strength. No cranial nerve deficit or sensory deficit.  Skin: Skin is warm and dry.  Psychiatric: He has a normal mood and affect. His speech is normal.  Nursing  note and vitals reviewed.    ED Treatments / Results  Labs (all labs ordered are listed, but only abnormal results are displayed) Labs Reviewed - No data to display  EKG None  Radiology No results found.  Procedures Procedures (including critical care time)  Medications Ordered in ED Medications  predniSONE (DELTASONE) tablet 40 mg (has no administration in time range)  ibuprofen (ADVIL,MOTRIN) tablet 400 mg (has no administration in time range)  acetaminophen (TYLENOL) tablet 1,000 mg (has no administration in time range)     Initial Impression / Assessment and Plan / ED Course  I have reviewed the triage vital signs and the nursing notes.  Pertinent labs & imaging results that were available during my care of the patient were reviewed by me and considered in my medical decision making (see chart for details).       Final Clinical Impressions(s) / ED Diagnoses MDM  Vital signs reviewed.  Pulse oximetry is 100% on room air.  The patient has a cyst of this skin on the medial aspect of the right knee.  There is no effusion present.  There are no neurovascular deficits appreciated.  I explained to the patient that he will need to see general surgery concerning this.  I have offered the patient some medication to use for his arthritis related pain of the right knee.  Patient is in agreement with this plan.   Final diagnoses:  Cyst of  skin  Primary osteoarthritis of right knee    ED Discharge Orders         Ordered    traMADol (ULTRAM) 50 MG tablet  Every 6 hours PRN     09/05/17 2308    dexamethasone (DECADRON) 4 MG tablet  2 times daily with meals     09/05/17 2308           Lily Kocher, PA-C 09/06/17 1459    Noemi Chapel, MD 09/06/17 [phone]

## 2017-09-05 NOTE — Discharge Instructions (Addendum)
Use Decadron 2 times daily with food.  Use Tylenol extra strength every 4 hours as needed for mild to moderate pain.  Use Ultram for more severe pain.  Ultram may cause drowsiness.  Please do not drive a vehicle, operate machinery, or participate in activities requiring concentration when taking this medication.  Please see Dr. Arnoldo Morale for surgical evaluation of the cyst on the side of your knee.  Discuss your increasing arthritis and knee pain with Dr. Cindie Laroche.

## 2017-09-05 NOTE — ED Triage Notes (Signed)
Pt states 'knot' on medial inside of right knee for the last 'couple months', it has started to ache and makes it difficult for him to walk.

## 2017-09-26 ENCOUNTER — Ambulatory Visit (INDEPENDENT_AMBULATORY_CARE_PROVIDER_SITE_OTHER): Payer: Medicare Other | Admitting: General Surgery

## 2017-09-26 ENCOUNTER — Encounter: Payer: Self-pay | Admitting: General Surgery

## 2017-09-26 VITALS — BP 141/68 | HR 67 | Temp 98.6°F | Resp 18 | Ht 66.0 in | Wt 115.0 lb

## 2017-09-26 DIAGNOSIS — L723 Sebaceous cyst: Secondary | ICD-10-CM

## 2017-09-26 NOTE — Progress Notes (Signed)
James Cervantes; [id number]; 1950-01-18   HPI Patient is a 67 year old black male who was referred to my care by Dr. Frances Furbish for evaluation treatment of a tender knot along the inner aspect of the right knee.  Patient states is been present for several months, but is increasing in size and causing him pain.  He states his pain is 10 out of 10 when pressure is applied on it.  It is not affected with movement.  He denies any knee swelling.  He denies any fever or drainage.  He states he does have a history of arthritis. Past Medical History:  Diagnosis Date  . Anemia   . Arthritis   . Asthma   . Cardiomyopathy ([place])    a. 2014: EF 40-45%, diffuse HK, and Grade 2 DD  . Dyspnea   . Emphysema   . GERD (gastroesophageal reflux disease)   . Hip pain   . Peripheral vascular disease ([place])   . Prostate disorder     Past Surgical History:  Procedure Laterality Date  . ABDOMINAL SURGERY    . CATARACT EXTRACTION W/PHACO Left 11/26/2016   Procedure: CATARACT EXTRACTION PHACO AND INTRAOCULAR LENS PLACEMENT LEFT EYE;  Surgeon: Tonny Branch, MD;  Location: [facility];  Service: Ophthalmology;  Laterality: Left;  CDE: 26.53   . CATARACT EXTRACTION W/PHACO Right 01/28/2017   Procedure: CATARACT EXTRACTION PHACO AND INTRAOCULAR LENS PLACEMENT RIGHT EYE;  Surgeon: Tonny Branch, MD;  Location: [facility];  Service: Ophthalmology;  Laterality: Right;  CDE: 6.79  . HERNIA REPAIR    . LUNG SURGERY    . VASCULAR SURGERY      Family History  Problem Relation Age of Onset  . Cancer Mother   . Hypertension Mother     Current Outpatient Medications on File Prior to Visit  Medication Sig Dispense Refill  . albuterol (PROAIR HFA) 108 (90 BASE) MCG/ACT inhaler Inhale 2 puffs into the lungs every 6 (six) hours as needed for wheezing or shortness of breath.    . dexamethasone (DECADRON) 4 MG tablet Take 1 tablet (4 mg total) by mouth 2 (two) times daily with a meal. 10 tablet 0  . furosemide (LASIX) 40 MG tablet Take 1  tablet (40 mg total) by mouth daily. 30 tablet 11  . losartan (COZAAR) 25 MG tablet Take 1 tablet (25 mg total) by mouth daily. 30 tablet 12  . metoprolol succinate (TOPROL-XL) 25 MG 24 hr tablet Take 0.5 tablets (12.5 mg total) by mouth daily. 30 tablet 12  . Oxycodone HCl 10 MG TABS Take 5 mg by mouth at bedtime.     . potassium chloride (K-DUR) 10 MEQ tablet Take 1 tablet (10 mEq total) by mouth daily. 30 tablet 12  . tamsulosin (FLOMAX) 0.4 MG CAPS capsule Take 0.4 mg by mouth at bedtime.     . traMADol (ULTRAM) 50 MG tablet Take 1 tablet (50 mg total) by mouth every 6 (six) hours as needed. 12 tablet 0   No current facility-administered medications on file prior to visit.     No Known Allergies  Social History   Substance and Sexual Activity  Alcohol Use No    Social History   Tobacco Use  Smoking Status Current Every Day Smoker  . Packs/day: 0.50  . Years: 50.00  . Pack years: 25.00  Smokeless Tobacco Never Used    Review of Systems  Constitutional: Negative.   HENT: Negative.   Eyes: Negative.   Respiratory: Positive for cough, shortness of  breath and wheezing.   Cardiovascular: Negative.   Gastrointestinal: Negative.   Genitourinary: Negative.   Musculoskeletal: Positive for joint pain.  Skin: Negative.   Neurological: Negative.   Endo/Heme/Allergies: Negative.   Psychiatric/Behavioral: Negative.     Objective   Vitals:   09/26/17 0902  BP: (!) 141/68  Pulse: 67  Resp: 18  Temp: 98.6 F (37 C)    Physical Exam  Constitutional: He is oriented to person, place, and time. He appears well-developed and well-nourished. No distress.  HENT:  Head: Normocephalic and atraumatic.  Cardiovascular: Normal rate, regular rhythm and normal heart sounds. Exam reveals no gallop.  No murmur heard. Pulmonary/Chest: Effort normal. No stridor. No respiratory distress. He has wheezes. He has no rales.  Neurological: He is alert and oriented to person, place, and time.   Skin: Skin is warm and dry.  1.5 cm ovoid, rubbery, mobile subcutaneous nodule along the medial aspect of the right knee.  It is not in continuity with the bone or the capsule.  Tender to touch.  Vitals reviewed. Dr. Cindie Laroche notes reviewed  Assessment  Cyst, right knee Plan   Patient is scheduled for excision of the cyst, right knee on 10/04/2017.  The risks and benefits of the procedure including bleeding, infection, and recurrence of the cyst were fully explained to the patient, who gave informed consent.

## 2017-09-26 NOTE — Patient Instructions (Signed)
James Cervantes  09/26/2017     @PREFPERIOPPHARMACY @   Your procedure is scheduled on  10/04/2017 .  Report to [facility] at  615  A.M.  Call this number if you have problems the morning of surgery:  [phone]   Remember:  Do not eat or drink after midnight.  You may drink clear liquids until  12 midnight 10/03/2017 .  Clear liquids allowed are:                    Water, Juice (non-citric and without pulp), Carbonated beverages, Clear Tea, Black Coffee only, Plain Jell-O only, Gatorade and Plain Popsicles only    Take these medicines the morning of surgery with A SIP OF WATER  Decadron, losartan, metoprolol, oxycodone, ultram Use your inhaler before you come.    Do not wear jewelry, make-up or nail polish.  Do not wear lotions, powders, or perfumes, or deodorant.  Do not shave 48 hours prior to surgery.  Men may shave face and neck.  Do not bring valuables to the hospital.  [facility] is not responsible for any belongings or valuables.  Contacts, dentures or bridgework may not be worn into surgery.  Leave your suitcase in the car.  After surgery it may be brought to your room.  For patients admitted to the hospital, discharge time will be determined by your treatment team.  Patients discharged the day of surgery will not be allowed to drive home.   Name and phone number of your driver:   family Special instructions:  None  Please read over the following fact sheets that you were given. Anesthesia Post-op Instructions and Care and Recovery After Surgery      Epidermal Cyst Removal, Care After Refer to this sheet in the next few weeks. These instructions provide you with information about caring for yourself after your procedure. Your health care provider may also give you more specific instructions. Your treatment has been planned according to current medical practices, but problems sometimes occur. Call your health care provider if you have any problems or  questions after your procedure. What can I expect after the procedure? After the procedure, it is common to have:  Soreness in the area where your cyst was removed.  Tightness or itching from your skin sutures.  Follow these instructions at home:  Take medicines only as directed by your health care provider.  If you were prescribed an antibiotic medicine, finish all of it even if you start to feel better.  Use antibiotic ointment as directed by your health care provider. Follow the instructions carefully.  There are many different ways to close and cover an incision, including stitches (sutures), skin glue, and adhesive strips. Follow your health care provider's instructions about: ? Incision care. ? Bandage (dressing) changes and removal. ? Incision closure removal.  Keep the bandage (dressing) dry until your health care provider says that it can be removed. Take sponge baths only. Ask your health care provider when you can start showering or taking a bath.  After your dressing is off, check your incision every day for signs of infection. Watch for: ? Redness, swelling, or pain. ? Fluid, blood, or pus.  You can return to your normal activities. Do not do anything that stretches or puts pressure on your incision.  You can return to your normal diet.  Keep all follow-up visits as directed by your health care provider. This  is important. Contact a health care provider if:  You have a fever.  Your incision bleeds.  You have redness, swelling, or pain in the incision area.  You have fluid, blood, or pus coming from your incision.  Your cyst comes back after surgery. This information is not intended to replace advice given to you by your health care provider. Make sure you discuss any questions you have with your health care provider. Document Released: 01/08/2014 Document Revised: 05/26/2015 Document Reviewed: 09/02/2013 Elsevier Interactive Patient Education  2018 [place].  Epidermal Cyst An epidermal cyst is a small, painless lump under your skin. It may be called an epidermal inclusion cyst or an infundibular cyst. The cyst contains a grayish-white, bad-smelling substance (keratin). It is important not to pop epidermal cysts yourself. These cysts are usually harmless (benign), but they can get infected. Symptoms of infection may include:  Redness.  Inflammation.  Tenderness.  Warmth.  Fever.  A grayish-white, bad-smelling substance draining from the cyst.  Pus draining from the cyst.  Follow these instructions at home:  Take over-the-counter and prescription medicines only as told by your doctor.  If you were prescribed an antibiotic, use it as told by your doctor. Do not stop using the antibiotic even if you start to feel better.  Keep the area around your cyst clean and dry.  Wear loose, dry clothing.  Do not try to pop your cyst.  Avoid touching your cyst.  Check your cyst every day for signs of infection.  Keep all follow-up visits as told by your doctor. This is important. How is this prevented?  Wear clean, dry, clothing.  Avoid wearing tight clothing.  Keep your skin clean and dry. Shower or take baths every day.  Wash your body with a benzoyl peroxide wash when you shower or bathe. Contact a health care provider if:  Your cyst has symptoms of infection.  Your condition is not improving or is getting worse.  You have a cyst that looks different from other cysts you have had.  You have a fever. Get help right away if:  Redness spreads from the cyst into the surrounding area. This information is not intended to replace advice given to you by your health care provider. Make sure you discuss any questions you have with your health care provider. Document Released: 01/26/2004 Document Revised: 08/17/2015 Document Reviewed: 10/20/2014 Elsevier Interactive Patient Education  2018 [place] Anesthesia is a term that refers to techniques, procedures, and medicines that help a person stay safe and comfortable during a medical procedure. Monitored anesthesia care, or sedation, is one type of anesthesia. Your anesthesia specialist may recommend sedation if you will be having a procedure that does not require you to be unconscious, such as:  Cataract surgery.  A dental procedure.  A biopsy.  A colonoscopy.  During the procedure, you may receive a medicine to help you relax (sedative). There are three levels of sedation:  Mild sedation. At this level, you may feel awake and relaxed. You will be able to follow directions.  Moderate sedation. At this level, you will be sleepy. You may not remember the procedure.  Deep sedation. At this level, you will be asleep. You will not remember the procedure.  The more medicine you are given, the deeper your level of sedation will be. Depending on how you respond to the procedure, the anesthesia specialist may change your level of sedation or the type of anesthesia to  fit your needs. An anesthesia specialist will monitor you closely during the procedure. Let your health care provider know about:  Any allergies you have.  All medicines you are taking, including vitamins, herbs, eye drops, creams, and over-the-counter medicines.  Any use of steroids (by mouth or as a cream).  Any problems you or family members have had with sedatives and anesthetic medicines.  Any blood disorders you have.  Any surgeries you have had.  Any medical conditions you have, such as sleep apnea.  Whether you are pregnant or may be pregnant.  Any use of cigarettes, alcohol, or street drugs. What are the risks? Generally, this is a safe procedure. However, problems may occur, including:  Getting too much medicine (oversedation).  Nausea.  Allergic reaction to medicines.  Trouble breathing. If this happens, a breathing tube may be used to help  with breathing. It will be removed when you are awake and breathing on your own.  Heart trouble.  Lung trouble.  Before the procedure Staying hydrated Follow instructions from your health care provider about hydration, which may include:  Up to 2 hours before the procedure - you may continue to drink clear liquids, such as water, clear fruit juice, black coffee, and plain tea.  Eating and drinking restrictions Follow instructions from your health care provider about eating and drinking, which may include:  8 hours before the procedure - stop eating heavy meals or foods such as meat, fried foods, or fatty foods.  6 hours before the procedure - stop eating light meals or foods, such as toast or cereal.  6 hours before the procedure - stop drinking milk or drinks that contain milk.  2 hours before the procedure - stop drinking clear liquids.  Medicines Ask your health care provider about:  Changing or stopping your regular medicines. This is especially important if you are taking diabetes medicines or blood thinners.  Taking medicines such as aspirin and ibuprofen. These medicines can thin your blood. Do not take these medicines before your procedure if your health care provider instructs you not to.  Tests and exams  You will have a physical exam.  You may have blood tests done to show: ? How well your kidneys and liver are working. ? How well your blood can clot.  General instructions  Plan to have someone take you home from the hospital or clinic.  If you will be going home right after the procedure, plan to have someone with you for 24 hours.  What happens during the procedure?  Your blood pressure, heart rate, breathing, level of pain and overall condition will be monitored.  An IV tube will be inserted into one of your veins.  Your anesthesia specialist will give you medicines as needed to keep you comfortable during the procedure. This may mean changing the level  of sedation.  The procedure will be performed. After the procedure  Your blood pressure, heart rate, breathing rate, and blood oxygen level will be monitored until the medicines you were given have worn off.  Do not drive for 24 hours if you received a sedative.  You may: ? Feel sleepy, clumsy, or nauseous. ? Feel forgetful about what happened after the procedure. ? Have a sore throat if you had a breathing tube during the procedure. ? Vomit. This information is not intended to replace advice given to you by your health care provider. Make sure you discuss any questions you have with your health care provider. Document Released: 09/13/2004  Document Revised: 05/27/2015 Document Reviewed: 04/10/2015 Elsevier Interactive Patient Education  2018 [place], Care After These instructions provide you with information about caring for yourself after your procedure. Your health care provider may also give you more specific instructions. Your treatment has been planned according to current medical practices, but problems sometimes occur. Call your health care provider if you have any problems or questions after your procedure. What can I expect after the procedure? After your procedure, it is common to:  Feel sleepy for several hours.  Feel clumsy and have poor balance for several hours.  Feel forgetful about what happened after the procedure.  Have poor judgment for several hours.  Feel nauseous or vomit.  Have a sore throat if you had a breathing tube during the procedure.  Follow these instructions at home: For at least 24 hours after the procedure:   Do not: ? Participate in activities in which you could fall or become injured. ? Drive. ? Use heavy machinery. ? Drink alcohol. ? Take sleeping pills or medicines that cause drowsiness. ? Make important decisions or sign legal documents. ? Take care of children on your own.  Rest. Eating and  drinking  Follow the diet that is recommended by your health care provider.  If you vomit, drink water, juice, or soup when you can drink without vomiting.  Make sure you have little or no nausea before eating solid foods. General instructions  Have a responsible adult stay with you until you are awake and alert.  Take over-the-counter and prescription medicines only as told by your health care provider.  If you smoke, do not smoke without supervision.  Keep all follow-up visits as told by your health care provider. This is important. Contact a health care provider if:  You keep feeling nauseous or you keep vomiting.  You feel light-headed.  You develop a rash.  You have a fever. Get help right away if:  You have trouble breathing. This information is not intended to replace advice given to you by your health care provider. Make sure you discuss any questions you have with your health care provider. Document Released: 04/10/2015 Document Revised: 08/10/2015 Document Reviewed: 04/10/2015 Elsevier Interactive Patient Education  [employer].

## 2017-09-26 NOTE — H&P (Signed)
James Cervantes; [id number]; 1950/03/11   HPI Patient is a 67 year old black male who was referred to my care by Dr. Frances Furbish for evaluation treatment of a tender knot along the inner aspect of the right knee.  Patient states is been present for several months, but is increasing in size and causing him pain.  He states his pain is 10 out of 10 when pressure is applied on it.  It is not affected with movement.  He denies any knee swelling.  He denies any fever or drainage.  He states he does have a history of arthritis. Past Medical History:  Diagnosis Date  . Anemia   . Arthritis   . Asthma   . Cardiomyopathy ([place])    a. 2014: EF 40-45%, diffuse HK, and Grade 2 DD  . Dyspnea   . Emphysema   . GERD (gastroesophageal reflux disease)   . Hip pain   . Peripheral vascular disease ([place])   . Prostate disorder     Past Surgical History:  Procedure Laterality Date  . ABDOMINAL SURGERY    . CATARACT EXTRACTION W/PHACO Left 11/26/2016   Procedure: CATARACT EXTRACTION PHACO AND INTRAOCULAR LENS PLACEMENT LEFT EYE;  Surgeon: Tonny Branch, MD;  Location: [facility];  Service: Ophthalmology;  Laterality: Left;  CDE: 26.53   . CATARACT EXTRACTION W/PHACO Right 01/28/2017   Procedure: CATARACT EXTRACTION PHACO AND INTRAOCULAR LENS PLACEMENT RIGHT EYE;  Surgeon: Tonny Branch, MD;  Location: [facility];  Service: Ophthalmology;  Laterality: Right;  CDE: 6.79  . HERNIA REPAIR    . LUNG SURGERY    . VASCULAR SURGERY      Family History  Problem Relation Age of Onset  . Cancer Mother   . Hypertension Mother     Current Outpatient Medications on File Prior to Visit  Medication Sig Dispense Refill  . albuterol (PROAIR HFA) 108 (90 BASE) MCG/ACT inhaler Inhale 2 puffs into the lungs every 6 (six) hours as needed for wheezing or shortness of breath.    . dexamethasone (DECADRON) 4 MG tablet Take 1 tablet (4 mg total) by mouth 2 (two) times daily with a meal. 10 tablet 0  . furosemide (LASIX) 40 MG tablet Take 1  tablet (40 mg total) by mouth daily. 30 tablet 11  . losartan (COZAAR) 25 MG tablet Take 1 tablet (25 mg total) by mouth daily. 30 tablet 12  . metoprolol succinate (TOPROL-XL) 25 MG 24 hr tablet Take 0.5 tablets (12.5 mg total) by mouth daily. 30 tablet 12  . Oxycodone HCl 10 MG TABS Take 5 mg by mouth at bedtime.     . potassium chloride (K-DUR) 10 MEQ tablet Take 1 tablet (10 mEq total) by mouth daily. 30 tablet 12  . tamsulosin (FLOMAX) 0.4 MG CAPS capsule Take 0.4 mg by mouth at bedtime.     . traMADol (ULTRAM) 50 MG tablet Take 1 tablet (50 mg total) by mouth every 6 (six) hours as needed. 12 tablet 0   No current facility-administered medications on file prior to visit.     No Known Allergies  Social History   Substance and Sexual Activity  Alcohol Use No    Social History   Tobacco Use  Smoking Status Current Every Day Smoker  . Packs/day: 0.50  . Years: 50.00  . Pack years: 25.00  Smokeless Tobacco Never Used    Review of Systems  Constitutional: Negative.   HENT: Negative.   Eyes: Negative.   Respiratory: Positive for cough, shortness of  breath and wheezing.   Cardiovascular: Negative.   Gastrointestinal: Negative.   Genitourinary: Negative.   Musculoskeletal: Positive for joint pain.  Skin: Negative.   Neurological: Negative.   Endo/Heme/Allergies: Negative.   Psychiatric/Behavioral: Negative.     Objective   Vitals:   09/26/17 0902  BP: (!) 141/68  Pulse: 67  Resp: 18  Temp: 98.6 F (37 C)    Physical Exam  Constitutional: He is oriented to person, place, and time. He appears well-developed and well-nourished. No distress.  HENT:  Head: Normocephalic and atraumatic.  Cardiovascular: Normal rate, regular rhythm and normal heart sounds. Exam reveals no gallop.  No murmur heard. Pulmonary/Chest: Effort normal. No stridor. No respiratory distress. He has wheezes. He has no rales.  Neurological: He is alert and oriented to person, place, and time.   Skin: Skin is warm and dry.  1.5 cm ovoid, rubbery, mobile subcutaneous nodule along the medial aspect of the right knee.  It is not in continuity with the bone or the capsule.  Tender to touch.  Vitals reviewed. Dr. Cindie Laroche notes reviewed  Assessment  Cyst, right knee Plan   Patient is scheduled for excision of the cyst, right knee on 10/04/2017.  The risks and benefits of the procedure including bleeding, infection, and recurrence of the cyst were fully explained to the patient, who gave informed consent.

## 2017-10-01 ENCOUNTER — Encounter (HOSPITAL_COMMUNITY): Payer: Self-pay

## 2017-10-01 ENCOUNTER — Encounter (HOSPITAL_COMMUNITY)
Admission: RE | Admit: 2017-10-01 | Discharge: 2017-10-01 | Disposition: A | Discharge status: Home / Self Care | Payer: Medicare Other | Source: Ambulatory Visit | Attending: General Surgery | Admitting: General Surgery

## 2017-10-01 ENCOUNTER — Other Ambulatory Visit: Payer: Self-pay

## 2017-10-01 DIAGNOSIS — K219 Gastro-esophageal reflux disease without esophagitis: Secondary | ICD-10-CM | POA: Diagnosis not present

## 2017-10-01 DIAGNOSIS — I10 Essential (primary) hypertension: Secondary | ICD-10-CM | POA: Insufficient documentation

## 2017-10-01 DIAGNOSIS — I739 Peripheral vascular disease, unspecified: Secondary | ICD-10-CM | POA: Insufficient documentation

## 2017-10-01 DIAGNOSIS — D649 Anemia, unspecified: Secondary | ICD-10-CM | POA: Insufficient documentation

## 2017-10-01 DIAGNOSIS — Z01812 Encounter for preprocedural laboratory examination: Secondary | ICD-10-CM | POA: Diagnosis present

## 2017-10-01 HISTORY — DX: Essential (primary) hypertension: I10

## 2017-10-01 LAB — BASIC METABOLIC PANEL
Anion gap: 9 (ref 5–15)
BUN: 21 mg/dL (ref 8–23)
CALCIUM: 8.8 mg/dL — AB (ref 8.9–10.3)
CO2: 26 mmol/L (ref 22–32)
Chloride: 105 mmol/L (ref 98–111)
Creatinine, Ser: 1.2 mg/dL (ref 0.61–1.24)
GFR calc Af Amer: >60 mL/min (ref >60)
Glucose, Bld: 129 mg/dL — ABNORMAL HIGH (ref 70–99)
POTASSIUM: 3.9 mmol/L (ref 3.5–5.1)
Sodium: 140 mmol/L (ref 135–145)

## 2017-10-01 LAB — CBC
HEMATOCRIT: 39.3 % (ref 39.0–52.0)
HEMOGLOBIN: 13 g/dL (ref 13.0–17.0)
MCH: 29.8 pg (ref 26.0–34.0)
MCHC: 33.1 g/dL (ref 30.0–36.0)
MCV: 90.1 fL (ref 78.0–100.0)
Platelets: 293 10*3/uL (ref 150–400)
RBC: 4.36 MIL/uL (ref 4.22–5.81)
RDW: 12.8 % (ref 11.5–15.5)
WBC: 9.6 10*3/uL (ref 4.0–10.5)

## 2017-10-03 ENCOUNTER — Encounter (HOSPITAL_COMMUNITY): Payer: Self-pay | Admitting: Anesthesiology

## 2017-10-04 ENCOUNTER — Encounter (HOSPITAL_COMMUNITY)
Admission: RE | Disposition: A | Discharge status: Home / Self Care | Payer: Self-pay | Source: Ambulatory Visit | Attending: General Surgery

## 2017-10-04 ENCOUNTER — Ambulatory Visit (HOSPITAL_COMMUNITY)
Admission: RE | Admit: 2017-10-04 | Discharge: 2017-10-04 | Disposition: A | Discharge status: Home / Self Care | Payer: Medicare Other | Source: Ambulatory Visit | Attending: General Surgery | Admitting: General Surgery

## 2017-10-04 ENCOUNTER — Encounter (HOSPITAL_COMMUNITY): Payer: Self-pay

## 2017-10-04 SURGERY — EXCISION MASS
Anesthesia: Choice | Laterality: Right

## 2017-10-04 MED ORDER — MIDAZOLAM HCL 2 MG/2ML IJ SOLN
INTRAMUSCULAR | Status: AC
  Filled 2017-10-04: qty 2

## 2017-10-04 MED ORDER — CHLORHEXIDINE GLUCONATE CLOTH 2 % EX PADS: 6.0000 | MEDICATED_PAD | Freq: Once | CUTANEOUS | Status: DC

## 2017-10-04 MED ORDER — FENTANYL CITRATE (PF) 100 MCG/2ML IJ SOLN
INTRAMUSCULAR | Status: AC
  Filled 2017-10-04: qty 2

## 2017-10-04 NOTE — Progress Notes (Signed)
Patient did not show for scheduled surgery appt.  Patient was called and woken up and stated that he was on his way.  Patient stated that he drove himself to the hospital and planned to drive himself home.  He did not want his family involved because "he was a man and can take care of himself.  He could drive and would be fine after surgery because he has been on his own since he was 3.  Dr Arnoldo Morale discussed the need for a driver with patient and patient decided to cancel procedure.  Patient got dressed and was informed that he needed to call Dr Arnoldo Morale office to reschedule.

## 2017-10-16 NOTE — H&P (Signed)
James Cervantes; [id number]; May 25, 1950   HPI Patient is a 67 year old black male who was referred to my care by Dr. Frances Furbish for evaluation treatment of a tender knot along the inner aspect of the right knee.  Patient states is been present for several months, but is increasing in size and causing him pain.  He states his pain is 10 out of 10 when pressure is applied on it.  It is not affected with movement.  He denies any knee swelling.  He denies any fever or drainage.  He states he does have a history of arthritis. Past Medical History:  Diagnosis Date  . Anemia   . Arthritis   . Asthma   . Cardiomyopathy ([place])    a. 2014: EF 40-45%, diffuse HK, and Grade 2 DD  . Dyspnea   . Emphysema   . GERD (gastroesophageal reflux disease)   . Hip pain   . Peripheral vascular disease ([place])   . Prostate disorder     Past Surgical History:  Procedure Laterality Date  . ABDOMINAL SURGERY    . CATARACT EXTRACTION W/PHACO Left 11/26/2016   Procedure: CATARACT EXTRACTION PHACO AND INTRAOCULAR LENS PLACEMENT LEFT EYE;  Surgeon: Tonny Branch, MD;  Location: [facility];  Service: Ophthalmology;  Laterality: Left;  CDE: 26.53   . CATARACT EXTRACTION W/PHACO Right 01/28/2017   Procedure: CATARACT EXTRACTION PHACO AND INTRAOCULAR LENS PLACEMENT RIGHT EYE;  Surgeon: Tonny Branch, MD;  Location: [facility];  Service: Ophthalmology;  Laterality: Right;  CDE: 6.79  . HERNIA REPAIR    . LUNG SURGERY    . VASCULAR SURGERY      Family History  Problem Relation Age of Onset  . Cancer Mother   . Hypertension Mother     Current Outpatient Medications on File Prior to Visit  Medication Sig Dispense Refill  . albuterol (PROAIR HFA) 108 (90 BASE) MCG/ACT inhaler Inhale 2 puffs into the lungs every 6 (six) hours as needed for wheezing or shortness of breath.    . dexamethasone (DECADRON) 4 MG tablet Take 1 tablet (4 mg total) by mouth 2 (two) times daily with a meal. 10 tablet 0  . furosemide (LASIX) 40 MG tablet Take 1  tablet (40 mg total) by mouth daily. 30 tablet 11  . losartan (COZAAR) 25 MG tablet Take 1 tablet (25 mg total) by mouth daily. 30 tablet 12  . metoprolol succinate (TOPROL-XL) 25 MG 24 hr tablet Take 0.5 tablets (12.5 mg total) by mouth daily. 30 tablet 12  . Oxycodone HCl 10 MG TABS Take 5 mg by mouth at bedtime.     . potassium chloride (K-DUR) 10 MEQ tablet Take 1 tablet (10 mEq total) by mouth daily. 30 tablet 12  . tamsulosin (FLOMAX) 0.4 MG CAPS capsule Take 0.4 mg by mouth at bedtime.     . traMADol (ULTRAM) 50 MG tablet Take 1 tablet (50 mg total) by mouth every 6 (six) hours as needed. 12 tablet 0   No current facility-administered medications on file prior to visit.     No Known Allergies  Social History   Substance and Sexual Activity  Alcohol Use No    Social History   Tobacco Use  Smoking Status Current Every Day Smoker  . Packs/day: 0.50  . Years: 50.00  . Pack years: 25.00  Smokeless Tobacco Never Used    Review of Systems  Constitutional: Negative.   HENT: Negative.   Eyes: Negative.   Respiratory: Positive for cough, shortness of  breath and wheezing.   Cardiovascular: Negative.   Gastrointestinal: Negative.   Genitourinary: Negative.   Musculoskeletal: Positive for joint pain.  Skin: Negative.   Neurological: Negative.   Endo/Heme/Allergies: Negative.   Psychiatric/Behavioral: Negative.     Objective   Vitals:   09/26/17 0902  BP: (!) 141/68  Pulse: 67  Resp: 18  Temp: 98.6 F (37 C)    Physical Exam  Constitutional: He is oriented to person, place, and time. He appears well-developed and well-nourished. No distress.  HENT:  Head: Normocephalic and atraumatic.  Cardiovascular: Normal rate, regular rhythm and normal heart sounds. Exam reveals no gallop.  No murmur heard. Pulmonary/Chest: Effort normal. No stridor. No respiratory distress. He has wheezes. He has no rales.  Neurological: He is alert and oriented to person, place, and time.   Skin: Skin is warm and dry.  1.5 cm ovoid, rubbery, mobile subcutaneous nodule along the medial aspect of the right knee.  It is not in continuity with the bone or the capsule.  Tender to touch.  Vitals reviewed. Dr. Cindie Laroche notes reviewed  Assessment  Cyst, right knee Plan   Patient is scheduled for excision of the cyst, right knee on 10/04/2017.  The risks and benefits of the procedure including bleeding, infection, and recurrence of the cyst were fully explained to the patient, who gave informed consent.

## 2017-10-29 ENCOUNTER — Encounter (HOSPITAL_COMMUNITY): Payer: Self-pay

## 2017-10-30 ENCOUNTER — Encounter (HOSPITAL_COMMUNITY)
Admission: RE | Admit: 2017-10-30 | Discharge: 2017-10-30 | Disposition: A | Discharge status: Home / Self Care | Payer: Medicare Other | Source: Ambulatory Visit | Attending: General Surgery | Admitting: General Surgery

## 2017-11-01 ENCOUNTER — Ambulatory Visit (HOSPITAL_COMMUNITY): Payer: Medicare Other | Admitting: Anesthesiology

## 2017-11-01 ENCOUNTER — Ambulatory Visit (HOSPITAL_COMMUNITY)
Admission: RE | Admit: 2017-11-01 | Discharge: 2017-11-01 | Disposition: A | Discharge status: Home / Self Care | Payer: Medicare Other | Source: Ambulatory Visit | Attending: General Surgery | Admitting: General Surgery

## 2017-11-01 ENCOUNTER — Encounter (HOSPITAL_COMMUNITY)
Admission: RE | Disposition: A | Discharge status: Home / Self Care | Payer: Self-pay | Source: Ambulatory Visit | Attending: General Surgery

## 2017-11-01 DIAGNOSIS — K219 Gastro-esophageal reflux disease without esophagitis: Secondary | ICD-10-CM | POA: Insufficient documentation

## 2017-11-01 DIAGNOSIS — M1990 Unspecified osteoarthritis, unspecified site: Secondary | ICD-10-CM | POA: Diagnosis not present

## 2017-11-01 DIAGNOSIS — D1779 Benign lipomatous neoplasm of other sites: Secondary | ICD-10-CM

## 2017-11-01 DIAGNOSIS — L723 Sebaceous cyst: Secondary | ICD-10-CM | POA: Diagnosis not present

## 2017-11-01 DIAGNOSIS — F17210 Nicotine dependence, cigarettes, uncomplicated: Secondary | ICD-10-CM | POA: Diagnosis not present

## 2017-11-01 DIAGNOSIS — R2241 Localized swelling, mass and lump, right lower limb: Secondary | ICD-10-CM | POA: Diagnosis present

## 2017-11-01 DIAGNOSIS — J449 Chronic obstructive pulmonary disease, unspecified: Secondary | ICD-10-CM | POA: Diagnosis not present

## 2017-11-01 DIAGNOSIS — I429 Cardiomyopathy, unspecified: Secondary | ICD-10-CM | POA: Insufficient documentation

## 2017-11-01 DIAGNOSIS — I110 Hypertensive heart disease with heart failure: Secondary | ICD-10-CM | POA: Insufficient documentation

## 2017-11-01 DIAGNOSIS — I509 Heart failure, unspecified: Secondary | ICD-10-CM | POA: Insufficient documentation

## 2017-11-01 DIAGNOSIS — I739 Peripheral vascular disease, unspecified: Secondary | ICD-10-CM | POA: Diagnosis not present

## 2017-11-01 DIAGNOSIS — D1723 Benign lipomatous neoplasm of skin and subcutaneous tissue of right leg: Secondary | ICD-10-CM | POA: Insufficient documentation

## 2017-11-01 DIAGNOSIS — D1720 Benign lipomatous neoplasm of skin and subcutaneous tissue of unspecified limb: Secondary | ICD-10-CM

## 2017-11-01 HISTORY — PX: MASS EXCISION: SHX2000

## 2017-11-01 SURGERY — EXCISION MASS
Anesthesia: Monitor Anesthesia Care | Site: Knee | Laterality: Right

## 2017-11-01 MED ORDER — PROPOFOL 500 MG/50ML IV EMUL
INTRAVENOUS | Status: DC | PRN
  Administered 2017-11-01: 100 ug/kg/min via INTRAVENOUS

## 2017-11-01 MED ORDER — FENTANYL CITRATE (PF) 100 MCG/2ML IJ SOLN
INTRAMUSCULAR | Status: AC
  Filled 2017-11-01: qty 2

## 2017-11-01 MED ORDER — FENTANYL CITRATE (PF) 100 MCG/2ML IJ SOLN
INTRAMUSCULAR | Status: DC | PRN
  Administered 2017-11-01: 25 ug via INTRAVENOUS

## 2017-11-01 MED ORDER — TRAMADOL HCL 50 MG PO TABS: 50.0000 mg | ORAL_TABLET | Freq: Four times a day (QID) | ORAL | 0 refills | Status: DC | PRN

## 2017-11-01 MED ORDER — HYDROMORPHONE HCL 1 MG/ML IJ SOLN: 0.2500 mg | INTRAMUSCULAR | Status: DC | PRN

## 2017-11-01 MED ORDER — CHLORHEXIDINE GLUCONATE CLOTH 2 % EX PADS: 6.0000 | MEDICATED_PAD | Freq: Once | CUTANEOUS | Status: DC

## 2017-11-01 MED ORDER — PROPOFOL 10 MG/ML IV BOLUS
INTRAVENOUS | Status: DC | PRN
  Administered 2017-11-01: 20 mg via INTRAVENOUS

## 2017-11-01 MED ORDER — ALBUTEROL SULFATE HFA 108 (90 BASE) MCG/ACT IN AERS: 2.0000 | INHALATION_SPRAY | Freq: Four times a day (QID) | RESPIRATORY_TRACT | 0 refills | Status: DC | PRN

## 2017-11-01 MED ORDER — PROPOFOL 10 MG/ML IV BOLUS
INTRAVENOUS | Status: AC
  Filled 2017-11-01: qty 40

## 2017-11-01 MED ORDER — PROMETHAZINE HCL 25 MG/ML IJ SOLN: 6.2500 mg | INTRAMUSCULAR | Status: DC | PRN

## 2017-11-01 MED ORDER — CEFAZOLIN SODIUM-DEXTROSE 2-4 GM/100ML-% IV SOLN
INTRAVENOUS | Status: AC
  Filled 2017-11-01: qty 100

## 2017-11-01 MED ORDER — HYDROCODONE-ACETAMINOPHEN 7.5-325 MG PO TABS: 1.0000 | ORAL_TABLET | Freq: Once | ORAL | Status: DC | PRN

## 2017-11-01 MED ORDER — KETOROLAC TROMETHAMINE 30 MG/ML IJ SOLN
INTRAMUSCULAR | Status: AC
  Filled 2017-11-01: qty 1

## 2017-11-01 MED ORDER — LIDOCAINE HCL (PF) 1 % IJ SOLN
INTRAMUSCULAR | Status: DC | PRN
  Administered 2017-11-01: 2 mL

## 2017-11-01 MED ORDER — LACTATED RINGERS IV SOLN
INTRAVENOUS | Status: DC
  Administered 2017-11-01: 08:00:00 via INTRAVENOUS

## 2017-11-01 MED ORDER — BUPIVACAINE HCL (PF) 0.5 % IJ SOLN
INTRAMUSCULAR | Status: AC
  Filled 2017-11-01: qty 30

## 2017-11-01 MED ORDER — MIDAZOLAM HCL 2 MG/2ML IJ SOLN: 0.5000 mg | Freq: Once | INTRAMUSCULAR | Status: DC | PRN

## 2017-11-01 MED ORDER — SEVOFLURANE IN SOLN
RESPIRATORY_TRACT | Status: AC
  Filled 2017-11-01: qty 250

## 2017-11-01 MED ORDER — CEFAZOLIN SODIUM-DEXTROSE 2-4 GM/100ML-% IV SOLN
2.0000 g | INTRAVENOUS | Status: AC
  Administered 2017-11-01: 2 g via INTRAVENOUS

## 2017-11-01 MED ORDER — KETOROLAC TROMETHAMINE 30 MG/ML IJ SOLN
30.0000 mg | Freq: Once | INTRAMUSCULAR | Status: AC
  Administered 2017-11-01: 30 mg via INTRAVENOUS

## 2017-11-01 MED ORDER — 0.9 % SODIUM CHLORIDE (POUR BTL) OPTIME
TOPICAL | Status: DC | PRN
  Administered 2017-11-01: 1000 mL

## 2017-11-01 MED ORDER — LIDOCAINE HCL (PF) 1 % IJ SOLN
INTRAMUSCULAR | Status: AC
  Filled 2017-11-01: qty 30

## 2017-11-01 SURGICAL SUPPLY — 31 items
BLADE SURG SZ11 CARB STEEL (BLADE) IMPLANT
CHLORAPREP W/TINT 10.5 ML (MISCELLANEOUS) ×2 IMPLANT
CLOTH BEACON ORANGE TIMEOUT ST (SAFETY) ×2 IMPLANT
COVER LIGHT HANDLE STERIS (MISCELLANEOUS) ×4 IMPLANT
DECANTER SPIKE VIAL GLASS SM (MISCELLANEOUS) ×2 IMPLANT
DERMABOND ADVANCED (GAUZE/BANDAGES/DRESSINGS) ×1
DERMABOND ADVANCED .7 DNX12 (GAUZE/BANDAGES/DRESSINGS) ×1 IMPLANT
DRAPE EENT ADH APERT 31X51 STR (DRAPES) IMPLANT
ELECT NEEDLE TIP 2.8 STRL (NEEDLE) ×2 IMPLANT
ELECT REM PT RETURN 9FT ADLT (ELECTROSURGICAL) ×2
ELECTRODE REM PT RTRN 9FT ADLT (ELECTROSURGICAL) ×1 IMPLANT
GAUZE SPONGE 4X4 12PLY STRL (GAUZE/BANDAGES/DRESSINGS) ×2 IMPLANT
GLOVE BIOGEL PI IND STRL 7.0 (GLOVE) ×1 IMPLANT
GLOVE BIOGEL PI INDICATOR 7.0 (GLOVE) ×1
GLOVE SURG SS PI 7.5 STRL IVOR (GLOVE) ×2 IMPLANT
GOWN STRL REUS W/ TWL XL LVL3 (GOWN DISPOSABLE) ×1 IMPLANT
GOWN STRL REUS W/TWL LRG LVL3 (GOWN DISPOSABLE) ×2 IMPLANT
GOWN STRL REUS W/TWL XL LVL3 (GOWN DISPOSABLE) ×1
KIT TURNOVER KIT A (KITS) ×2 IMPLANT
MANIFOLD NEPTUNE II (INSTRUMENTS) ×2 IMPLANT
NEEDLE HYPO 25X1 1.5 SAFETY (NEEDLE) ×2 IMPLANT
NS IRRIG 1000ML POUR BTL (IV SOLUTION) ×2 IMPLANT
PACK MINOR (CUSTOM PROCEDURE TRAY) ×2 IMPLANT
PAD ARMBOARD 7.5X6 YLW CONV (MISCELLANEOUS) ×2 IMPLANT
SET BASIN LINEN APH (SET/KITS/TRAYS/PACK) ×2 IMPLANT
SUT ETHILON 3 0 FSL (SUTURE) IMPLANT
SUT MNCRL AB 4-0 PS2 18 (SUTURE) ×2 IMPLANT
SUT PROLENE 4 0 PS 2 18 (SUTURE) IMPLANT
SUT VIC AB 3-0 SH 27 (SUTURE)
SUT VIC AB 3-0 SH 27X BRD (SUTURE) IMPLANT
SYR CONTROL 10ML LL (SYRINGE) ×2 IMPLANT

## 2017-11-01 NOTE — Discharge Instructions (Signed)
PATIENT INSTRUCTIONS POST-ANESTHESIA  IMMEDIATELY FOLLOWING SURGERY:  Do not drive or operate machinery for the first twenty four hours after surgery.  Do not make any important decisions for twenty four hours after surgery or while taking narcotic pain medications or sedatives.  If you develop intractable nausea and vomiting or a severe headache please notify your doctor immediately.  FOLLOW-UP:  Please make an appointment with your surgeon as instructed. You do not need to follow up with anesthesia unless specifically instructed to do so.  WOUND CARE INSTRUCTIONS (if applicable):  Keep a dry clean dressing on the anesthesia/puncture wound site if there is drainage.  Once the wound has quit draining you may leave it open to air.  Generally you should leave the bandage intact for twenty four hours unless there is drainage.  If the epidural site drains for more than 36-48 hours please call the anesthesia department.  QUESTIONS?:  Please feel free to call your physician or the hospital operator if you have any questions, and they will be happy to assist you.       Wound Care, Adult Taking care of your wound properly can help to prevent pain and infection. It can also help your wound to heal more quickly. How is this treated? Wound care  Follow instructions from your health care provider about how to take care of your wound. Make sure you: ? Wash your hands with soap and water before you change the bandage (dressing). If soap and water are not available, use hand sanitizer. ? Change your dressing as told by your health care provider. ? Leave stitches (sutures), skin glue, or adhesive strips in place. These skin closures may need to stay in place for 2 weeks or longer. If adhesive strip edges start to loosen and curl up, you may trim the loose edges. Do not remove adhesive strips completely unless your health care provider tells you to do that.  Check your wound area every day for signs of  infection. Check for: ? More redness, swelling, or pain. ? More fluid or blood. ? Warmth. ? Pus or a bad smell.  Ask your health care provider if you should clean the wound with mild soap and water. Doing this may include: ? Using a clean towel to pat the wound dry after cleaning it. Do not rub or scrub the wound. ? Applying a cream or ointment. Do this only as told by your health care provider. ? Covering the incision with a clean dressing.  Ask your health care provider when you can leave the wound uncovered. Medicines   If you were prescribed an antibiotic medicine, cream, or ointment, take or use the antibiotic as told by your health care provider. Do not stop taking or using the antibiotic even if your condition improves.  Take over-the-counter and prescription medicines only as told by your health care provider. If you were prescribed pain medicine, take it at least 30 minutes before doing any wound care or as told by your health care provider. General instructions  Return to your normal activities as told by your health care provider. Ask your health care provider what activities are safe.  Do not scratch or pick at the wound.  Keep all follow-up visits as told by your health care provider. This is important.  Eat a diet that includes protein, vitamin A, vitamin C, and other nutrient-rich foods. These help the wound heal: ? Protein-rich foods include meat, dairy, beans, nuts, and other sources. ? Vitamin A-rich  foods include carrots and dark green, leafy vegetables. ? Vitamin C-rich foods include citrus, tomatoes, and other fruits and vegetables. ? Nutrient-rich foods have protein, carbohydrates, fat, vitamins, or minerals. Eat a variety of healthy foods including vegetables, fruits, and whole grains. Contact a health care provider if:  You received a tetanus shot and you have swelling, severe pain, redness, or bleeding at the injection site.  Your pain is not controlled  with medicine.  You have more redness, swelling, or pain around the wound.  You have more fluid or blood coming from the wound.  Your wound feels warm to the touch.  You have pus or a bad smell coming from the wound.  You have a fever or chills.  You are nauseous or you vomit.  You are dizzy. Get help right away if:  You have a red streak going away from your wound.  The edges of the wound open up and separate.  Your wound is bleeding and the bleeding does not stop with gentle pressure.  You have a rash.  You faint.  You have trouble breathing. This information is not intended to replace advice given to you by your health care provider. Make sure you discuss any questions you have with your health care provider. Document Released: 09/27/2007 Document Revised: 08/17/2015 Document Reviewed: 07/05/2015 Elsevier Interactive Patient Education  2017 [employer].

## 2017-11-01 NOTE — Anesthesia Procedure Notes (Signed)
Procedure Name: MAC Date/Time: 11/01/2017 7:35 AM Performed by: Vista Deck, CRNA Pre-anesthesia Checklist: Patient identified, Emergency Drugs available, Suction available, Timeout performed and Patient being monitored Patient Re-evaluated:Patient Re-evaluated prior to induction Oxygen Delivery Method: Nasal Cannula

## 2017-11-01 NOTE — Op Note (Signed)
Patient:  James Cervantes  DOB:  Feb 11, 1950  MRN:  [id number]   Preop Diagnosis: Subcutaneous mass, right lower extremity  Postop Diagnosis: Same, lipoma  Procedure: Excision of 1.5 cm lipoma, right leg  Surgeon: Aviva Signs, MD  Anes: MAC  Indications: Patient is a 67 year old black male who presents with a tender subcutaneous mass along the medial aspect of the right knee.  The risks and benefits of the procedure were fully explained to the patient, who gave informed consent.  Procedure note: The patient was placed in supine position.  After monitored anesthesia care was given, the right lower extremity was prepped and draped using usual sterile technique with DuraPrep.  Surgical site confirmation was performed.  1% Xylocaine was used for local anesthesia.  A longitudinal incision was made over the mass which was along the medial aspect at the level of the right knee.  A lipoma was found.  It was excised without difficulty and disposed of.  A bleeding was controlled using Bovie electrocautery.  The skin was reapproximated using a 4-0 Monocryl subcuticular suture.  Dermabond was applied.  All tape and needle counts were correct at the end of the procedure.  Patient was awakened and transferred to PACU in stable condition.  Complications: None  EBL: Minimal  Specimen: None

## 2017-11-01 NOTE — Anesthesia Postprocedure Evaluation (Signed)
Anesthesia Post Note  Patient: James Cervantes  Procedure(s) Performed: EXCISION CYST RIGHT KNEE (Right Knee)  Patient location during evaluation: PACU Anesthesia Type: MAC Level of consciousness: awake and alert and patient cooperative Pain management: satisfactory to patient Vital Signs Assessment: post-procedure vital signs reviewed and stable Respiratory status: spontaneous breathing Cardiovascular status: stable Postop Assessment: no apparent nausea or vomiting Anesthetic complications: no     Last Vitals:  Vitals:   11/01/17 0807 11/01/17 0815  BP: 132/78 129/85  Pulse:  (!) 58  Resp: 18 13  Temp: (!) 36.4 C   SpO2: 99% 99%    Last Pain:  Vitals:   11/01/17 0815  TempSrc:   PainSc: 0-No pain                 Brianna Esson

## 2017-11-01 NOTE — Anesthesia Preprocedure Evaluation (Signed)
Anesthesia Evaluation  Patient identified by MRN, date of birth, ID band Patient awake    Reviewed: Allergy & Precautions, NPO status , Patient's Chart, lab work & pertinent test results, reviewed documented beta blocker date and time   Airway Mallampati: II  TM Distance: >3 FB Neck ROM: Full    Dental no notable dental hx. (+) Poor Dentition, Missing   Pulmonary shortness of breath, asthma , COPD,  COPD inhaler, Current Smoker,    Pulmonary exam normal  + decreased breath sounds      Cardiovascular Exercise Tolerance: Poor hypertension, Pt. on medications + Peripheral Vascular Disease and +CHF  Normal cardiovascular examII Rhythm:Regular Rate:Normal     Neuro/Psych  Headaches, negative psych ROS   GI/Hepatic Neg liver ROS, GERD  Medicated and Controlled,  Endo/Other  negative endocrine ROS  Renal/GU negative Renal ROS  negative genitourinary   Musculoskeletal  (+) Arthritis ,   Abdominal   Peds negative pediatric ROS (+)  Hematology negative hematology ROS (+) anemia ,   Anesthesia Other Findings   Reproductive/Obstetrics negative OB ROS                             Anesthesia Physical Anesthesia Plan  ASA: III  Anesthesia Plan: MAC   Post-op Pain Management:    Induction: Intravenous  PONV Risk Score and Plan:   Airway Management Planned: Nasal Cannula and Simple Face Mask  Additional Equipment:   Intra-op Plan:   Post-operative Plan:   Informed Consent: I have reviewed the patients History and Physical, chart, labs and discussed the procedure including the risks, benefits and alternatives for the proposed anesthesia with the patient or authorized representative who has indicated his/her understanding and acceptance.   Dental advisory given  Plan Discussed with: CRNA  Anesthesia Plan Comments:         Anesthesia Quick Evaluation

## 2017-11-01 NOTE — Interval H&P Note (Signed)
History and Physical Interval Note:  11/01/2017 7:15 AM  James Cervantes  has presented today for surgery, with the diagnosis of sebaceous cyst right knee  The various methods of treatment have been discussed with the patient and family. After consideration of risks, benefits and other options for treatment, the patient has consented to  Procedure(s): EXCISION CYST RIGHT KNEE (Right) as a surgical intervention .  The patient's history has been reviewed, patient examined, no change in status, stable for surgery.  I have reviewed the patient's chart and labs.  Questions were answered to the patient's satisfaction.     Aviva Signs

## 2017-11-01 NOTE — Transfer of Care (Signed)
Immediate Anesthesia Transfer of Care Note  Patient: James Cervantes  Procedure(s) Performed: EXCISION CYST RIGHT KNEE (Right )  Patient Location: PACU  Anesthesia Type:MAC  Level of Consciousness: awake and patient cooperative  Airway & Oxygen Therapy: Patient Spontanous Breathing  Post-op Assessment: Report given to RN and Post -op Vital signs reviewed and stable  Post vital signs: Reviewed and stable  Last Vitals:  Vitals Value Taken Time  BP    Temp    Pulse 58 11/01/2017  8:06 AM  Resp 10 11/01/2017  8:06 AM  SpO2 97 % 11/01/2017  8:06 AM  Vitals shown include unvalidated device data.  Last Pain:  Vitals:   11/01/17 0647  TempSrc: Oral  PainSc: 0-No pain      Patients Stated Pain Goal: 3 (28/36/62 9476)  Complications: No apparent anesthesia complications

## 2017-11-04 ENCOUNTER — Encounter (HOSPITAL_COMMUNITY): Payer: Self-pay | Admitting: General Surgery

## 2017-11-07 ENCOUNTER — Ambulatory Visit: Payer: Self-pay | Admitting: General Surgery

## 2017-12-15 ENCOUNTER — Observation Stay (HOSPITAL_COMMUNITY)
Admission: EM | Admit: 2017-12-15 | Discharge: 2017-12-16 | Disposition: A | Discharge status: Home / Self Care | Payer: Medicare Other | Attending: Family Medicine | Admitting: Family Medicine

## 2017-12-15 ENCOUNTER — Other Ambulatory Visit: Payer: Self-pay

## 2017-12-15 ENCOUNTER — Emergency Department (HOSPITAL_COMMUNITY): Payer: Medicare Other

## 2017-12-15 ENCOUNTER — Encounter (HOSPITAL_COMMUNITY): Payer: Self-pay | Admitting: Emergency Medicine

## 2017-12-15 DIAGNOSIS — I10 Essential (primary) hypertension: Secondary | ICD-10-CM | POA: Diagnosis not present

## 2017-12-15 DIAGNOSIS — Z23 Encounter for immunization: Secondary | ICD-10-CM | POA: Diagnosis not present

## 2017-12-15 DIAGNOSIS — J441 Chronic obstructive pulmonary disease with (acute) exacerbation: Secondary | ICD-10-CM | POA: Diagnosis not present

## 2017-12-15 DIAGNOSIS — R7989 Other specified abnormal findings of blood chemistry: Secondary | ICD-10-CM | POA: Diagnosis present

## 2017-12-15 DIAGNOSIS — Z9119 Patient's noncompliance with other medical treatment and regimen: Secondary | ICD-10-CM

## 2017-12-15 DIAGNOSIS — Z79899 Other long term (current) drug therapy: Secondary | ICD-10-CM | POA: Insufficient documentation

## 2017-12-15 DIAGNOSIS — R778 Other specified abnormalities of plasma proteins: Secondary | ICD-10-CM

## 2017-12-15 DIAGNOSIS — J45909 Unspecified asthma, uncomplicated: Secondary | ICD-10-CM | POA: Insufficient documentation

## 2017-12-15 DIAGNOSIS — J40 Bronchitis, not specified as acute or chronic: Secondary | ICD-10-CM | POA: Diagnosis not present

## 2017-12-15 DIAGNOSIS — F17200 Nicotine dependence, unspecified, uncomplicated: Secondary | ICD-10-CM | POA: Diagnosis present

## 2017-12-15 DIAGNOSIS — Z91199 Patient's noncompliance with other medical treatment and regimen due to unspecified reason: Secondary | ICD-10-CM

## 2017-12-15 DIAGNOSIS — R0602 Shortness of breath: Secondary | ICD-10-CM | POA: Diagnosis present

## 2017-12-15 LAB — TROPONIN I
TROPONIN I: 0.03 ng/mL — AB (ref <0.03)
Troponin I: 0.06 ng/mL (ref <0.03)

## 2017-12-15 LAB — CBC
HCT: 42.3 % (ref 39.0–52.0)
HEMOGLOBIN: 12.9 g/dL — AB (ref 13.0–17.0)
MCH: 27.7 pg (ref 26.0–34.0)
MCHC: 30.5 g/dL (ref 30.0–36.0)
MCV: 90.8 fL (ref 80.0–100.0)
Platelets: 282 10*3/uL (ref 150–400)
RBC: 4.66 MIL/uL (ref 4.22–5.81)
RDW: 13.6 % (ref 11.5–15.5)
WBC: 11.3 10*3/uL — ABNORMAL HIGH (ref 4.0–10.5)
nRBC: 0 % (ref 0.0–0.2)

## 2017-12-15 LAB — BASIC METABOLIC PANEL
ANION GAP: 8 (ref 5–15)
BUN: 12 mg/dL (ref 8–23)
CALCIUM: 8.8 mg/dL — AB (ref 8.9–10.3)
CHLORIDE: 104 mmol/L (ref 98–111)
CO2: 26 mmol/L (ref 22–32)
CREATININE: 0.96 mg/dL (ref 0.61–1.24)
GLUCOSE: 114 mg/dL — AB (ref 70–99)
Potassium: 3.9 mmol/L (ref 3.5–5.1)
SODIUM: 138 mmol/L (ref 135–145)

## 2017-12-15 MED ORDER — ASPIRIN 325 MG PO TABS
325.0000 mg | ORAL_TABLET | Freq: Every day | ORAL | Status: DC
  Administered 2017-12-16: 325 mg via ORAL
  Filled 2017-12-15: qty 1

## 2017-12-15 MED ORDER — PREDNISONE 10 MG PO TABS: 40.0000 mg | ORAL_TABLET | Freq: Every day | ORAL | 0 refills | Status: DC

## 2017-12-15 MED ORDER — PNEUMOCOCCAL VAC POLYVALENT 25 MCG/0.5ML IJ INJ
0.5000 mL | INJECTION | INTRAMUSCULAR | Status: AC
  Administered 2017-12-16: 0.5 mL via INTRAMUSCULAR
  Filled 2017-12-15: qty 0.5

## 2017-12-15 MED ORDER — OXYCODONE HCL 5 MG PO TABS
5.0000 mg | ORAL_TABLET | Freq: Once | ORAL | Status: AC
  Administered 2017-12-15: 5 mg via ORAL
  Filled 2017-12-15: qty 1

## 2017-12-15 MED ORDER — AZITHROMYCIN 250 MG PO TABS
500.0000 mg | ORAL_TABLET | ORAL | Status: DC
  Administered 2017-12-15: 500 mg via ORAL
  Filled 2017-12-15: qty 2

## 2017-12-15 MED ORDER — ENOXAPARIN SODIUM 40 MG/0.4ML ~~LOC~~ SOLN
40.0000 mg | SUBCUTANEOUS | Status: DC
  Administered 2017-12-16: 40 mg via SUBCUTANEOUS
  Filled 2017-12-15: qty 0.4

## 2017-12-15 MED ORDER — PREDNISONE 50 MG PO TABS
60.0000 mg | ORAL_TABLET | Freq: Once | ORAL | Status: AC
  Administered 2017-12-15: 60 mg via ORAL
  Filled 2017-12-15: qty 1

## 2017-12-15 MED ORDER — ALBUTEROL SULFATE (2.5 MG/3ML) 0.083% IN NEBU
5.0000 mg | INHALATION_SOLUTION | Freq: Once | RESPIRATORY_TRACT | Status: AC
  Administered 2017-12-15: 5 mg via RESPIRATORY_TRACT
  Filled 2017-12-15: qty 6

## 2017-12-15 MED ORDER — ATORVASTATIN CALCIUM 40 MG PO TABS
40.0000 mg | ORAL_TABLET | Freq: Every day | ORAL | Status: DC
  Administered 2017-12-15: 40 mg via ORAL
  Filled 2017-12-15: qty 1

## 2017-12-15 MED ORDER — IPRATROPIUM-ALBUTEROL 0.5-2.5 (3) MG/3ML IN SOLN
3.0000 mL | RESPIRATORY_TRACT | Status: DC | PRN
  Administered 2017-12-15: 3 mL via RESPIRATORY_TRACT

## 2017-12-15 MED ORDER — ALBUTEROL SULFATE (2.5 MG/3ML) 0.083% IN NEBU
INHALATION_SOLUTION | RESPIRATORY_TRACT | Status: AC
  Administered 2017-12-15: 2.5 mg
  Filled 2017-12-15: qty 3

## 2017-12-15 MED ORDER — METOPROLOL SUCCINATE ER 25 MG PO TB24
12.5000 mg | ORAL_TABLET | Freq: Every day | ORAL | Status: DC
  Administered 2017-12-15 – 2017-12-16 (×2): 12.5 mg via ORAL
  Filled 2017-12-15 (×2): qty 1

## 2017-12-15 MED ORDER — METHYLPREDNISOLONE SODIUM SUCC 40 MG IJ SOLR
40.0000 mg | Freq: Two times a day (BID) | INTRAMUSCULAR | Status: DC
  Administered 2017-12-16: 40 mg via INTRAVENOUS
  Filled 2017-12-15: qty 1

## 2017-12-15 MED ORDER — POLYETHYLENE GLYCOL 3350 17 G PO PACK: 17.0000 g | PACK | Freq: Every day | ORAL | Status: DC | PRN

## 2017-12-15 MED ORDER — ALBUTEROL SULFATE (2.5 MG/3ML) 0.083% IN NEBU
2.5000 mg | INHALATION_SOLUTION | RESPIRATORY_TRACT | Status: DC | PRN
  Administered 2017-12-16: 2.5 mg via RESPIRATORY_TRACT
  Filled 2017-12-15: qty 3

## 2017-12-15 MED ORDER — ONDANSETRON HCL 4 MG PO TABS: 4.0000 mg | ORAL_TABLET | Freq: Four times a day (QID) | ORAL | Status: DC | PRN

## 2017-12-15 MED ORDER — ONDANSETRON HCL 4 MG/2ML IJ SOLN: 4.0000 mg | Freq: Four times a day (QID) | INTRAMUSCULAR | Status: DC | PRN

## 2017-12-15 MED ORDER — IPRATROPIUM-ALBUTEROL 0.5-2.5 (3) MG/3ML IN SOLN
3.0000 mL | Freq: Four times a day (QID) | RESPIRATORY_TRACT | Status: DC
  Administered 2017-12-16: 3 mL via RESPIRATORY_TRACT
  Filled 2017-12-15 (×2): qty 3

## 2017-12-15 MED ORDER — LOSARTAN POTASSIUM 50 MG PO TABS
25.0000 mg | ORAL_TABLET | Freq: Every day | ORAL | Status: DC
  Administered 2017-12-15 – 2017-12-16 (×2): 25 mg via ORAL
  Filled 2017-12-15 (×2): qty 1

## 2017-12-15 MED ORDER — FUROSEMIDE 40 MG PO TABS
40.0000 mg | ORAL_TABLET | Freq: Every day | ORAL | Status: DC
  Administered 2017-12-16: 40 mg via ORAL
  Filled 2017-12-15: qty 1

## 2017-12-15 MED ORDER — IPRATROPIUM-ALBUTEROL 0.5-2.5 (3) MG/3ML IN SOLN
3.0000 mL | Freq: Once | RESPIRATORY_TRACT | Status: AC
  Administered 2017-12-15: 3 mL via RESPIRATORY_TRACT
  Filled 2017-12-15: qty 3

## 2017-12-15 MED ORDER — ALBUTEROL SULFATE HFA 108 (90 BASE) MCG/ACT IN AERS
2.0000 | INHALATION_SPRAY | Freq: Four times a day (QID) | RESPIRATORY_TRACT | Status: DC
  Administered 2017-12-15: 2 via RESPIRATORY_TRACT
  Filled 2017-12-15: qty 6.7

## 2017-12-15 MED ORDER — DM-GUAIFENESIN ER 30-600 MG PO TB12
1.0000 | ORAL_TABLET | Freq: Two times a day (BID) | ORAL | Status: DC
  Administered 2017-12-15 – 2017-12-16 (×2): 1 via ORAL
  Filled 2017-12-15 (×2): qty 1

## 2017-12-15 NOTE — ED Triage Notes (Signed)
Sob x 2 hours.  Has tried nebs and rescue inhaler at home with no relief.  resp labored.

## 2017-12-15 NOTE — ED Notes (Signed)
Attempted to obtain IV access.  Pt points to area of arm where he wants the iv placed.  Explained to pt I would try that area but I will have to use an area that I feel is appropriate.  Pt states " well you aint sticking me then. "  Charge nurse notified.

## 2017-12-15 NOTE — ED Notes (Signed)
Pt rang call light to ask for some water, ed tech took the pt water and pt states he does not want it now because it took too long.

## 2017-12-15 NOTE — H&P (Signed)
History and Physical    James Cervantes ZOX:[id number] DOB: Aug 11, 1950 DOA: 12/15/2017  PCP: Lucia Gaskins, MD   Patient coming from: Home  I have personally briefly reviewed patient's old medical records in [place]  Chief Complaint: SOB  HPI: James Cervantes is a 67 y.o. male with medical history significant for CHF, hepatitis C, BPH, COPD, who presented to the ED with complaints of shortness of breath that started this morning.  Associated with wheezing and cough mildly productive of yellowish sputum.  He smokes 5 cigarettes/day.  Patient also reports left-sided , pressure-like chest pain that started after onset of difficulty breathing.  He was sitting down when chest pain started, and persisted through he got to the ED.  And then spontaneously subsided.  No leg swelling or pain.  No fevers or chills.  Patient is intermittently compliant with 40 mg Lasix, and antihypertensives.  ED Course: Initial O2 sats 88% improved to >93% on room air, intermittent tachypnea to 30.  Two-view chest x-ray-stable postop changes of left lung volume loss, No acute abnormality.  WBC 11.3.  Troponin 0.03 >> 0.06.  EKG PVCs unchanged from prior.  Hospitalist to admit for elevated troponin and COPD exacerbation.  Review of Systems: As per HPI all other systems reviewed and negative.  Past Medical History:  Diagnosis Date  . Anemia   . Arthritis   . Asthma   . Cardiomyopathy ([place])    a. 2014: EF 40-45%, diffuse HK, and Grade 2 DD  . Dyspnea   . Emphysema   . GERD (gastroesophageal reflux disease)   . Hip pain   . Hypertension   . Peripheral vascular disease ([place])   . Prostate disorder     Past Surgical History:  Procedure Laterality Date  . ABDOMINAL SURGERY    . CATARACT EXTRACTION W/PHACO Left 11/26/2016   Procedure: CATARACT EXTRACTION PHACO AND INTRAOCULAR LENS PLACEMENT LEFT EYE;  Surgeon: Tonny Branch, MD;  Location: [facility];  Service: Ophthalmology;  Laterality: Left;  CDE: 26.53   .  CATARACT EXTRACTION W/PHACO Right 01/28/2017   Procedure: CATARACT EXTRACTION PHACO AND INTRAOCULAR LENS PLACEMENT RIGHT EYE;  Surgeon: Tonny Branch, MD;  Location: [facility];  Service: Ophthalmology;  Laterality: Right;  CDE: 6.79  . HERNIA REPAIR    . LUNG SURGERY    . MASS EXCISION Right 11/01/2017   Procedure: EXCISION CYST RIGHT KNEE;  Surgeon: Aviva Signs, MD;  Location: [facility];  Service: General;  Laterality: Right;  Marland Kitchen VASCULAR SURGERY       reports that he has been smoking. He has a 25.00 pack-year smoking history. He has never used smokeless tobacco. He reports that he does not drink alcohol or use drugs.  No Known Allergies  Family History  Problem Relation Age of Onset  . Cancer Mother   . Hypertension Mother     Prior to Admission medications   Medication Sig Start Date End Date Taking? Authorizing Provider  albuterol (PROAIR HFA) 108 (90 Base) MCG/ACT inhaler Inhale 2 puffs into the lungs every 6 (six) hours as needed for wheezing or shortness of breath. 11/01/17  Yes Aviva Signs, MD  dexamethasone (DECADRON) 4 MG tablet Take 1 tablet (4 mg total) by mouth 2 (two) times daily with a meal. 09/05/17  Yes Lily Kocher, PA-C  furosemide (LASIX) 40 MG tablet Take 1 tablet (40 mg total) by mouth daily. 04/06/17 04/06/18 Yes Sinda Du, MD  losartan (COZAAR) 25 MG tablet Take 1 tablet (25 mg total)  by mouth daily. 04/06/17  Yes Sinda Du, MD  metoprolol succinate (TOPROL-XL) 25 MG 24 hr tablet Take 0.5 tablets (12.5 mg total) by mouth daily. 04/06/17  Yes Sinda Du, MD  oxyCODONE (OXY IR/ROXICODONE) 5 MG immediate release tablet Take 5 mg by mouth at bedtime.    Yes [provider]  potassium chloride (K-DUR) 10 MEQ tablet Take 1 tablet (10 mEq total) by mouth daily. 04/06/17  Yes Sinda Du, MD  tamsulosin (FLOMAX) 0.4 MG CAPS capsule Take 0.4 mg by mouth at bedtime.    Yes [provider]  traMADol (ULTRAM) 50 MG tablet Take 1 tablet (50 mg total) by  mouth every 6 (six) hours as needed for moderate pain. 11/01/17  Yes Aviva Signs, MD  predniSONE (DELTASONE) 10 MG tablet Take 4 tablets (40 mg total) by mouth daily. 12/15/17   Fredia Sorrow, MD    Physical Exam: Vitals:   12/15/17 1130 12/15/17 1230 12/15/17 1530 12/15/17 1544  BP: (!) 147/85 (!) 162/83 (!) 149/81   Pulse: 94 94 90 83  Resp: (!) 29 (!) 24 18 20   Temp:      TempSrc:      SpO2: 100% 100% 93% 95%  Weight:      Height:        Constitutional: Mild difficulty breathing. Thin. Vitals:   12/15/17 1130 12/15/17 1230 12/15/17 1530 12/15/17 1544  BP: (!) 147/85 (!) 162/83 (!) 149/81   Pulse: 94 94 90 83  Resp: (!) 29 (!) 24 18 20   Temp:      TempSrc:      SpO2: 100% 100% 93% 95%  Weight:      Height:       Eyes: PERRL, lids and conjunctivae normal ENMT: Mucous membranes are dry. Posterior pharynx clear of any exudate or lesions.  Neck: normal, supple, no masses, no thyromegaly Respiratory: Diffuse inspiratory and expiratory rhonchi.   Cardiovascular: Regular rate and rhythm, no murmurs / rubs / gallops. No extremity edema. 2+ pedal pulses.  Abdomen: no tenderness, no masses palpated. No hepatosplenomegaly. Bowel sounds positive.  Musculoskeletal: no clubbing / cyanosis. No joint deformity upper and lower extremities. Good ROM, no contractures.  Skin: no rashes, lesions, ulcers. No induration Neurologic: CN 2-12 grossly intact. Strength 5/5 in all 4.  Psychiatric: Normal judgment and insight. Alert and oriented x 3. Normal mood.   Labs on Admission: I have personally reviewed following labs and imaging studies  CBC: Recent Labs  Lab 12/15/17 0940  WBC 11.3*  HGB 12.9*  HCT 42.3  MCV 90.8  PLT 096   Basic Metabolic Panel: Recent Labs  Lab 12/15/17 0940  NA 138  K 3.9  CL 104  CO2 26  GLUCOSE 114*  BUN 12  CREATININE 0.96  CALCIUM 8.8*   Cardiac Enzymes: Recent Labs  Lab 12/15/17 0940 12/15/17 1455  TROPONINI 0.03* 0.06*     Radiological Exams on Admission: Dg Chest 2 View  Result Date: 12/15/2017 CLINICAL DATA:  Shortness of breath for 2 hours EXAM: CHEST - 2 VIEW COMPARISON:  Chest x-rays and chest CTs since February 12, 2017 FINDINGS: Postoperative changes on the left with significant volume loss is stable since February 2019. The right lung is clear. No other interval changes. IMPRESSION: Postoperative changes on the left, stable. No other acute abnormalities. Electronically Signed   By: Dorise Bullion III M.D   On: 12/15/2017 09:33    EKG: Independently reviewed.  PVCs.  Repolarization abnormalities in lateral leads V5 to  V6.  No significant change from prior EKG.  Assessment/Plan Active Problems:   Bronchitis  COPD exacerbation- SOB, mild productive cough, rhonchi.  WBC 11.3.  Chest x-ray without acute abnormalities. S/p partial lung resection.  Current tobacco abuse. -IV Solu-Medrol 40 twice daily -Azithromycin -Duo nebs PRN, scheduled -Mucolytics, supplemental O2  Chest pain- in the setting of COPD exacerbation. Smoker. No CAD hx. Trop 0.03> 0.06.  Echo 04/2017 EF 25%.  History of noncompliance.  - Trend trop - EKG a.m - UDS - May benefit from cardiology consultation inpatient pending troponin and clinical course, but wheezing presently, he has not followed up with cardiology outpatient. - NPO mid night. -Atorvastatin, aspirin. - UDS  Combined systolic and diastolic CHF- Stable.  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  Has not followed up with cardiology. intermittently compliant with Lasix, losartan, metoprolol.  Last echo 04/2017 EF 25%, G2DD. -Resume home Lasix 40mg  in a.m. -Continue home losartan and metoprolol.    DVT prophylaxis: Lovenox Code Status: full Family Communication: None at bedside Disposition Plan: Per rounding team Consults called: CArdiology Admission status: Obs, tele   Bethena Roys MD [facility] Pager [phone] From 6PM-2AM.  Otherwise please contact night-coverage [email or web address] Password [employer]  12/15/2017, 6:15 PM

## 2017-12-15 NOTE — ED Notes (Signed)
Entered room to attempt IV access on pt.  Pt immediately becomes argumentative and demanding he be stuck in one particular area.  Informed pt he was arguing and I had not even looked for a vein yet.  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  Pt eventually allowed me to look at his arm.  When I

## 2017-12-15 NOTE — ED Notes (Signed)
Date and time results received: 12/15/17 10:31 AM  (use smartphrase ".now" to insert current time)  Test: Troponin Critical Value: 0.03  Name of Provider Notified: Zackowski  Orders Received? Or Actions Taken?: Orders Received - See Orders for details

## 2017-12-15 NOTE — ED Provider Notes (Signed)
[facility] EMERGENCY DEPARTMENT Provider Note   CSN: [id number] Arrival date & time: 12/15/17  5621     History   Chief Complaint Chief Complaint  Patient presents with  . Shortness of Breath    HPI James Cervantes is a 67 y.o. male.  Patient coming in with complaint of worsening shortness of breath over the past 2 hours.  Is had a productive cough.  Patient known to have bronchitis COPD claims that he ran out of his albuterol inhaler at home but he initially had told nursing triage that he had tried nebs and rescue inhaler at home without any relief.  His respirations were labored.     Past Medical History:  Diagnosis Date  . Anemia   . Arthritis   . Asthma   . Cardiomyopathy ([place])    a. 2014: EF 40-45%, diffuse HK, and Grade 2 DD  . Dyspnea   . Emphysema   . GERD (gastroesophageal reflux disease)   . Hip pain   . Hypertension   . Peripheral vascular disease ([place])   . Prostate disorder     Patient Active Problem List   Diagnosis Date Noted  . Bronchitis 12/15/2017  . Lipoma of extremity   . Acute exacerbation of CHF (congestive heart failure) ([place]) 04/05/2017  . Secondary cardiomyopathy ([place]) 10/14/2012  . Atypical chest pain 10/13/2012  . Chest pain 10/12/2012  . Abnormal EKG 10/12/2012  . GERD (gastroesophageal reflux disease) 10/12/2012  . UNSPECIFIED ANEMIA 05/18/2008  . ERECTILE DYSFUNCTION 09/25/2007  . UNS PULMONARY TB-CULT DX 06/19/2007  . UNDERWEIGHT 06/19/2007  . OSTEOARTHRITIS, HIP, RIGHT 02/18/2006  . HEPATITIS C 01/08/2006  . DISORDER, TOBACCO USE 01/08/2006  . MIGRAINE HEADACHE 01/08/2006  . ALLERGIC RHINITIS 01/08/2006  . GERD 01/08/2006  . BPH (benign prostatic hyperplasia) 01/08/2006  . OSTEOARTHRITIS 01/08/2006    Past Surgical History:  Procedure Laterality Date  . ABDOMINAL SURGERY    . CATARACT EXTRACTION W/PHACO Left 11/26/2016   Procedure: CATARACT EXTRACTION PHACO AND INTRAOCULAR LENS PLACEMENT LEFT EYE;  Surgeon: Tonny Branch, MD;  Location: [facility];  Service: Ophthalmology;  Laterality: Left;  CDE: 26.53   . CATARACT EXTRACTION W/PHACO Right 01/28/2017   Procedure: CATARACT EXTRACTION PHACO AND INTRAOCULAR LENS PLACEMENT RIGHT EYE;  Surgeon: Tonny Branch, MD;  Location: [facility];  Service: Ophthalmology;  Laterality: Right;  CDE: 6.79  . HERNIA REPAIR    . LUNG SURGERY    . MASS EXCISION Right 11/01/2017   Procedure: EXCISION CYST RIGHT KNEE;  Surgeon: Aviva Signs, MD;  Location: [facility];  Service: General;  Laterality: Right;  Marland Kitchen VASCULAR SURGERY          Home Medications    Prior to Admission medications   Medication Sig Start Date End Date Taking? Authorizing Provider  albuterol (PROAIR HFA) 108 (90 Base) MCG/ACT inhaler Inhale 2 puffs into the lungs every 6 (six) hours as needed for wheezing or shortness of breath. 11/01/17  Yes Aviva Signs, MD  dexamethasone (DECADRON) 4 MG tablet Take 1 tablet (4 mg total) by mouth 2 (two) times daily with a meal. 09/05/17  Yes Lily Kocher, PA-C  furosemide (LASIX) 40 MG tablet Take 1 tablet (40 mg total) by mouth daily. 04/06/17 04/06/18 Yes Sinda Du, MD  losartan (COZAAR) 25 MG tablet Take 1 tablet (25 mg total) by mouth daily. 04/06/17  Yes Sinda Du, MD  metoprolol succinate (TOPROL-XL) 25 MG 24 hr tablet Take 0.5 tablets (12.5 mg total) by mouth daily. 04/06/17  Yes Sinda Du, MD  oxyCODONE (OXY IR/ROXICODONE) 5 MG immediate release tablet Take 5 mg by mouth at bedtime.    Yes [provider]  potassium chloride (K-DUR) 10 MEQ tablet Take 1 tablet (10 mEq total) by mouth daily. 04/06/17  Yes Sinda Du, MD  tamsulosin (FLOMAX) 0.4 MG CAPS capsule Take 0.4 mg by mouth at bedtime.    Yes [provider]  traMADol (ULTRAM) 50 MG tablet Take 1 tablet (50 mg total) by mouth every 6 (six) hours as needed for moderate pain. 11/01/17  Yes Aviva Signs, MD  predniSONE (DELTASONE) 10 MG tablet Take 4 tablets (40 mg total) by mouth daily.  12/15/17   Fredia Sorrow, MD    Family History Family History  Problem Relation Age of Onset  . Cancer Mother   . Hypertension Mother     Social History Social History   Tobacco Use  . Smoking status: Current Every Day Smoker    Packs/day: 0.50    Years: 50.00    Pack years: 25.00  . Smokeless tobacco: Never Used  Substance Use Topics  . Alcohol use: No  . Drug use: No     Allergies   Patient has no known allergies.   Review of Systems Review of Systems  Constitutional: Negative for fever.  HENT: Negative for congestion.   Eyes: Negative for visual disturbance.  Respiratory: Positive for cough and shortness of breath.   Cardiovascular: Negative for chest pain.  Gastrointestinal: Negative for abdominal pain.  Genitourinary: Negative for dysuria.  Musculoskeletal: Negative for back pain.  Skin: Negative for rash.  Neurological: Negative for headaches.  Hematological: Does not bruise/bleed easily.  Psychiatric/Behavioral: Negative for confusion.     Physical Exam Updated Vital Signs BP 140/71   Pulse 76   Temp 98.1 F (36.7 C) (Oral)   Resp 18   Ht 1.676 m (5\' 6" )   Wt 59 kg   SpO2 92%   BMI 20.98 kg/m   Physical Exam Vitals signs and nursing note reviewed.  Constitutional:      General: He is not in acute distress.    Appearance: Normal appearance. He is not toxic-appearing.  HENT:     Head: Normocephalic and atraumatic.     Mouth/Throat:     Mouth: Mucous membranes are moist.  Eyes:     Extraocular Movements: Extraocular movements intact.     Conjunctiva/sclera: Conjunctivae normal.     Pupils: Pupils are equal, round, and reactive to light.  Neck:     Musculoskeletal: Neck supple.  Cardiovascular:     Rate and Rhythm: Regular rhythm. Tachycardia present.  Pulmonary:     Effort: No respiratory distress.     Breath sounds: Wheezing present.  Abdominal:     General: Bowel sounds are normal.     Palpations: Abdomen is soft.      Tenderness: There is no abdominal tenderness.  Musculoskeletal: Normal range of motion.        General: No swelling.  Skin:    General: Skin is warm.     Capillary Refill: Capillary refill takes less than 2 seconds.  Neurological:     General: No focal deficit present.     Mental Status: He is alert and oriented to person, place, and time.      ED Treatments / Results  Labs (all labs ordered are listed, but only abnormal results are displayed) Labs Reviewed  BASIC METABOLIC PANEL - Abnormal; Notable for the following components:  Result Value   Glucose, Bld 114 (*)    Calcium 8.8 (*)    All other components within normal limits  CBC - Abnormal; Notable for the following components:   WBC 11.3 (*)    Hemoglobin 12.9 (*)    All other components within normal limits  TROPONIN I - Abnormal; Notable for the following components:   Troponin I 0.03 (*)    All other components within normal limits  TROPONIN I - Abnormal; Notable for the following components:   Troponin I 0.06 (*)    All other components within normal limits  TROPONIN I  TROPONIN I  RAPID URINE DRUG SCREEN, HOSP PERFORMED    EKG EKG Interpretation  Date/Time:  Sunday December 15 2017 15:02:32 EST Ventricular Rate:  90 PR Interval:    QRS Duration: 90 QT Interval:  340 QTC Calculation: 416 R Axis:   96 Text Interpretation:  Sinus rhythm Multiple ventricular premature complexes Anterior infarct, old Nonspecific repol abnormality, diffuse leads Minimal ST elevation, lateral leads No significant change since last tracing Confirmed by Fredia Sorrow [phone]) on 12/15/2017 3:05:03 PM   Radiology Dg Chest 2 View  Result Date: 12/15/2017 CLINICAL DATA:  Shortness of breath for 2 hours EXAM: CHEST - 2 VIEW COMPARISON:  Chest x-rays and chest CTs since February 12, 2017 FINDINGS: Postoperative changes on the left with significant volume loss is stable since February 2019. The right lung is clear. No other  interval changes. IMPRESSION: Postoperative changes on the left, stable. No other acute abnormalities. Electronically Signed   By: Dorise Bullion III M.D   On: 12/15/2017 09:33    Procedures Procedures (including critical care time)  Medications Ordered in ED Medications  albuterol (PROVENTIL HFA;VENTOLIN HFA) 108 (90 Base) MCG/ACT inhaler 2 puff (2 puffs Inhalation Given 12/15/17 1510)  azithromycin (ZITHROMAX) tablet 500 mg (has no administration in time range)  albuterol (PROVENTIL) (2.5 MG/3ML) 0.083% nebulizer solution 5 mg (5 mg Nebulization Given 12/15/17 0904)  ipratropium-albuterol (DUONEB) 0.5-2.5 (3) MG/3ML nebulizer solution 3 mL (3 mLs Nebulization Given 12/15/17 1057)  predniSONE (DELTASONE) tablet 60 mg (60 mg Oral Given 12/15/17 1146)  predniSONE (DELTASONE) tablet 60 mg (60 mg Oral Given 12/15/17 1147)     Initial Impression / Assessment and Plan / ED Course  I have reviewed the triage vital signs and the nursing notes.  Pertinent labs & imaging results that were available during my care of the patient were reviewed by me and considered in my medical decision making (see chart for details).     Patient presented for shortness of breath that started about 2 hours before hand.  Patient states he has some baseline shortness of breath but it got worse.  He is worried about having pneumonia.  Patient's had bronchitis in the past.  Patient told me he was out of his albuterol treatment.  But initially upon arrival he said he tried nebs and rescue inhaler with no relief.  Patient did have wheezing.  He was treated as bronchitis or COPD exacerbation.  Received steroids received nebulizer treatment improved significantly.  However his initial troponin was 0.03.  So he had to have a delta troponin.  3 hours later that troponin was 0.08 although not a significant elevation there was a slight elevation discussed with hospitalist who will admit him for cardiac rule out.  Patient never  had any significant chest pain.  EKG did not have any acute abnormalities.  To suggest STEMI.  There was some very subtle  ST segment elevation.  Had a few PVCs.  Final Clinical Impressions(s) / ED Diagnoses   Final diagnoses:  Elevated troponin  Bronchitis    ED Discharge Orders         Ordered    predniSONE (DELTASONE) 10 MG tablet  Daily     12/15/17 1506           Fredia Sorrow, MD 12/15/17 1732

## 2017-12-16 ENCOUNTER — Encounter (HOSPITAL_COMMUNITY): Payer: Self-pay | Admitting: Family Medicine

## 2017-12-16 DIAGNOSIS — J441 Chronic obstructive pulmonary disease with (acute) exacerbation: Secondary | ICD-10-CM | POA: Diagnosis not present

## 2017-12-16 DIAGNOSIS — J40 Bronchitis, not specified as acute or chronic: Secondary | ICD-10-CM | POA: Diagnosis not present

## 2017-12-16 DIAGNOSIS — Z9119 Patient's noncompliance with other medical treatment and regimen: Secondary | ICD-10-CM | POA: Diagnosis not present

## 2017-12-16 DIAGNOSIS — Z91199 Patient's noncompliance with other medical treatment and regimen due to unspecified reason: Secondary | ICD-10-CM

## 2017-12-16 DIAGNOSIS — R778 Other specified abnormalities of plasma proteins: Secondary | ICD-10-CM | POA: Diagnosis present

## 2017-12-16 DIAGNOSIS — R7989 Other specified abnormal findings of blood chemistry: Secondary | ICD-10-CM | POA: Diagnosis not present

## 2017-12-16 DIAGNOSIS — F17200 Nicotine dependence, unspecified, uncomplicated: Secondary | ICD-10-CM

## 2017-12-16 LAB — LIPID PANEL
CHOL/HDL RATIO: 1.7 ratio
CHOLESTEROL: 118 mg/dL (ref 0–200)
HDL: 71 mg/dL (ref >40)
LDL Cholesterol: 41 mg/dL (ref 0–99)
Triglycerides: 31 mg/dL (ref <150)
VLDL: 6 mg/dL (ref 0–40)

## 2017-12-16 LAB — TROPONIN I
Troponin I: 0.05 ng/mL (ref <0.03)
Troponin I: 0.05 ng/mL (ref <0.03)

## 2017-12-16 MED ORDER — OMEPRAZOLE 20 MG PO TBEC: 20.0000 mg | DELAYED_RELEASE_TABLET | Freq: Every day | ORAL | 0 refills | Status: DC

## 2017-12-16 MED ORDER — DM-GUAIFENESIN ER 30-600 MG PO TB12: 2.0000 | ORAL_TABLET | Freq: Two times a day (BID) | ORAL | 0 refills | Status: AC

## 2017-12-16 MED ORDER — ATORVASTATIN CALCIUM 40 MG PO TABS: 40.0000 mg | ORAL_TABLET | Freq: Every day | ORAL | 0 refills | Status: DC

## 2017-12-16 MED ORDER — PREDNISONE 20 MG PO TABS: 40.0000 mg | ORAL_TABLET | Freq: Every day | ORAL | 0 refills | Status: AC

## 2017-12-16 MED ORDER — ALBUTEROL SULFATE HFA 108 (90 BASE) MCG/ACT IN AERS: 2.0000 | INHALATION_SPRAY | RESPIRATORY_TRACT | 1 refills | Status: AC | PRN

## 2017-12-16 MED ORDER — AZITHROMYCIN 500 MG PO TABS: 500.0000 mg | ORAL_TABLET | Freq: Every day | ORAL | 0 refills | Status: DC

## 2017-12-16 MED ORDER — ASPIRIN 325 MG PO TABS: 325.0000 mg | ORAL_TABLET | Freq: Every day | ORAL | Status: DC

## 2017-12-16 NOTE — Plan of Care (Signed)

## 2017-12-16 NOTE — Discharge Summary (Signed)
Physician Discharge Summary  James Cervantes ZOX:[id number] DOB: 1950/12/10 DOA: 12/15/2017  PCP: Lucia Gaskins, MD Cardiologist: Dr. Domenic Polite  Admit date: 12/15/2017 Discharge date: 12/16/2017  Admitted From: Home  Disposition: Home   Recommendations for Outpatient Follow-up:  1. Follow up with PCP in 3 days  2. Follow up with cardiologist in 2 weeks.   Discharge Condition: STABLE   CODE STATUS: FULL    Brief Hospitalization Summary: Please see all hospital notes, images, labs for full details of the hospitalization.  HPI: James Cervantes is a 67 y.o. male with medical history significant for CHF, hepatitis C, BPH, COPD, who presented to the ED with complaints of shortness of breath that started this morning.  Associated with wheezing and cough mildly productive of yellowish sputum.  He smokes 5 cigarettes/day.  Patient also reports left-sided , pressure-like chest pain that started after onset of difficulty breathing.  He was sitting down when chest pain started, and persisted through he got to the ED.  And then spontaneously subsided.  No leg swelling or pain.  No fevers or chills.  Patient is intermittently compliant with 40 mg Lasix, and antihypertensives.  ED Course: Initial O2 sats 88% improved to >93% on room air, intermittent tachypnea to 30.  Two-view chest x-ray-stable postop changes of left lung volume loss, No acute abnormality.  WBC 11.3.  Troponin 0.03 >> 0.06.  EKG PVCs unchanged from prior.  Hospitalist to admit for elevated troponin and COPD exacerbation.  The patient was admitted to the hospital for observation for acute COPD exacerbation.  He continues to smoke cigarettes.  He was not volume overloaded and his chest x-ray did not show evidence of pulmonary edema.  He does have a cardiomyopathy and he has been noncompliant with following up with outpatient cardiology.  He does not have any chest pain symptoms.  His troponins have remained flat at 0.03-0.05.  He has no acute  ST changes on his EKG.  The patient was started on antibiotics, steroids and nebulizer treatments with improvement in symptoms.  The patient says that he is going home and he is not going to stay in the hospital for another day for further treatment.  I advised the patient to please stop smoking cigarettes and to follow-up with his PCP and to follow-up with cardiology.  He has been noncompliant with following up and taking medications as advised.  He is high risk for readmission or further complications because of this.  He is not willing to stay in the hospital any longer for treatment and will discharge him home today.  I have strongly encouraged him to follow-up with his PCP in 3 days and follow-up with cardiology in the next 1 to 2 weeks.  He verbalized understanding.  He will be discharged home with prednisone, azithromycin and Mucinex.  He has an albuterol inhaler to use for rescue.  He says that his shortness of breath is resolved now.  He has no chest pain.  He was offered to stay in the hospital for little bit longer for observation but the patient declined and said he is going home.  Discharge Diagnoses:  Principal Problem:   COPD with acute exacerbation ([place]) Active Problems:   DISORDER, TOBACCO USE   Noncompliance   Elevated troponin   Bronchitis  Discharge Instructions: Discharge Instructions    (HEART FAILURE PATIENTS) Call MD:  Anytime you have any of the following symptoms: 1) 3 pound weight gain in 24 hours or 5 pounds in 1 week  2) shortness of breath, with or without a dry hacking cough 3) swelling in the hands, feet or stomach 4) if you have to sleep on extra pillows at night in order to breathe.   Complete by:  As directed    Call MD for:  difficulty breathing, headache or visual disturbances   Complete by:  As directed    Call MD for:  extreme fatigue   Complete by:  As directed    Call MD for:  persistant dizziness or light-headedness   Complete by:  As directed    Call MD  for:  persistant nausea and vomiting   Complete by:  As directed    Diet - low sodium heart healthy   Complete by:  As directed    Increase activity slowly   Complete by:  As directed      Allergies as of 12/16/2017   No Known Allergies     Medication List    STOP taking these medications   dexamethasone 4 MG tablet Commonly known as:  DECADRON     TAKE these medications   albuterol 108 (90 Base) MCG/ACT inhaler Commonly known as:  PROAIR HFA Inhale 2 puffs into the lungs every 4 (four) hours as needed for wheezing or shortness of breath. What changed:  when to take this   aspirin 325 MG tablet Take 1 tablet (325 mg total) by mouth daily. Start taking on:  December 17, 2017   atorvastatin 40 MG tablet Commonly known as:  LIPITOR Take 1 tablet (40 mg total) by mouth daily at 6 PM.   azithromycin 500 MG tablet Commonly known as:  ZITHROMAX Take 1 tablet (500 mg total) by mouth daily. Start taking on:  December 17, 2017   dextromethorphan-guaiFENesin 30-600 MG 12hr tablet Commonly known as:  MUCINEX DM Take 2 tablets by mouth 2 (two) times daily for 5 days.   furosemide 40 MG tablet Commonly known as:  LASIX Take 1 tablet (40 mg total) by mouth daily.   losartan 25 MG tablet Commonly known as:  COZAAR Take 1 tablet (25 mg total) by mouth daily.   metoprolol succinate 25 MG 24 hr tablet Commonly known as:  TOPROL-XL Take 0.5 tablets (12.5 mg total) by mouth daily.   Omeprazole 20 MG Tbec Take 1 tablet (20 mg total) by mouth daily.   oxyCODONE 5 MG immediate release tablet Commonly known as:  Oxy IR/ROXICODONE Take 5 mg by mouth at bedtime.   potassium chloride 10 MEQ tablet Commonly known as:  K-DUR Take 1 tablet (10 mEq total) by mouth daily.   predniSONE 20 MG tablet Commonly known as:  DELTASONE Take 2 tablets (40 mg total) by mouth daily with breakfast for 5 days. Start taking on:  December 17, 2017   tamsulosin 0.4 MG Caps capsule Commonly known  as:  FLOMAX Take 0.4 mg by mouth at bedtime.   traMADol 50 MG tablet Commonly known as:  ULTRAM Take 1 tablet (50 mg total) by mouth every 6 (six) hours as needed for moderate pain.      Follow-up Information    Lucia Gaskins, MD. Schedule an appointment as soon as possible for a visit in 3 day(s).   Specialty:  Internal Medicine Why:  Hospital Follow Up  Contact information: [place] [address] [phone]        Satira Sark, MD. Schedule an appointment as soon as possible for a visit in 2 week(s).   Specialty:  Cardiology Why:  Hospital Follow Up  Contact information: [place] [address] [phone]          No Known Allergies Allergies as of 12/16/2017   No Known Allergies     Medication List    STOP taking these medications   dexamethasone 4 MG tablet Commonly known as:  DECADRON     TAKE these medications   albuterol 108 (90 Base) MCG/ACT inhaler Commonly known as:  PROAIR HFA Inhale 2 puffs into the lungs every 4 (four) hours as needed for wheezing or shortness of breath. What changed:  when to take this   aspirin 325 MG tablet Take 1 tablet (325 mg total) by mouth daily. Start taking on:  December 17, 2017   atorvastatin 40 MG tablet Commonly known as:  LIPITOR Take 1 tablet (40 mg total) by mouth daily at 6 PM.   azithromycin 500 MG tablet Commonly known as:  ZITHROMAX Take 1 tablet (500 mg total) by mouth daily. Start taking on:  December 17, 2017   dextromethorphan-guaiFENesin 30-600 MG 12hr tablet Commonly known as:  MUCINEX DM Take 2 tablets by mouth 2 (two) times daily for 5 days.   furosemide 40 MG tablet Commonly known as:  LASIX Take 1 tablet (40 mg total) by mouth daily.   losartan 25 MG tablet Commonly known as:  COZAAR Take 1 tablet (25 mg total) by mouth daily.   metoprolol succinate 25 MG 24 hr tablet Commonly known as:  TOPROL-XL Take 0.5 tablets (12.5 mg total) by  mouth daily.   Omeprazole 20 MG Tbec Take 1 tablet (20 mg total) by mouth daily.   oxyCODONE 5 MG immediate release tablet Commonly known as:  Oxy IR/ROXICODONE Take 5 mg by mouth at bedtime.   potassium chloride 10 MEQ tablet Commonly known as:  K-DUR Take 1 tablet (10 mEq total) by mouth daily.   predniSONE 20 MG tablet Commonly known as:  DELTASONE Take 2 tablets (40 mg total) by mouth daily with breakfast for 5 days. Start taking on:  December 17, 2017   tamsulosin 0.4 MG Caps capsule Commonly known as:  FLOMAX Take 0.4 mg by mouth at bedtime.   traMADol 50 MG tablet Commonly known as:  ULTRAM Take 1 tablet (50 mg total) by mouth every 6 (six) hours as needed for moderate pain.       Procedures/Studies: Dg Chest 2 View  Result Date: 12/15/2017 CLINICAL DATA:  Shortness of breath for 2 hours EXAM: CHEST - 2 VIEW COMPARISON:  Chest x-rays and chest CTs since February 12, 2017 FINDINGS: Postoperative changes on the left with significant volume loss is stable since February 2019. The right lung is clear. No other interval changes. IMPRESSION: Postoperative changes on the left, stable. No other acute abnormalities. Electronically Signed   By: Dorise Bullion III M.D   On: 12/15/2017 09:33      Subjective: Patient says that he is going home today and he is not going to stay any longer for treatment he says he feels better and does not need to be in the hospital.  Discharge Exam: Vitals:   12/16/17 0508 12/16/17 0740  BP:    Pulse:    Resp:    Temp:    SpO2: 96% 95%   Vitals:   12/15/17 2123 12/16/17 0455 12/16/17 0508 12/16/17 0740  BP:  (!) 141/77    Pulse:  75    Resp:  16    Temp:  97.8  F (36.6 C)    TempSrc:  Oral    SpO2: 96% 96% 96% 95%  Weight:      Height:        General: Pt is very thin, alert, awake, not in acute distress Cardiovascular: RRR, S1/S2 +, no rubs, no gallops Respiratory: CTA bilaterally, no wheezing, no rhonchi Abdominal: Soft,  NT, ND, bowel sounds + Extremities: no edema, no cyanosis   The results of significant diagnostics from this hospitalization (including imaging, microbiology, ancillary and laboratory) are listed below for reference.     Microbiology: No results found for this or any previous visit (from the past 240 hour(s)).   Labs: BNP (last 3 results) Recent Labs    04/04/17 1909  BNP 5,035.4*   Basic Metabolic Panel: Recent Labs  Lab 12/15/17 0940  NA 138  K 3.9  CL 104  CO2 26  GLUCOSE 114*  BUN 12  CREATININE 0.96  CALCIUM 8.8*   Liver Function Tests: No results for input(s): AST, ALT, ALKPHOS, BILITOT, PROT, ALBUMIN in the last 168 hours. No results for input(s): LIPASE, AMYLASE in the last 168 hours. No results for input(s): AMMONIA in the last 168 hours. CBC: Recent Labs  Lab 12/15/17 0940  WBC 11.3*  HGB 12.9*  HCT 42.3  MCV 90.8  PLT 282   Cardiac Enzymes: Recent Labs  Lab 12/15/17 0940 12/15/17 1455 12/15/17 2259 12/16/17 0504  TROPONINI 0.03* 0.06* 0.05* 0.05*   BNP: Invalid input(s): POCBNP CBG: No results for input(s): GLUCAP in the last 168 hours. D-Dimer No results for input(s): DDIMER in the last 72 hours. Hgb A1c No results for input(s): HGBA1C in the last 72 hours. Lipid Profile Recent Labs    12/16/17 0504  CHOL 118  HDL 71  LDLCALC 41  TRIG 31  CHOLHDL 1.7   Thyroid function studies No results for input(s): TSH, T4TOTAL, T3FREE, THYROIDAB in the last 72 hours.  Invalid input(s): FREET3 Anemia work up No results for input(s): VITAMINB12, FOLATE, FERRITIN, TIBC, IRON, RETICCTPCT in the last 72 hours. Urinalysis    Component Value Date/Time   COLORURINE YELLOW 05/10/2014 1436   APPEARANCEUR CLOUDY (A) 05/10/2014 1436   LABSPEC 1.025 05/10/2014 1436   PHURINE 6.5 05/10/2014 1436   GLUCOSEU NEGATIVE 05/10/2014 1436   HGBUR TRACE (A) 05/10/2014 1436   HGBUR negative 10/23/2007 1035   BILIRUBINUR NEGATIVE 05/10/2014 1436    KETONESUR NEGATIVE 05/10/2014 1436   PROTEINUR NEGATIVE 05/10/2014 1436   UROBILINOGEN 0.2 05/10/2014 1436   NITRITE NEGATIVE 05/10/2014 1436   LEUKOCYTESUR SMALL (A) 05/10/2014 1436   Sepsis Labs Invalid input(s): PROCALCITONIN,  WBC,  LACTICIDVEN Microbiology No results found for this or any previous visit (from the past 240 hour(s)).  Time coordinating discharge:   SIGNED:  Irwin Brakeman, MD  [facility] 12/16/2017, 9:08 AM Pager [phone]  If 7PM-7AM, please contact night-coverage [email or web address] Password [email or web address]

## 2017-12-16 NOTE — Discharge Instructions (Signed)
Seek medical care or return to ER if symptoms come back, worsen or new problems develop.  Please follow-up with cardiologist for recheck.  Please make an appointment.  Please stop smoking cigarettes.       Upper Respiratory Infection, Adult Most upper respiratory infections (URIs) are caused by a virus. A URI affects the nose, throat, and upper air passages. The most common type of URI is often called "the common cold." Follow these instructions at home:  Take medicines only as told by your doctor.  Gargle warm saltwater or take cough drops to comfort your throat as told by your doctor.  Use a warm mist humidifier or inhale steam from a shower to increase air moisture. This may make it easier to breathe.  Drink enough fluid to keep your pee (urine) clear or pale yellow.  Eat soups and other clear broths.  Have a healthy diet.  Rest as needed.  Go back to work when your fever is gone or your doctor says it is okay. ? You may need to stay home longer to avoid giving your URI to others. ? You can also wear a face mask and wash your hands often to prevent spread of the virus.  Use your inhaler more if you have asthma.  Do not use any tobacco products, including cigarettes, chewing tobacco, or electronic cigarettes. If you need help quitting, ask your doctor. Contact a doctor if:  You are getting worse, not better.  Your symptoms are not helped by medicine.  You have chills.  You are getting more short of breath.  You have brown or red mucus.  You have yellow or brown discharge from your nose.  You have pain in your face, especially when you bend forward.  You have a fever.  You have puffy (swollen) neck glands.  You have pain while swallowing.  You have white areas in the back of your throat. Get help right away if:  You have very bad or constant: ? Headache. ? Ear pain. ? Pain in your forehead, behind your eyes, and over your cheekbones (sinus  pain). ? Chest pain.  You have long-lasting (chronic) lung disease and any of the following: ? Wheezing. ? Long-lasting cough. ? Coughing up blood. ? A change in your usual mucus.  You have a stiff neck.  You have changes in your: ? Vision. ? Hearing. ? Thinking. ? Mood. This information is not intended to replace advice given to you by your health care provider. Make sure you discuss any questions you have with your health care provider. Document Released: 06/06/2007 Document Revised: 08/21/2015 Document Reviewed: 03/25/2013 Elsevier Interactive Patient Education  2018 [employer].

## 2018-01-09 ENCOUNTER — Ambulatory Visit: Payer: Medicare Other | Admitting: Student

## 2018-01-09 NOTE — Progress Notes (Deleted)
Cardiology Office Note    Date:  01/09/2018   ID:  James Cervantes, DOB [birth year], MRN [id number]  PCP:  Lucia Gaskins, MD  Cardiologist: No previous outpatient follow-up; Evaluated by Dr. Domenic Polite during admission in 04/2017  No chief complaint on file.   History of Present Illness:    James Cervantes is a 68 y.o. male with past medical history of chronic combined systolic and diastolic CHF (EF reduced to 40 to 45% by echo in 2014, at 20 to 25% by repeat imaging in 04/2017), HTN, COPD, and continued tobacco use who presents to the office today for hospital follow-up.  He was last examined by Dr. Domenic Polite during an admission in 04/2017 for acute on chronic CHF.  He diuresed well with IV Lasix and was started on PO Lasix 40 mg daily, losartan 25 mg daily, and Toprol-XL 12.5 mg daily at the time of discharge.  He was not felt to be medically ready for discharge but was refusing to remain in the hospital therefore he was discharged by the Hospitalist rather than having him sign out AMA.  He was most recently admitted to [facility] from 12/15/2017 to 12/16/2017 for evaluation of worsening dyspnea, wheezing, and a productive cough thought to be most consistent with a COPD exacerbation.  Opponent values remained flat, peaking at 0.06.  EKG showed no acute ischemic changes.  He was started on antibiotic and steroid therapy and refused to remain in the hospital for more than 1 day, therefore he was discharged home with close follow-up with his PCP and Cardiology recommended.  Past Medical History:  Diagnosis Date  . Anemia   . Arthritis   . Asthma   . Cardiomyopathy ([place])    a. 2014: EF 40-45%, diffuse HK, and Grade 2 DD  . Dyspnea   . Emphysema   . GERD (gastroesophageal reflux disease)   . Hip pain   . Hypertension   . Peripheral vascular disease ([place])   . Prostate disorder     Past Surgical History:  Procedure Laterality Date  . ABDOMINAL SURGERY    . CATARACT EXTRACTION W/PHACO  Left 11/26/2016   Procedure: CATARACT EXTRACTION PHACO AND INTRAOCULAR LENS PLACEMENT LEFT EYE;  Surgeon: Tonny Branch, MD;  Location: [facility];  Service: Ophthalmology;  Laterality: Left;  CDE: 26.53   . CATARACT EXTRACTION W/PHACO Right 01/28/2017   Procedure: CATARACT EXTRACTION PHACO AND INTRAOCULAR LENS PLACEMENT RIGHT EYE;  Surgeon: Tonny Branch, MD;  Location: [facility];  Service: Ophthalmology;  Laterality: Right;  CDE: 6.79  . HERNIA REPAIR    . LUNG SURGERY    . MASS EXCISION Right 11/01/2017   Procedure: EXCISION CYST RIGHT KNEE;  Surgeon: Aviva Signs, MD;  Location: [facility];  Service: General;  Laterality: Right;  Marland Kitchen VASCULAR SURGERY      Current Medications: Outpatient Medications Prior to Visit  Medication Sig Dispense Refill  . albuterol (PROAIR HFA) 108 (90 Base) MCG/ACT inhaler Inhale 2 puffs into the lungs every 4 (four) hours as needed for wheezing or shortness of breath. 8 g 1  . aspirin 325 MG tablet Take 1 tablet (325 mg total) by mouth daily.    Marland Kitchen atorvastatin (LIPITOR) 40 MG tablet Take 1 tablet (40 mg total) by mouth daily at 6 PM. 30 tablet 0  . azithromycin (ZITHROMAX) 500 MG tablet Take 1 tablet (500 mg total) by mouth daily. 3 tablet 0  . furosemide (LASIX) 40 MG tablet Take 1 tablet (40 mg total) by  mouth daily. 30 tablet 11  . losartan (COZAAR) 25 MG tablet Take 1 tablet (25 mg total) by mouth daily. 30 tablet 12  . metoprolol succinate (TOPROL-XL) 25 MG 24 hr tablet Take 0.5 tablets (12.5 mg total) by mouth daily. 30 tablet 12  . Omeprazole 20 MG TBEC Take 1 tablet (20 mg total) by mouth daily. 30 each 0  . oxyCODONE (OXY IR/ROXICODONE) 5 MG immediate release tablet Take 5 mg by mouth at bedtime.     . potassium chloride (K-DUR) 10 MEQ tablet Take 1 tablet (10 mEq total) by mouth daily. 30 tablet 12  . tamsulosin (FLOMAX) 0.4 MG CAPS capsule Take 0.4 mg by mouth at bedtime.     . traMADol (ULTRAM) 50 MG tablet Take 1 tablet (50 mg total) by mouth every 6 (six) hours  as needed for moderate pain. 25 tablet 0   No facility-administered medications prior to visit.      Allergies:   Patient has no known allergies.   Social History   Socioeconomic History  . Marital status: Legally Separated    Spouse name: Not on file  . Number of children: Not on file  . Years of education: Not on file  . Highest education level: Not on file  Occupational History  . Not on file  Social Needs  . Financial resource strain: Not on file  . Food insecurity:    Worry: Not on file    Inability: Not on file  . Transportation needs:    Medical: Not on file    Non-medical: Not on file  Tobacco Use  . Smoking status: Current Every Day Smoker    Packs/day: 0.50    Years: 50.00    Pack years: 25.00  . Smokeless tobacco: Never Used  Substance and Sexual Activity  . Alcohol use: No  . Drug use: No  . Sexual activity: Yes    Birth control/protection: Condom  Lifestyle  . Physical activity:    Days per week: Not on file    Minutes per session: Not on file  . Stress: Not on file  Relationships  . Social connections:    Talks on phone: Not on file    Gets together: Not on file    Attends religious service: Not on file    Active member of club or organization: Not on file    Attends meetings of clubs or organizations: Not on file    Relationship status: Not on file  Other Topics Concern  . Not on file  Social History Narrative  . Not on file     Family History:  The patient's ***family history includes Cancer in his mother; Hypertension in his mother.   Review of Systems:   Please see the history of present illness.     General:  No chills, fever, night sweats or weight changes.  Cardiovascular:  No chest pain, dyspnea on exertion, edema, orthopnea, palpitations, paroxysmal nocturnal dyspnea. Dermatological: No rash, lesions/masses Respiratory: No cough, dyspnea Urologic: No hematuria, dysuria Abdominal:   No nausea, vomiting, diarrhea, bright red blood  per rectum, melena, or hematemesis Neurologic:  No visual changes, wkns, changes in mental status. All other systems reviewed and are otherwise negative except as noted above.   Physical Exam:    VS:  There were no vitals taken for this visit.   General: Well developed, well nourished,male appearing in no acute distress. Head: Normocephalic, atraumatic, sclera non-icteric, no xanthomas, nares are without discharge.  Neck: No carotid  bruits. JVD not elevated.  Lungs: Respirations regular and unlabored, without wheezes or rales.  Heart: ***Regular rate and rhythm. No S3 or S4.  No murmur, no rubs, or gallops appreciated. Abdomen: Soft, non-tender, non-distended with normoactive bowel sounds. No hepatomegaly. No rebound/guarding. No obvious abdominal masses. Msk:  Strength and tone appear normal for age. No joint deformities or effusions. Extremities: No clubbing or cyanosis. No edema.  Distal pedal pulses are 2+ bilaterally. Neuro: Alert and oriented X 3. Moves all extremities spontaneously. No focal deficits noted. Psych:  Responds to questions appropriately with a normal affect. Skin: No rashes or lesions noted  Wt Readings from Last 3 Encounters:  12/15/17 113 lb 12.1 oz (51.6 kg)  11/01/17 115 lb (52.2 kg)  10/01/17 115 lb (52.2 kg)        Studies/Labs Reviewed:   EKG:  EKG is*** ordered today.  The ekg ordered today demonstrates ***  Recent Labs: 04/04/2017: B Natriuretic Peptide 3,142.0 04/05/2017: ALT 36 12/15/2017: BUN 12; Creatinine, Ser 0.96; Hemoglobin 12.9; Platelets 282; Potassium 3.9; Sodium 138   Lipid Panel    Component Value Date/Time   CHOL 118 12/16/2017 0504   TRIG 31 12/16/2017 0504   HDL 71 12/16/2017 0504   CHOLHDL 1.7 12/16/2017 0504   VLDL 6 12/16/2017 0504   LDLCALC 41 12/16/2017 0504    Additional studies/ records that were reviewed today include:   Echocardiogram: 04/2017 Study Conclusions  - Left ventricle: The cavity size was mildly  dilated. Wall   thickness was increased in a pattern of mild LVH. Systolic   function was severely reduced. The estimated ejection fraction   was in the range of 20% to 25%. Diffuse hypokinesis with regional   variation. Features are consistent with a pseudonormal left   ventricular filling pattern, with concomitant abnormal relaxation   and increased filling pressure (grade 2 diastolic dysfunction). - Aortic valve: There was mild regurgitation. - Mitral valve: Mildly thickened leaflets . There was mild   regurgitation. - Left atrium: The atrium was at the upper limits of normal in   size. - Right ventricle: Systolic function was moderately reduced. - Right atrium: Central venous pressure (est): 3 mm Hg. - Tricuspid valve: There was mild regurgitation. - Pulmonic valve: There was mild regurgitation. - Pulmonary arteries: Systolic pressure was moderately increased.   PA peak pressure: 42 mm Hg (S). - Pericardium, extracardiac: A small pericardial effusion was   identified posterior to the heart.  Assessment:    No diagnosis found.   Plan:   In order of problems listed above:  1. ***    Medication Adjustments/Labs and Tests Ordered: Current medicines are reviewed at length with the patient today.  Concerns regarding medicines are outlined above.  Medication changes, Labs and Tests ordered today are listed in the Patient Instructions below. There are no Patient Instructions on file for this visit.   Signed, James Heritage, PA-C  01/09/2018 7:22 AM    [place] [address] [place], [place] [address] Phone: [phone] Fax: [phone]

## 2018-01-10 ENCOUNTER — Encounter: Payer: Self-pay | Admitting: Student

## 2018-02-04 NOTE — Congregational Nurse Program (Signed)
Seen at the [facility]. Stated he has been getting along well. Taking his meds as prescribed, e xcept he can't stop smoking. Discussed the dangers of smoking and the health problems it causes. Voiced understanding. B P 133/71, P [place], [place] [facility], [phone]

## 2018-03-10 NOTE — Congregational Nurse Program (Signed)
  Dept: [phone]   Congregational Nurse Program Note  Date of Encounter: 01/03/2018  Past Medical History: Past Medical History:  Diagnosis Date  . Anemia   . Arthritis   . Asthma   . Cardiomyopathy ([place])    a. 2014: EF 40-45%, diffuse HK, and Grade 2 DD  . Dyspnea   . Emphysema   . GERD (gastroesophageal reflux disease)   . Hip pain   . Hypertension   . Peripheral vascular disease ([place])   . Prostate disorder     Encounter Details:S Seen at the [facility] lunch program.No complaints or concerns BP 133/71 P [address] RN, [place] [facility], [phone]

## 2018-03-10 NOTE — Congregational Nurse Program (Signed)
  Dept: [phone]   Congregational Nurse Program Note  Date of Encounter: 03/10/2018  Past Medical History: Past Medical History:  Diagnosis Date  . Anemia   . Arthritis   . Asthma   . Cardiomyopathy ([place])    a. 2014: EF 40-45%, diffuse HK, and Grade 2 DD  . Dyspnea   . Emphysema   . GERD (gastroesophageal reflux disease)   . Hip pain   . Hypertension   . Peripheral vascular disease ([place])   . Prostate disorder     Encounter Details:

## 2018-03-28 NOTE — Congregational Nurse Program (Signed)
No complaints. Stated he see his MD on regular basis and takes meds  As prescribed. No complaints or concerns.B P 150/70 P 80 Erma Heritage RN [phone], [facility]
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
## 2018-08-19 ENCOUNTER — Ambulatory Visit (INDEPENDENT_AMBULATORY_CARE_PROVIDER_SITE_OTHER): Payer: Medicare Other | Admitting: Urology

## 2018-08-19 DIAGNOSIS — R350 Frequency of micturition: Secondary | ICD-10-CM | POA: Diagnosis not present

## 2018-08-19 DIAGNOSIS — N401 Enlarged prostate with lower urinary tract symptoms: Secondary | ICD-10-CM

## 2018-08-19 DIAGNOSIS — N5201 Erectile dysfunction due to arterial insufficiency: Secondary | ICD-10-CM | POA: Diagnosis not present

## 2018-09-01 NOTE — Congregational Nurse Program (Signed)
  Dept: [phone]   Congregational Nurse Program Note  Date of Encounter: 09/01/2018  Past Medical History: Past Medical History:  Diagnosis Date  . Anemia   . Arthritis   . Asthma   . Cardiomyopathy ([place])    a. 2014: EF 40-45%, diffuse HK, and Grade 2 DD  . Dyspnea   . Emphysema   . GERD (gastroesophageal reflux disease)   . Hip pain   . Hypertension   . Peripheral vascular disease ([place])   . Prostate disorder     Encounter Details: CNP Questionnaire - 09/01/18 1100      Questionnaire   Patient Status  Not Applicable    Race  Black or African American    Location Patient Served At  [facility] [facility];Medicare    Uninsured  Not Applicable    Food  Yes, have food insecurities    Housing/Utilities  Yes, have permanent housing    Transportation  No transportation needs   Drives   Interpersonal Safety  Yes, feel physically and emotionally safe where you currently live    Medication  No medication insecurities   Gets Medication from [facility] Provider  Yes   James Cervantes   Referrals  Not Applicable    ED Visit Averted  Not Applicable    Life-Saving Intervention Made  Not Applicable      [id number] Checking on blood pressure, take a pill Q AM . Walks, hx of asthma. Blood pressure 120/78 Pulse 74. Sees James Cervantes regularly. James Cervantes [phone].

## 2018-10-20 NOTE — Congregational Nurse Program (Signed)
  Dept: [phone]   Congregational Nurse Program Note  Date of Encounter: 10/20/2018  Past Medical History: Past Medical History:  Diagnosis Date  . Anemia   . Arthritis   . Asthma   . Cardiomyopathy ([place])    a. 2014: EF 40-45%, diffuse HK, and Grade 2 DD  . Dyspnea   . Emphysema   . GERD (gastroesophageal reflux disease)   . Hip pain   . Hypertension   . Peripheral vascular disease ([place])   . Prostate disorder     Encounter Details: CNP Questionnaire - 10/20/18 1110      Questionnaire   Patient Status  Not Applicable    Race  Black or African American    Location Patient Served At  [employer];Medicare    Uninsured  Not Applicable    Food  Yes, have food insecurities    Housing/Utilities  Yes, have permanent housing    Transportation  No transportation needs    Interpersonal Safety  Yes, feel physically and emotionally safe where you currently live    Medication  No medication insecurities    Medical Provider  Yes    Referrals  Not Applicable    ED Visit Averted  Not Applicable    Life-Saving Intervention Made  Not Applicable     [id number] Requesting B/P check left home without taking mediication.  Blood Pressure 129/80 Pulse 80. Advised to take medication as directed by doctor.  Marko Plume MSN [phone].

## 2018-10-27 NOTE — Congregational Nurse Program (Signed)
  Dept: [phone]   Congregational Nurse Program Note  Date of Encounter: 10/27/2018  Past Medical History: Past Medical History:  Diagnosis Date  . Anemia   . Arthritis   . Asthma   . Cardiomyopathy ([place])    a. 2014: EF 40-45%, diffuse HK, and Grade 2 DD  . Dyspnea   . Emphysema   . GERD (gastroesophageal reflux disease)   . Hip pain   . Hypertension   . Peripheral vascular disease ([place])   . Prostate disorder     Encounter Details: CNP Questionnaire - 10/27/18 1105      Questionnaire   Patient Status  Not Applicable    Race  Black or African American    Location Patient Served At  [facility]    Uninsured  Not Applicable    Food  Yes, have food insecurities    Housing/Utilities  Yes, have permanent housing    Transportation  No transportation needs    Medication  No medication insecurities    Medical Provider  Yes    Referrals  Not Applicable    ED Visit Averted  Not Applicable    Life-Saving Intervention Made  Not Applicable     [id number] For blood pressure check B/P 134/88 Pulse 85. Advised to take medication as prescribed.  Theron Arista RN [phone]

## 2018-11-18 ENCOUNTER — Ambulatory Visit: Payer: Medicare Other | Admitting: Urology

## 2019-01-06 ENCOUNTER — Ambulatory Visit: Payer: Medicare Other | Admitting: Urology

## 2019-01-27 ENCOUNTER — Ambulatory Visit: Payer: Medicare Other | Admitting: Urology

## 2019-03-03 ENCOUNTER — Ambulatory Visit: Payer: Medicare Other | Attending: Internal Medicine

## 2019-03-03 DIAGNOSIS — Z23 Encounter for immunization: Secondary | ICD-10-CM | POA: Insufficient documentation

## 2019-03-03 NOTE — Progress Notes (Signed)
   Covid-19 Vaccination Clinic  Name:  James Cervantes    MRN: [id number] DOB: [birth year]  03/03/2019  James Cervantes was observed post Covid-19 immunization for 15 minutes without incident. He was provided with Vaccine Information Sheet and instruction to access the V-Safe system.   James Cervantes was instructed to call 911 with any severe reactions post vaccine: Marland Kitchen Difficulty breathing  . Swelling of face and throat  . A fast heartbeat  . A bad rash all over body  . Dizziness and weakness   Immunizations Administered    Name Date Dose VIS Date Route   Moderna COVID-19 Vaccine 03/03/2019 12:09 PM 0.5 mL 12/02/2018 Intramuscular   Manufacturer: Moderna   Lot: [id number]   [place]PO:[id number]

## 2019-03-31 ENCOUNTER — Emergency Department (HOSPITAL_COMMUNITY): Payer: Medicare Other

## 2019-03-31 ENCOUNTER — Other Ambulatory Visit: Payer: Self-pay

## 2019-03-31 ENCOUNTER — Encounter (HOSPITAL_COMMUNITY): Payer: Self-pay | Admitting: *Deleted

## 2019-03-31 ENCOUNTER — Emergency Department (HOSPITAL_COMMUNITY)
Admission: EM | Admit: 2019-03-31 | Discharge: 2019-03-31 | Disposition: A | Discharge status: Home / Self Care | Payer: Medicare Other | Attending: Emergency Medicine | Admitting: Emergency Medicine

## 2019-03-31 ENCOUNTER — Ambulatory Visit: Payer: Medicare Other | Attending: Internal Medicine

## 2019-03-31 DIAGNOSIS — M545 Low back pain: Secondary | ICD-10-CM | POA: Diagnosis not present

## 2019-03-31 DIAGNOSIS — M1611 Unilateral primary osteoarthritis, right hip: Secondary | ICD-10-CM | POA: Diagnosis not present

## 2019-03-31 DIAGNOSIS — Y999 Unspecified external cause status: Secondary | ICD-10-CM | POA: Insufficient documentation

## 2019-03-31 DIAGNOSIS — F17210 Nicotine dependence, cigarettes, uncomplicated: Secondary | ICD-10-CM | POA: Insufficient documentation

## 2019-03-31 DIAGNOSIS — Y929 Unspecified place or not applicable: Secondary | ICD-10-CM | POA: Insufficient documentation

## 2019-03-31 DIAGNOSIS — Z23 Encounter for immunization: Secondary | ICD-10-CM

## 2019-03-31 DIAGNOSIS — M542 Cervicalgia: Secondary | ICD-10-CM | POA: Insufficient documentation

## 2019-03-31 DIAGNOSIS — Y9389 Activity, other specified: Secondary | ICD-10-CM | POA: Diagnosis not present

## 2019-03-31 MED ORDER — ACETAMINOPHEN 325 MG PO TABS
650.0000 mg | ORAL_TABLET | Freq: Once | ORAL | Status: AC
  Administered 2019-03-31: 650 mg via ORAL
  Filled 2019-03-31: qty 2

## 2019-03-31 MED ORDER — METHOCARBAMOL 500 MG PO TABS: 500.0000 mg | ORAL_TABLET | Freq: Two times a day (BID) | ORAL | 0 refills | Status: DC | PRN
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
## 2019-03-31 NOTE — Progress Notes (Signed)
   Covid-19 Vaccination Clinic  Name:  ALEXEY STARWALT    MRN: [id number] DOB: [birth year]  03/31/2019  Mr. Eshbach was observed post Covid-19 immunization for 15 minutes without incident. He was provided with Vaccine Information Sheet and instruction to access the V-Safe system.   Mr. Lavery was instructed to call 911 with any severe reactions post vaccine: Marland Kitchen Difficulty breathing  . Swelling of face and throat  . A fast heartbeat  . A bad rash all over body  . Dizziness and weakness   Immunizations Administered    Name Date Dose VIS Date Route   Moderna COVID-19 Vaccine 03/31/2019 11:31 AM 0.5 mL 12/02/2018 Intramuscular   Manufacturer: Moderna   Lot: [id number]   [id number]

## 2019-08-24 ENCOUNTER — Telehealth: Payer: Self-pay

## 2019-08-24 ENCOUNTER — Other Ambulatory Visit: Payer: Self-pay

## 2019-08-24 DIAGNOSIS — F528 Other sexual dysfunction not due to a substance or known physiological condition: Secondary | ICD-10-CM

## 2019-08-25 MED ORDER — SILDENAFIL CITRATE 100 MG PO TABS: 100.0000 mg | ORAL_TABLET | Freq: Every day | ORAL | 0 refills | Status: DC | PRN

## 2019-08-25 NOTE — Telephone Encounter (Signed)
rx refill

## 2019-08-25 NOTE — Telephone Encounter (Signed)
OK to rf

## 2019-09-15 ENCOUNTER — Ambulatory Visit (INDEPENDENT_AMBULATORY_CARE_PROVIDER_SITE_OTHER): Payer: Medicare Other | Admitting: Urology

## 2019-09-15 ENCOUNTER — Other Ambulatory Visit: Payer: Self-pay

## 2019-09-15 ENCOUNTER — Encounter: Payer: Self-pay | Admitting: Urology

## 2019-09-15 VITALS — BP 129/85 | HR 79

## 2019-09-15 DIAGNOSIS — R3912 Poor urinary stream: Secondary | ICD-10-CM

## 2019-09-15 DIAGNOSIS — N5201 Erectile dysfunction due to arterial insufficiency: Secondary | ICD-10-CM

## 2019-09-15 DIAGNOSIS — N401 Enlarged prostate with lower urinary tract symptoms: Secondary | ICD-10-CM | POA: Diagnosis not present

## 2019-09-15 LAB — URINALYSIS, ROUTINE W REFLEX MICROSCOPIC
Bilirubin, UA: NEGATIVE
Glucose, UA: NEGATIVE
Ketones, UA: NEGATIVE
Leukocytes,UA: NEGATIVE
Nitrite, UA: NEGATIVE
Protein,UA: NEGATIVE
RBC, UA: NEGATIVE
Specific Gravity, UA: 1.02 (ref 1.005–1.030)
Urobilinogen, Ur: 1 mg/dL (ref 0.2–1.0)
pH, UA: 6.5 (ref 5.0–7.5)

## 2019-09-15 MED ORDER — SILDENAFIL CITRATE 100 MG PO TABS: 100.0000 mg | ORAL_TABLET | Freq: Every day | ORAL | 11 refills | Status: DC | PRN

## 2019-09-15 MED ORDER — TAMSULOSIN HCL 0.4 MG PO CAPS: 0.4000 mg | ORAL_CAPSULE | Freq: Every day | ORAL | 3 refills | Status: DC

## 2019-09-15 NOTE — Progress Notes (Signed)
H&P  Chief Complaint: Erectile Dysfunction  History of Present Illness:   9.14.2021: Pt here for annual f/u on ED treatment and reports a weakened and intermittent FOS. Pt discontinued tamsulosin because he thought that it was exacerbating his urinary symptoms. Pt's ED symptomatology is well-managed with sildenafil.  IPSS Questionnaire (AUA-7): Over the past month.   1)  How often have you had a sensation of not emptying your bladder completely after you finish urinating?  4 - More than half the time  2)  How often have you had to urinate again less than two hours after you finished urinating? 0 - Not at all  3)  How often have you found you stopped and started again several times when you urinated?  0 - Not at all  4) How difficult have you found it to postpone urination?  0 - Not at all  5) How often have you had a weak urinary stream?  4 - More than half the time  6) How often have you had to push or strain to begin urination?  4 - More than half the time  7) How many times did you most typically get up to urinate from the time you went to bed until the time you got up in the morning?  1 - 1 time  Total score:  0-7 mildly symptomatic   8-19 moderately symptomatic   20-35 severely symptomatic     (below copied from [facility] records):  CC: I have erectile dysfunction (Meds). HPI: James Cervantes is a 69 year-old male established patient who is here for an erectile dysfunction (meds).  He is currently on Viagra for treatment of his erectile dysfunction.   8.18.2020: He continues to use 100 mg viagra to manage ED -- he tolerates these very well with no side-effects.    CC: BPH HPI: The patient complains of lower urinary tract symptom(s) that include hesitancy and weak stream. Patient is currently treated with tamsulosin for his symptoms.   8.18.2020: He still is having a slow/weak stream, but he says this varies with his fluid intake. He continues on flomax and tolerates this well. He denies  any other major complaints. He reports taking pain medication 1x day for his hip, shoulder, and joints to manage chronic pain.     Past Medical History:  Diagnosis Date  . Anemia   . Arthritis   . Asthma   . Cardiomyopathy ([place])    a. 2014: EF 40-45%, diffuse HK, and Grade 2 DD  . Dyspnea   . Emphysema   . GERD (gastroesophageal reflux disease)   . Hip pain   . Hypertension   . Peripheral vascular disease ([place])   . Prostate disorder     Past Surgical History:  Procedure Laterality Date  . ABDOMINAL SURGERY    . CATARACT EXTRACTION W/PHACO Left 11/26/2016   Procedure: CATARACT EXTRACTION PHACO AND INTRAOCULAR LENS PLACEMENT LEFT EYE;  Surgeon: Tonny Branch, MD;  Location: [facility];  Service: Ophthalmology;  Laterality: Left;  CDE: 26.53   . CATARACT EXTRACTION W/PHACO Right 01/28/2017   Procedure: CATARACT EXTRACTION PHACO AND INTRAOCULAR LENS PLACEMENT RIGHT EYE;  Surgeon: Tonny Branch, MD;  Location: [facility];  Service: Ophthalmology;  Laterality: Right;  CDE: 6.79  . HERNIA REPAIR    . LUNG SURGERY    . MASS EXCISION Right 11/01/2017   Procedure: EXCISION CYST RIGHT KNEE;  Surgeon: Aviva Signs, MD;  Location: [facility];  Service: General;  Laterality: Right;  Marland Kitchen VASCULAR  SURGERY      Home Medications:  Allergies as of 09/15/2019   No Known Allergies     Medication List       Accurate as of September 15, 2019  1:50 PM. If you have any questions, ask your nurse or doctor.        albuterol 108 (90 Base) MCG/ACT inhaler Commonly known as: ProAir HFA Inhale 2 puffs into the lungs every 4 (four) hours as needed for wheezing or shortness of breath.   aspirin 325 MG tablet Take 1 tablet (325 mg total) by mouth daily.   atorvastatin 40 MG tablet Commonly known as: LIPITOR Take 1 tablet (40 mg total) by mouth daily at 6 PM.   azithromycin 500 MG tablet Commonly known as: ZITHROMAX Take 1 tablet (500 mg total) by mouth daily.   furosemide 40 MG tablet Commonly known as:  Lasix Take 1 tablet (40 mg total) by mouth daily.   losartan 25 MG tablet Commonly known as: COZAAR Take 1 tablet (25 mg total) by mouth daily.   methocarbamol 500 MG tablet Commonly known as: ROBAXIN Take 1 tablet (500 mg total) by mouth 2 (two) times daily as needed for muscle spasms.   metoprolol succinate 25 MG 24 hr tablet Commonly known as: TOPROL-XL Take 0.5 tablets (12.5 mg total) by mouth daily.   Omeprazole 20 MG Tbec Take 1 tablet (20 mg total) by mouth daily.   oxyCODONE 5 MG immediate release tablet Commonly known as: Oxy IR/ROXICODONE Take 5 mg by mouth at bedtime.   potassium chloride 10 MEQ tablet Commonly known as: KLOR-CON Take 1 tablet (10 mEq total) by mouth daily.   sildenafil 100 MG tablet Commonly known as: VIAGRA Take 1 tablet (100 mg total) by mouth daily as needed.   tamsulosin 0.4 MG Caps capsule Commonly known as: FLOMAX Take 0.4 mg by mouth at bedtime.   traMADol 50 MG tablet Commonly known as: ULTRAM Take 1 tablet (50 mg total) by mouth every 6 (six) hours as needed for moderate pain.       Allergies: No Known Allergies  Family History  Problem Relation Age of Onset  . Cancer Mother   . Hypertension Mother     Social History:  reports that he has been smoking cigarettes. He has a 25.00 pack-year smoking history. He has never used smokeless tobacco. He reports that he does not drink alcohol and does not use drugs.  ROS: A complete review of systems was performed.  All systems are negative except for pertinent findings as noted.  Physical Exam:  Vital signs in last 24 hours: There were no vitals taken for this visit. Constitutional:  Alert and oriented, No acute distress Cardiovascular: Regular rate  Respiratory: Normal respiratory effort GI: Abdomen is soft, nontender, nondistended, no abdominal masses. No CVAT. No hernias Genitourinary: Normal male phallus, Atrophic testicle (L). testes are descended bilaterally and non-tender  and without masses, scrotum is normal in appearance without lesions or masses, perineum is normal on inspection. Prostate feels about 20 grams. Neurologic: Grossly intact, no focal deficits Psychiatric: Normal mood and affect  I have reviewed prior pt notes  I have reviewed notes from referring/previous physicians  I have reviewed urinalysis results  I have independently reviewed prior imaging    Impression/Assessment:  1.  BPH.  He has tamsulosin but has not been taking this.  I reassured him that it is useful for his urinary symptoms  2.  ED.  On sildenafil.  Plan:  1. Pt advised to restart his tamsulosin and continued on sildenafil.  2. Will coordinate with PCP to ensure that his PSA is being checked annually.  2. F/U in 1 year for OV and symptom recheck.  CC: Dr. Lucia Gaskins

## 2019-09-15 NOTE — Progress Notes (Signed)
Urological Symptom Review  Patient is experiencing the following symptoms: Trouble starting stream Have to strain to urinate   Review of Systems  Gastrointestinal (upper)  : Negative for upper GI symptoms  Gastrointestinal (lower) : Negative for lower GI symptoms  Constitutional : Negative for symptoms  Skin: Negative for skin symptoms  Eyes: Negative for eye symptoms  Ear/Nose/Throat : Negative for Ear/Nose/Throat symptoms  Hematologic/Lymphatic: Negative for Hematologic/Lymphatic symptoms  Cardiovascular : Negative for cardiovascular symptoms  Respiratory : Shortness of breath  Endocrine: Negative for endocrine symptoms  Musculoskeletal: Negative for musculoskeletal symptoms  Neurological: Negative for neurological symptoms  Psychologic: Negative for psychiatric symptoms

## 2019-09-29 ENCOUNTER — Telehealth: Payer: Self-pay | Admitting: Urology

## 2019-09-29 NOTE — Telephone Encounter (Signed)
Pt stopped by wanting to know about his blood work. Can you call him and let him know? Thanks

## 2019-09-29 NOTE — Telephone Encounter (Signed)
Pt called and made aware of urine results from last office visit.

## 2020-03-01 ENCOUNTER — Encounter: Payer: Self-pay | Admitting: Internal Medicine

## 2020-03-06 ENCOUNTER — Encounter (HOSPITAL_COMMUNITY): Payer: Self-pay

## 2020-03-06 ENCOUNTER — Emergency Department (HOSPITAL_COMMUNITY): Payer: Medicare Other

## 2020-03-06 ENCOUNTER — Emergency Department (HOSPITAL_COMMUNITY)
Admission: EM | Admit: 2020-03-06 | Discharge: 2020-03-06 | Disposition: A | Discharge status: Home / Self Care | Payer: Medicare Other | Attending: Emergency Medicine | Admitting: Emergency Medicine

## 2020-03-06 ENCOUNTER — Other Ambulatory Visit: Payer: Self-pay

## 2020-03-06 DIAGNOSIS — Z7982 Long term (current) use of aspirin: Secondary | ICD-10-CM | POA: Diagnosis not present

## 2020-03-06 DIAGNOSIS — Z86018 Personal history of other benign neoplasm: Secondary | ICD-10-CM | POA: Insufficient documentation

## 2020-03-06 DIAGNOSIS — F17210 Nicotine dependence, cigarettes, uncomplicated: Secondary | ICD-10-CM | POA: Insufficient documentation

## 2020-03-06 DIAGNOSIS — J449 Chronic obstructive pulmonary disease, unspecified: Secondary | ICD-10-CM | POA: Diagnosis not present

## 2020-03-06 DIAGNOSIS — Z79899 Other long term (current) drug therapy: Secondary | ICD-10-CM | POA: Insufficient documentation

## 2020-03-06 DIAGNOSIS — I10 Essential (primary) hypertension: Secondary | ICD-10-CM | POA: Diagnosis not present

## 2020-03-06 DIAGNOSIS — Z20822 Contact with and (suspected) exposure to covid-19: Secondary | ICD-10-CM | POA: Insufficient documentation

## 2020-03-06 DIAGNOSIS — J90 Pleural effusion, not elsewhere classified: Secondary | ICD-10-CM

## 2020-03-06 DIAGNOSIS — J45909 Unspecified asthma, uncomplicated: Secondary | ICD-10-CM | POA: Insufficient documentation

## 2020-03-06 DIAGNOSIS — R0602 Shortness of breath: Secondary | ICD-10-CM | POA: Diagnosis present

## 2020-03-06 LAB — CBC WITH DIFFERENTIAL/PLATELET
Abs Immature Granulocytes: 0.01 10*3/uL (ref 0.00–0.07)
Basophils Absolute: 0 10*3/uL (ref 0.0–0.1)
Basophils Relative: 1 %
Eosinophils Absolute: 0.2 10*3/uL (ref 0.0–0.5)
Eosinophils Relative: 4 %
HCT: 36.9 % — ABNORMAL LOW (ref 39.0–52.0)
Hemoglobin: 11.2 g/dL — ABNORMAL LOW (ref 13.0–17.0)
Immature Granulocytes: 0 %
Lymphocytes Relative: 22 %
Lymphs Abs: 1.1 10*3/uL (ref 0.7–4.0)
MCH: 28.4 pg (ref 26.0–34.0)
MCHC: 30.4 g/dL (ref 30.0–36.0)
MCV: 93.7 fL (ref 80.0–100.0)
Monocytes Absolute: 0.6 10*3/uL (ref 0.1–1.0)
Monocytes Relative: 11 %
Neutro Abs: 3.1 10*3/uL (ref 1.7–7.7)
Neutrophils Relative %: 62 %
Platelets: 285 10*3/uL (ref 150–400)
RBC: 3.94 MIL/uL — ABNORMAL LOW (ref 4.22–5.81)
RDW: 15.2 % (ref 11.5–15.5)
WBC: 5.1 10*3/uL (ref 4.0–10.5)
nRBC: 0 % (ref 0.0–0.2)

## 2020-03-06 LAB — COMPREHENSIVE METABOLIC PANEL
ALT: 18 U/L (ref 0–44)
AST: 25 U/L (ref 15–41)
Albumin: 3.3 g/dL — ABNORMAL LOW (ref 3.5–5.0)
Alkaline Phosphatase: 61 U/L (ref 38–126)
Anion gap: 8 (ref 5–15)
BUN: 20 mg/dL (ref 8–23)
CO2: 30 mmol/L (ref 22–32)
Calcium: 8.7 mg/dL — ABNORMAL LOW (ref 8.9–10.3)
Chloride: 101 mmol/L (ref 98–111)
Creatinine, Ser: 1.15 mg/dL (ref 0.61–1.24)
GFR, Estimated: >60 mL/min (ref >60)
Glucose, Bld: 102 mg/dL — ABNORMAL HIGH (ref 70–99)
Potassium: 4.4 mmol/L (ref 3.5–5.1)
Sodium: 139 mmol/L (ref 135–145)
Total Bilirubin: 0.6 mg/dL (ref 0.3–1.2)
Total Protein: 6.5 g/dL (ref 6.5–8.1)

## 2020-03-06 LAB — TROPONIN I (HIGH SENSITIVITY)
Troponin I (High Sensitivity): 38 ng/L — ABNORMAL HIGH (ref <18)
Troponin I (High Sensitivity): 37 ng/L — ABNORMAL HIGH (ref <18)

## 2020-03-06 MED ORDER — IOHEXOL 350 MG/ML SOLN
75.0000 mL | Freq: Once | INTRAVENOUS | Status: AC | PRN
  Administered 2020-03-06: 75 mL via INTRAVENOUS

## 2020-03-06 MED ORDER — PREDNISONE 10 MG PO TABS: 40.0000 mg | ORAL_TABLET | Freq: Every day | ORAL | 0 refills | Status: DC

## 2020-03-06 MED ORDER — PREDNISONE 50 MG PO TABS
60.0000 mg | ORAL_TABLET | Freq: Once | ORAL | Status: AC
  Administered 2020-03-06: 60 mg via ORAL
  Filled 2020-03-06: qty 1

## 2020-03-06 NOTE — ED Notes (Signed)
Patient continually presses call bell. This nurse has been into the room multiple time. Pt demanding to get "medicine for my breathing. Ya'll aren't doing shit for me. Ive been sitting her for 3 hours and no one has brought me any coffee or water." When attempting to explain that blood has been drawn, EKG has been performed and x-rays are pending pt will not let me speak, keeps interrupting me and cursing stating that we are doing nothing for him. Patient threatening to leave, Dr. Rogene Houston notified and states patient can leave AMA if he wishes. Pt then requests to use the restroom. He walks to the restroom on his own then returns to his room. Does not want any "wires" hooked back up to him.

## 2020-03-06 NOTE — Discharge Instructions (Addendum)
Schedule appointment follow-up with your doctor.  Use your albuterol inhaler 2 puffs every 6 hours for the next 7 days.  Take prednisone for the next 5 days.  Today's work-up for the shortness of breath CT chest heart markers all without this explicit explanation for the shortness of breath.  You do have a little bit of a pleural effusion which her primary care doctor can follow-up.  Covid test is pending results will be available tomorrow through [facility].  You will be contacted if it is positive.

## 2020-03-06 NOTE — ED Provider Notes (Signed)
[facility] EMERGENCY DEPARTMENT Provider Note   CSN: [id number] Arrival date & time: 03/06/20  1439     History Chief Complaint  Patient presents with  . Shortness of Breath    James Cervantes is a 70 y.o. male.  Patient with the complained of feeling short of breath particularly with exertion.  Patient states he has a history of asthma has been using his albuterol inhaler.  But he does not feel like he is wheezing.  He has been referred to a specialist by his primary care doctor but he has not seen them yet.  Patient nontoxic no acute distress here.  Oxygen saturations on room air 98% respirations 18 heart rate 73 temp 97.6.  Patient denies any fever or cough.  Patient denies any chest pain.  EKG done out in triage raises some concerns for acute lateral infarct.  Patient states that one of his lungs is collapsed and had part of it removed.        Past Medical History:  Diagnosis Date  . Anemia   . Arthritis   . Asthma   . Cardiomyopathy ([place])    a. 2014: EF 40-45%, diffuse HK, and Grade 2 DD  . Dyspnea   . Emphysema   . GERD (gastroesophageal reflux disease)   . Hip pain   . Hypertension   . Peripheral vascular disease ([place])   . Prostate disorder     Patient Active Problem List   Diagnosis Date Noted  . Noncompliance 12/16/2017  . Elevated troponin 12/16/2017  . Bronchitis 12/16/2017  . COPD with acute exacerbation ([place]) 12/15/2017  . Lipoma of extremity   . 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 Atypical chest pain 10/13/2012  . Chest pain 10/12/2012  . Abnormal EKG 10/12/2012  . GERD (gastroesophageal reflux disease) 10/12/2012  . UNSPECIFIED ANEMIA 05/18/2008  . ERECTILE DYSFUNCTION 09/25/2007  . UNS PULMONARY TB-CULT DX 06/19/2007  . UNDERWEIGHT 06/19/2007  . OSTEOARTHRITIS, HIP, RIGHT 02/18/2006  . HEPATITIS C 01/08/2006  . DISORDER, TOBACCO USE 01/08/2006  . MIGRAINE HEADACHE 01/08/2006  . ALLERGIC RHINITIS 01/08/2006  . GERD 01/08/2006  . BPH  (benign prostatic hyperplasia) 01/08/2006  . OSTEOARTHRITIS 01/08/2006    Past Surgical History:  Procedure Laterality Date  . ABDOMINAL SURGERY    . CATARACT EXTRACTION W/PHACO Left 11/26/2016   Procedure: CATARACT EXTRACTION PHACO AND INTRAOCULAR LENS PLACEMENT LEFT EYE;  Surgeon: Tonny Branch, MD;  Location: [facility];  Service: Ophthalmology;  Laterality: Left;  CDE: 26.53   . CATARACT EXTRACTION W/PHACO Right 01/28/2017   Procedure: CATARACT EXTRACTION PHACO AND INTRAOCULAR LENS PLACEMENT RIGHT EYE;  Surgeon: Tonny Branch, MD;  Location: [facility];  Service: Ophthalmology;  Laterality: Right;  CDE: 6.79  . HERNIA REPAIR    . LUNG SURGERY    . MASS EXCISION Right 11/01/2017   Procedure: EXCISION CYST RIGHT KNEE;  Surgeon: Aviva Signs, MD;  Location: [facility];  Service: General;  Laterality: Right;  Marland Kitchen VASCULAR SURGERY         Family History  Problem Relation Age of Onset  . Cancer Mother   . Hypertension Mother     Social History   Tobacco Use  . Smoking status: Current Every Day Smoker    Packs/day: 0.50    Years: 50.00    Pack years: 25.00    Types: Cigarettes  . Smokeless tobacco: Never Used  Vaping Use  . Vaping Use: Never used  Substance Use Topics  . Alcohol use: No  .  Drug use: No    Home Medications Prior to Admission medications   Medication Sig Start Date End Date Taking? Authorizing Provider  albuterol (PROAIR HFA) 108 (90 Base) MCG/ACT inhaler Inhale 2 puffs into the lungs every 4 (four) hours as needed for wheezing or shortness of breath. 12/16/17  Yes Johnson, Clanford L, MD  digoxin (LANOXIN) 0.125 MG tablet Take 125 mcg by mouth daily. 02/05/20  Yes [provider]  furosemide (LASIX) 40 MG tablet Take 1 tablet (40 mg total) by mouth daily. 04/06/17 04/06/18 Yes Sinda Du, MD  losartan (COZAAR) 25 MG tablet Take 1 tablet (25 mg total) by mouth daily. 04/06/17  Yes Sinda Du, MD  metoprolol succinate (TOPROL-XL) 25 MG 24 hr tablet Take 0.5  tablets (12.5 mg total) by mouth daily. 04/06/17  Yes Sinda Du, MD  oxyCODONE (OXY IR/ROXICODONE) 5 MG immediate release tablet Take 5 mg by mouth at bedtime.    Yes [provider]  pantoprazole (PROTONIX) 40 MG tablet Take 40 mg by mouth daily. 02/22/20  Yes [provider]  predniSONE (DELTASONE) 10 MG tablet Take 4 tablets (40 mg total) by mouth daily. 03/06/20  Yes Fredia Sorrow, MD  sildenafil (VIAGRA) 100 MG tablet Take 1 tablet (100 mg total) by mouth daily as needed. 09/15/19  Yes Dahlstedt, Annie Main, MD  ANORO ELLIPTA 62.5-25 MCG/INH AEPB Inhale 1 puff into the lungs daily. 02/17/20   [provider]  aspirin 325 MG tablet Take 1 tablet (325 mg total) by mouth daily. Patient not taking: No sig reported 12/17/17   Irwin Brakeman L, MD  atorvastatin (LIPITOR) 40 MG tablet Take 1 tablet (40 mg total) by mouth daily at 6 PM. Patient not taking: Reported on 03/06/2020 12/16/17 01/15/18  Murlean Iba, MD  methocarbamol (ROBAXIN) 500 MG tablet Take 1 tablet (500 mg total) by mouth 2 (two) times daily as needed for muscle spasms. Patient not taking: No sig reported 03/31/19   Petrucelli, Samantha R, PA-C  Omeprazole 20 MG TBEC Take 1 tablet (20 mg total) by mouth daily. Patient not taking: Reported on 03/06/2020 12/16/17 01/15/18  Irwin Brakeman L, MD  potassium chloride (K-DUR) 10 MEQ tablet Take 1 tablet (10 mEq total) by mouth daily. Patient not taking: Reported on 03/06/2020 04/06/17   Sinda Du, MD  predniSONE (DELTASONE) 20 MG tablet Take by mouth. Patient not taking: No sig reported 08/20/19   [provider]  tamsulosin (FLOMAX) 0.4 MG CAPS capsule Take 1 capsule (0.4 mg total) by mouth at bedtime. Patient not taking: No sig reported 09/15/19   Franchot Gallo, MD  traMADol (ULTRAM) 50 MG tablet Take 1 tablet (50 mg total) by mouth every 6 (six) hours as needed for moderate pain. 11/01/17   Aviva Signs, MD  TRELEGY ELLIPTA 100-62.5-25  MCG/INH AEPB Inhale 1 puff into the lungs daily. Patient not taking: No sig reported 08/17/19   [provider]    Allergies    Patient has no known allergies.  Review of Systems   Review of Systems  Constitutional: Negative for chills and fever.  HENT: Negative for rhinorrhea and sore throat.   Eyes: Negative for visual disturbance.  Respiratory: Positive for shortness of breath. Negative for cough.   Cardiovascular: Negative for chest pain and leg swelling.  Gastrointestinal: Negative for abdominal pain, diarrhea, nausea and vomiting.  Genitourinary: Negative for dysuria.  Musculoskeletal: Negative for back pain and neck pain.  Skin: Negative for rash.  Neurological: Negative for dizziness, light-headedness and headaches.  Hematological: Does not bruise/bleed easily.  Psychiatric/Behavioral: Negative for confusion.    Physical Exam Updated Vital Signs BP 139/88   Pulse 70   Temp 97.6 F (36.4 C) (Oral)   Resp (!) 22   Ht 1.676 m (5\' 6" )   Wt 51.3 kg   SpO2 96%   BMI 18.24 kg/m   Physical Exam Vitals and nursing note reviewed.  Constitutional:      General: He is not in acute distress.    Appearance: He is well-developed and well-nourished.  HENT:     Head: Normocephalic and atraumatic.  Eyes:     Extraocular Movements: Extraocular movements intact.     Conjunctiva/sclera: Conjunctivae normal.     Pupils: Pupils are equal, round, and reactive to light.  Cardiovascular:     Rate and Rhythm: Normal rate and regular rhythm.     Heart sounds: No murmur heard.   Pulmonary:     Effort: Pulmonary effort is normal. No respiratory distress.     Breath sounds: Rales present.  Chest:     Chest wall: No tenderness.  Abdominal:     Palpations: Abdomen is soft.     Tenderness: There is no abdominal tenderness.  Musculoskeletal:        General: No swelling or edema.     Cervical back: Neck supple.  Skin:    General: Skin is warm and dry.     Capillary  Refill: Capillary refill takes less than 2 seconds.  Neurological:     General: No focal deficit present.     Mental Status: He is alert and oriented to person, place, and time.     Cranial Nerves: No cranial nerve deficit.     Sensory: No sensory deficit.     Motor: No weakness.  Psychiatric:        Mood and Affect: Mood and affect normal.     ED Results / Procedures / Treatments   Labs (all labs ordered are listed, but only abnormal results are displayed) Labs Reviewed  COMPREHENSIVE METABOLIC PANEL - Abnormal; Notable for the following components:      Result Value   Glucose, Bld 102 (*)    Calcium 8.7 (*)    Albumin 3.3 (*)    All other components within normal limits  CBC WITH DIFFERENTIAL/PLATELET - Abnormal; Notable for the following components:   RBC 3.94 (*)    Hemoglobin 11.2 (*)    HCT 36.9 (*)    All other components within normal limits  TROPONIN I (HIGH SENSITIVITY) - Abnormal; Notable for the following components:   Troponin I (High Sensitivity) 38 (*)    All other components within normal limits  TROPONIN I (HIGH SENSITIVITY) - Abnormal; Notable for the following components:   Troponin I (High Sensitivity) 37 (*)    All other components within normal limits  SARS CORONAVIRUS 2 (TAT 6-24 HRS)    EKG EKG Interpretation  Date/Time:  Sunday March 06 2020 15:43:29 EST Ventricular Rate:  69 PR Interval:    QRS Duration: 90 QT Interval:  518 QTC Calculation: 555 R Axis:   81 Text Interpretation: Sinus rhythm LVH with secondary repolarization abnormality Lateral infarct, acute Probable anterior infarct, age indeterminate Prolonged QT interval Baseline wander in lead(s) V6 Interpretation limited secondary to artifact Confirmed by Fredia Sorrow [phone]) on 03/06/2020 3:59:29 PM   Radiology CT Angio Chest PE W/Cm &/Or Wo Cm  Result Date: 03/06/2020 CLINICAL DATA:  Shortness of breath, worse with exertion. History of asthma.  Emphysema. Lung surgery. EXAM: CT  ANGIOGRAPHY CHEST WITH CONTRAST TECHNIQUE: Multidetector CT imaging of the chest was performed using the standard protocol during bolus administration of intravenous contrast. Multiplanar CT image reconstructions and MIPs were obtained to evaluate the vascular anatomy. CONTRAST:  48mL OMNIPAQUE IOHEXOL 350 MG/ML SOLN COMPARISON:  Plain film 03/06/2020. Most recent chest CT 04/04/2017. FINDINGS: Cardiovascular: The quality of this exam for evaluation of pulmonary embolism is moderate. Although the bolus is well timed, there is mild motion degradation throughout. Redemonstration of heterogeneous opacification of the central left pulmonary artery, including on 110/6. The more inferior remaining left pulmonary artery is hypoenhancing including on 124/6, similar to 04/04/2017. On the right, no pulmonary embolism is identified with lower lung limitations to the segmental level. Marked pulmonary artery enlargement including outflow tract of 3.8 cm, chronic. Aortic atherosclerosis. Aorta not well opacified for evaluation of dissection. No aneurysm. Moderate cardiomegaly with elevated right heart pressures as evidenced by reflux of contrast into the IVC and hepatic veins. Mediastinum/Nodes: Limited evaluation for thoracic adenopathy secondary to above motion and paucity of fat. No gross mediastinal or hilar adenopathy. Lungs/Pleura: Similar left-sided pleural thickening and minimal inferior left pleural fluid. Small right pleural effusion is similar. Status post left lower lobectomy. Moderate centrilobular emphysema. Bilateral bronchial wall thickening is chronic. Lucency in the superior left hemithorax is suspicious for trace loculated pleural air and is similar to 2019, including on 30/7. Within the remaining left lung, similar appearance of primarily central consolidation likely related to peribronchial thickening and scarring. Some of this is likely related to surgical sutures inferiorly. Upper Abdomen: No gross  additional findings. Musculoskeletal: Presumably postsurgical left-sided rib defects are chronic. S shaped thoracic spine curvature. Review of the MIP images confirms the above findings. IMPRESSION: 1. Moderate quality evaluation for pulmonary embolism, as detailed above. No acute pulmonary embolism given these limitations. 2. Relatively similar appearance of the left hemithorax since 04/04/2017. Left lower lobectomy with volume loss and presumed scarring throughout the remaining left lung. 3. Marked pulmonary artery enlargement, consistent with pulmonary arterial hypertension. Similar appearance of left pulmonary artery, with presumed mixing proximally and absent opacification distally. This could be due to chronic thrombus or decreased flow in the setting of prior surgery. 4. Right greater left pleural effusions with chronic left pleural thickening. 5. Aortic atherosclerosis (ICD10-I70.0) and emphysema (ICD10-J43.9). 6. Findings of right heart increased pressures. 7. Suspect small volume loculated pleural air in the superior left hemithorax, similar to 2019. Cannot exclude bronchopleural fistula. Electronically Signed   By: Abigail Miyamoto M.D.   On: 03/06/2020 19:30   DG Chest Port 1 View  Result Date: 03/06/2020 CLINICAL DATA:  Short of breath.  Empyema. EXAM: PORTABLE CHEST 1 VIEW COMPARISON:  Radiograph 12/15/2017 CT 04/04/2017 FINDINGS: Volume loss in the LEFT hemithorax unchanged from prior. Small amount of aerated lung at the LEFT upper lobe again noted. Majority of the LEFT hemithorax is opacified. The RIGHT lung is hyperexpanded. No focal abnormality in the RIGHT lung. No Aggressive osseous lesion. Thoracotomy along the LEFT chest wall IMPRESSION: 1. No significant change from comparison exam. 2. No clear acute findings. 3. Volume loss in the LEFT hemithorax with dense lower lobe opacification. 4. RIGHT lung clear. Electronically Signed   By: Suzy Bouchard M.D.   On: 03/06/2020 16:44     Procedures Procedures      Medications Ordered in ED Medications  predniSONE (DELTASONE) tablet 60 mg (has no administration in time range)  iohexol (OMNIPAQUE) 350 MG/ML injection 75  mL (75 mLs Intravenous Contrast Given 03/06/20 1854)    ED Course  I have reviewed the triage vital signs and the nursing notes.  Pertinent labs & imaging results that were available during my care of the patient were reviewed by me and considered in my medical decision making (see chart for details).    MDM Rules/Calculators/A&P                          Patient nontoxic no acute distress here no hypoxia.  Troponins were done x2 initial and was elevated but no significant change between the troponins.  This was done mostly out of concern for the EKG findings although patient had no chest pain.  Work-up for the shortness of breath chest x-ray unchanged from the past.  CT angio chest done as well.  No evidence of any significant pulmonary embolus.  There is evidence of pleural effusion.  Patient does have evidence of opacified left lung again unchanged.  Possible pleural effusion could be having some of his symptoms.  But patient is in no acute distress here.  Able to ambulate without any respiratory distress.  Patient will be given a course of prednisone first dose here tonight.  Have him take that for 5 days along with his albuterol inhaler and follow back up with his primary care provider.   Final Clinical Impression(s) / ED Diagnoses Final diagnoses:  SOB (shortness of breath)  Pleural effusion    Rx / DC Orders ED Discharge Orders         Ordered    predniSONE (DELTASONE) 10 MG tablet  Daily        03/06/20 2017           Fredia Sorrow, MD 03/06/20 2024

## 2020-03-06 NOTE — ED Notes (Signed)
Dr. Rogene Houston at bedside at this time.

## 2020-03-06 NOTE — ED Triage Notes (Signed)
Pt to er, pt states that he is here because he is feeling sob, states that he gets short of breath with exertion, states that he has a hx of asthma, and this is different because he doesn't have a wheeze, states that his pmd has tried to refer him to a specialist; however, pt feels as though this is taking too long.  Pt talking in full sentences, resps even and unlabored.

## 2020-03-06 NOTE — ED Notes (Signed)
Pt returned from CT via stretcher accompanied by radiology staff.

## 2020-03-07 LAB — SARS CORONAVIRUS 2 (TAT 6-24 HRS): SARS Coronavirus 2: NEGATIVE

## 2020-03-14 ENCOUNTER — Other Ambulatory Visit (HOSPITAL_COMMUNITY): Payer: Self-pay | Admitting: Family Medicine

## 2020-03-14 ENCOUNTER — Ambulatory Visit (HOSPITAL_COMMUNITY)
Admission: RE | Admit: 2020-03-14 | Discharge: 2020-03-14 | Disposition: A | Discharge status: Home / Self Care | Payer: Medicare Other | Source: Ambulatory Visit | Attending: Family Medicine | Admitting: Family Medicine

## 2020-03-14 DIAGNOSIS — R0602 Shortness of breath: Secondary | ICD-10-CM | POA: Diagnosis not present

## 2020-03-28 ENCOUNTER — Other Ambulatory Visit (HOSPITAL_COMMUNITY): Payer: Self-pay | Admitting: Family Medicine

## 2020-03-28 ENCOUNTER — Other Ambulatory Visit: Payer: Self-pay | Admitting: Family Medicine

## 2020-03-28 DIAGNOSIS — R0602 Shortness of breath: Secondary | ICD-10-CM

## 2020-04-06 ENCOUNTER — Ambulatory Visit: Payer: Medicare Other | Admitting: Internal Medicine

## 2020-04-06 ENCOUNTER — Encounter: Payer: Self-pay | Admitting: Internal Medicine

## 2020-04-12 ENCOUNTER — Emergency Department (HOSPITAL_COMMUNITY): Payer: Medicare Other

## 2020-04-12 ENCOUNTER — Emergency Department (HOSPITAL_COMMUNITY)
Admission: EM | Admit: 2020-04-12 | Discharge: 2020-04-12 | Disposition: A | Discharge status: Home / Self Care | Payer: Medicare Other | Attending: Emergency Medicine | Admitting: Emergency Medicine

## 2020-04-12 ENCOUNTER — Encounter (HOSPITAL_COMMUNITY): Payer: Self-pay | Admitting: Emergency Medicine

## 2020-04-12 DIAGNOSIS — R600 Localized edema: Secondary | ICD-10-CM | POA: Diagnosis not present

## 2020-04-12 DIAGNOSIS — F17210 Nicotine dependence, cigarettes, uncomplicated: Secondary | ICD-10-CM | POA: Diagnosis not present

## 2020-04-12 DIAGNOSIS — R1909 Other intra-abdominal and pelvic swelling, mass and lump: Secondary | ICD-10-CM | POA: Diagnosis present

## 2020-04-12 DIAGNOSIS — I110 Hypertensive heart disease with heart failure: Secondary | ICD-10-CM | POA: Insufficient documentation

## 2020-04-12 DIAGNOSIS — J45909 Unspecified asthma, uncomplicated: Secondary | ICD-10-CM | POA: Diagnosis not present

## 2020-04-12 DIAGNOSIS — Z7951 Long term (current) use of inhaled steroids: Secondary | ICD-10-CM | POA: Diagnosis not present

## 2020-04-12 DIAGNOSIS — Z79899 Other long term (current) drug therapy: Secondary | ICD-10-CM | POA: Diagnosis not present

## 2020-04-12 DIAGNOSIS — I509 Heart failure, unspecified: Secondary | ICD-10-CM | POA: Diagnosis not present

## 2020-04-12 DIAGNOSIS — N433 Hydrocele, unspecified: Secondary | ICD-10-CM | POA: Diagnosis not present

## 2020-04-12 DIAGNOSIS — J441 Chronic obstructive pulmonary disease with (acute) exacerbation: Secondary | ICD-10-CM | POA: Insufficient documentation

## 2020-04-12 DIAGNOSIS — R609 Edema, unspecified: Secondary | ICD-10-CM

## 2020-04-12 LAB — BASIC METABOLIC PANEL
Anion gap: 8 (ref 5–15)
BUN: 20 mg/dL (ref 8–23)
CO2: 30 mmol/L (ref 22–32)
Calcium: 8.3 mg/dL — ABNORMAL LOW (ref 8.9–10.3)
Chloride: 102 mmol/L (ref 98–111)
Creatinine, Ser: 1.08 mg/dL (ref 0.61–1.24)
GFR, Estimated: >60 mL/min (ref >60)
Glucose, Bld: 89 mg/dL (ref 70–99)
Potassium: 4.1 mmol/L (ref 3.5–5.1)
Sodium: 140 mmol/L (ref 135–145)

## 2020-04-12 LAB — CBC
HCT: 36.6 % — ABNORMAL LOW (ref 39.0–52.0)
Hemoglobin: 11.8 g/dL — ABNORMAL LOW (ref 13.0–17.0)
MCH: 29.9 pg (ref 26.0–34.0)
MCHC: 32.2 g/dL (ref 30.0–36.0)
MCV: 92.9 fL (ref 80.0–100.0)
Platelets: 284 10*3/uL (ref 150–400)
RBC: 3.94 MIL/uL — ABNORMAL LOW (ref 4.22–5.81)
RDW: 15.3 % (ref 11.5–15.5)
WBC: 4.4 10*3/uL (ref 4.0–10.5)
nRBC: 0 % (ref 0.0–0.2)

## 2020-04-12 LAB — BRAIN NATRIURETIC PEPTIDE: B Natriuretic Peptide: 2972 pg/mL — ABNORMAL HIGH (ref 0.0–100.0)

## 2020-04-12 MED ORDER — FUROSEMIDE 10 MG/ML IJ SOLN
40.0000 mg | Freq: Once | INTRAMUSCULAR | Status: AC
  Administered 2020-04-12: 40 mg via INTRAVENOUS
  Filled 2020-04-12: qty 4

## 2020-04-12 NOTE — Discharge Instructions (Addendum)
It is very important for you to take your furosemide (fluid pill) daily to help get rid of the fluid in your legs and your scrotum.  Elevation (propping feet up on a footstool, and placing a pillow or folded towels under your scrotum for support will also help to lessen the swelling.  Return here immediately if you develop worsening swelling or you develop increasing shortness of breath, weakness or fatigue.

## 2020-04-12 NOTE — ED Provider Notes (Signed)
[facility] Provider Note   CSN: [id number] Arrival date & time: 04/12/20  1405     History Chief Complaint  Patient presents with  . Groin Swelling    James Cervantes is a 70 y.o. male with a history including anemia, asthma, cardiomyopathy, emphysema, hypertension and peripheral vascular disease, also reports history of scrotal edema of unclear etiology when he was a teenager presenting with a 3-day history of increasing bilateral scrotal edema.  He does state his left testes "disappeared" with that event (denies surgical intervention).  He denies pain including penile pain, discharge, dysuria and denies scrotal pain.  He does endorse chronic but now worsened bilateral lower extremity edema for which he is prescribed lasix but has not taken in the past 3 days as someone told him is bad for kidneys.  He denies shortness of breath or chest pain at rest or with ambulation, but he frequently does get winded when he tries to lie flat.  Denies fevers or chills, no cough, no palpitations.  His past medical history notes a hernia surgery, patient is not aware of this history.  He has employed warm compresses to the scrotum without swelling relief.  The history is provided by the patient.       Past Medical History:  Diagnosis Date  . Anemia   . Arthritis   . Asthma   . Cardiomyopathy ([place])    a. 2014: EF 40-45%, diffuse HK, and Grade 2 DD  . Dyspnea   . Emphysema   . GERD (gastroesophageal reflux disease)   . Hip pain   . Hypertension   . Peripheral vascular disease ([place])   . Prostate disorder     Patient Active Problem List   Diagnosis Date Noted  . Noncompliance 12/16/2017  . Elevated troponin 12/16/2017  . Bronchitis 12/16/2017  . COPD with acute exacerbation ([place]) 12/15/2017  . Lipoma of extremity   . Secondary cardiomyopathy ([place]) 10/14/2012  . Atypical chest pain 10/13/2012  . Chest pain 10/12/2012  . Abnormal EKG 10/12/2012  . GERD (gastroesophageal  reflux disease) 10/12/2012  . UNSPECIFIED ANEMIA 05/18/2008  . ERECTILE DYSFUNCTION 09/25/2007  . UNS PULMONARY TB-CULT DX 06/19/2007  . UNDERWEIGHT 06/19/2007  . OSTEOARTHRITIS, HIP, RIGHT 02/18/2006  . HEPATITIS C 01/08/2006  . DISORDER, TOBACCO USE 01/08/2006  . MIGRAINE HEADACHE 01/08/2006  . ALLERGIC RHINITIS 01/08/2006  . GERD 01/08/2006  . BPH (benign prostatic hyperplasia) 01/08/2006  . OSTEOARTHRITIS 01/08/2006    Past Surgical History:  Procedure Laterality Date  . ABDOMINAL SURGERY    . CATARACT EXTRACTION W/PHACO Left 11/26/2016   Procedure: CATARACT EXTRACTION PHACO AND INTRAOCULAR LENS PLACEMENT LEFT EYE;  Surgeon: Tonny Branch, MD;  Location: [facility];  Service: Ophthalmology;  Laterality: Left;  CDE: 26.53   . CATARACT EXTRACTION W/PHACO Right 01/28/2017   Procedure: CATARACT EXTRACTION PHACO AND INTRAOCULAR LENS PLACEMENT RIGHT EYE;  Surgeon: Tonny Branch, MD;  Location: [facility];  Service: Ophthalmology;  Laterality: Right;  CDE: 6.79  . HERNIA REPAIR    . LUNG SURGERY    . MASS EXCISION Right 11/01/2017   Procedure: EXCISION CYST RIGHT KNEE;  Surgeon: Aviva Signs, MD;  Location: [facility];  Service: General;  Laterality: Right;  Marland Kitchen VASCULAR SURGERY         Family History  Problem Relation Age of Onset  . Cancer Mother   . Hypertension Mother     Social History   Tobacco Use  . Smoking status: Current Every Day Smoker  Packs/day: 0.50    Years: 50.00    Pack years: 25.00    Types: Cigarettes  . Smokeless tobacco: Never Used  Vaping Use  . Vaping Use: Never used  Substance Use Topics  . Alcohol use: No  . Drug use: No    Home Medications Prior to Admission medications   Medication Sig Start Date End Date Taking? Authorizing Provider  albuterol (PROAIR HFA) 108 (90 Base) MCG/ACT inhaler Inhale 2 puffs into the lungs every 4 (four) hours as needed for wheezing or shortness of breath. 12/16/17  Yes Johnson, Clanford L, MD  ANORO ELLIPTA 62.5-25 MCG/INH  AEPB Inhale 1 puff into the lungs daily. 02/17/20  Yes [provider]  cholecalciferol (VITAMIN D) 25 MCG (1000 UNIT) tablet Take 1,000 Units by mouth daily.   Yes [provider]  furosemide (LASIX) 40 MG tablet Take 1 tablet (40 mg total) by mouth daily. 04/06/17 04/06/18 Yes Sinda Du, MD  losartan (COZAAR) 25 MG tablet Take 1 tablet (25 mg total) by mouth daily. 04/06/17  Yes Sinda Du, MD  metoprolol succinate (TOPROL-XL) 25 MG 24 hr tablet Take 0.5 tablets (12.5 mg total) by mouth daily. 04/06/17  Yes Sinda Du, MD  oxyCODONE (OXY IR/ROXICODONE) 5 MG immediate release tablet Take 5 mg by mouth at bedtime.    Yes [provider]  pantoprazole (PROTONIX) 40 MG tablet Take 40 mg by mouth daily. 02/22/20  Yes [provider]  sildenafil (VIAGRA) 100 MG tablet Take 1 tablet (100 mg total) by mouth daily as needed. 09/15/19  Yes Dahlstedt, Annie Main, MD  aspirin 325 MG tablet Take 1 tablet (325 mg total) by mouth daily. Patient not taking: No sig reported 12/17/17   Irwin Brakeman L, MD  atorvastatin (LIPITOR) 40 MG tablet Take 1 tablet (40 mg total) by mouth daily at 6 PM. Patient not taking: Reported on 03/06/2020 12/16/17 01/15/18  Murlean Iba, MD  digoxin (LANOXIN) 0.125 MG tablet Take 125 mcg by mouth daily. 02/05/20   [provider]  methocarbamol (ROBAXIN) 500 MG tablet Take 1 tablet (500 mg total) by mouth 2 (two) times daily as needed for muscle spasms. Patient not taking: No sig reported 03/31/19   Petrucelli, Samantha R, PA-C  Omeprazole 20 MG TBEC Take 1 tablet (20 mg total) by mouth daily. Patient not taking: Reported on 03/06/2020 12/16/17 01/15/18  Irwin Brakeman L, MD  potassium chloride (K-DUR) 10 MEQ tablet Take 1 tablet (10 mEq total) by mouth daily. Patient not taking: Reported on 03/06/2020 04/06/17   Sinda Du, MD  predniSONE (DELTASONE) 10 MG tablet Take 4 tablets (40 mg total) by mouth daily. Patient not taking: No  sig reported 03/06/20   Fredia Sorrow, MD  predniSONE (DELTASONE) 20 MG tablet Take by mouth. Patient not taking: No sig reported 08/20/19   [provider]  tamsulosin (FLOMAX) 0.4 MG CAPS capsule Take 1 capsule (0.4 mg total) by mouth at bedtime. Patient not taking: No sig reported 09/15/19   Franchot Gallo, MD  traMADol (ULTRAM) 50 MG tablet Take 1 tablet (50 mg total) by mouth every 6 (six) hours as needed for moderate pain. Patient not taking: Reported on 04/12/2020 11/01/17   Aviva Signs, MD  TRELEGY ELLIPTA 100-62.5-25 MCG/INH AEPB Inhale 1 puff into the lungs daily. Patient not taking: No sig reported 08/17/19   [provider]    Allergies    Patient has no known allergies.  Review of Systems   Review of Systems  Constitutional:  Negative for chills and fever.  HENT: Negative for congestion and sore throat.   Eyes: Negative.   Respiratory: Positive for shortness of breath. Negative for chest tightness.   Cardiovascular: Positive for leg swelling. Negative for chest pain.  Gastrointestinal: Negative for abdominal pain, nausea and vomiting.  Genitourinary: Positive for scrotal swelling. Negative for dysuria, flank pain, penile discharge, penile pain and testicular pain.  Musculoskeletal: Negative for arthralgias, joint swelling and neck pain.  Skin: Negative.  Negative for rash and wound.  Neurological: Negative for dizziness, weakness, light-headedness, numbness and headaches.  Psychiatric/Behavioral: Negative.     Physical Exam Updated Vital Signs BP (!) 140/96   Pulse 78   Resp (!) 22   SpO2 98%   Physical Exam Vitals and nursing note reviewed. Exam conducted with a chaperone present.  Constitutional:      Appearance: He is well-developed.  HENT:     Head: Normocephalic and atraumatic.  Eyes:     Conjunctiva/sclera: Conjunctivae normal.  Cardiovascular:     Rate and Rhythm: Normal rate and regular rhythm.     Heart sounds: Normal heart  sounds.  Pulmonary:     Effort: Pulmonary effort is normal.     Breath sounds: Rales present. No wheezing or rhonchi.     Comments: Faint rales right base Abdominal:     General: Bowel sounds are normal.     Palpations: Abdomen is soft.     Tenderness: There is no abdominal tenderness.     Hernia: There is no hernia in the left inguinal area or right inguinal area.  Genitourinary:    Penis: Normal and uncircumcised.      Testes:        Right: Testicular hydrocele present. Mass, tenderness or varicocele not present.        Left: Testicular hydrocele present. Varicocele not present.     Epididymis:     Right: No mass or tenderness.     Comments: Moderate bilateral scrotal edema.  nontender. Musculoskeletal:        General: Normal range of motion.     Cervical back: Normal range of motion.     Comments: Pitting edema lower bilateral legs through distal thigh.   Skin:    General: Skin is warm and dry.  Neurological:     Mental Status: He is alert.     ED Results / Procedures / Treatments   Labs (all labs ordered are listed, but only abnormal results are displayed) Labs Reviewed  BASIC METABOLIC PANEL - Abnormal; Notable for the following components:      Result Value   Calcium 8.3 (*)    All other components within normal limits  CBC - Abnormal; Notable for the following components:   RBC 3.94 (*)    Hemoglobin 11.8 (*)    HCT 36.6 (*)    All other components within normal limits  BRAIN NATRIURETIC PEPTIDE - Abnormal; Notable for the following components:   B Natriuretic Peptide 2,972.0 (*)    All other components within normal limits    EKG EKG Interpretation  Date/Time:  Tuesday April 12 2020 14:56:23 EDT Ventricular Rate:  75 PR Interval:  172 QRS Duration: 102 QT Interval:  392 QTC Calculation: 438 R Axis:   55 Text Interpretation: Sinus rhythm Left ventricular hypertrophy Baseline wanderV2, I Lateral T wave inversions noted on priortracing Dec2019  Confirmed by Octaviano Glow [phone]) on 04/12/2020 3:18:57 PM   Radiology US Scrotum  Result Date: 04/12/2020 CLINICAL DATA:  Scrotal  edema EXAM: ULTRASOUND OF SCROTUM TECHNIQUE: Complete ultrasound examination of the testicles, epididymis, and other scrotal structures was performed. COMPARISON:  None. FINDINGS: Right testicle Measurements: 4.0 x 2.9 x 2.8 cm. No mass or microlithiasis visualized. Left testicle Measurements: 4.2 x 3.0 x 2.7 cm. No mass or microlithiasis visualized. Right epididymis:  Normal in size and appearance. Left epididymis:  Normal in size and appearance. Hydrocele:  Moderate to large bilateral hydroceles. Varicocele:  None visualized. IMPRESSION: 1. Moderate to large bilateral hydroceles. 2. Otherwise, normal scrotal ultrasound. Electronically Signed   By: Abigail Miyamoto M.D.   On: 04/12/2020 15:46   DG Chest Port 1 View  Result Date: 04/12/2020 CLINICAL DATA:  Shortness of breath EXAM: PORTABLE CHEST 1 VIEW COMPARISON:  03/14/2020 FINDINGS: Right lung remains well aerated without focal infiltrate. Chronic postsurgical changes are again seen on the left with volume loss. These changes are stable from the prior exam. Stable scoliosis is noted. IMPRESSION: No acute abnormality noted. Chronic changes are again seen on the left. Electronically Signed   By: Inez Catalina M.D.   On: 04/12/2020 15:20    Procedures Procedures   Medications Ordered in ED Medications  furosemide (LASIX) injection 40 mg (40 mg Intravenous Given 04/12/20 1659)    ED Course  I have reviewed the triage vital signs and the nursing notes.  Pertinent labs & imaging results that were available during my care of the patient were reviewed by me and considered in my medical decision making (see chart for details).  Clinical Course as of 04/13/20 0031  Tue Apr 13, 5834  [id number] 70 year old male with a history of congestive heart failure (EF of 25 to 30%) presented to ED with lower extremity edema, orthopnea,  scrotal swelling.  This been going on for some time but worsened in the past 4 days.  His BNP is elevated, but chronically elevated.  He does not currently have a cardiologist.  He takes Lasix, sporadically, because he was told he could have hurt his kidneys.  On exam appears comfortable.  Is not hypoxic.  He does have pitting edema to the mid thighs.  He has scrotal edema and penile edema.  No obstruction of the urethra.  He is breathing comfortably.  We will give a dose of IV diuresis.  Advised that he continue taking his Lasix daily, and not to miss any doses.  His kidney function is fine.  Also discussed importance of needing to see a cardiologist given how advanced his heart failure is.  We will place an ambulatory referral.  He verbalized understanding.  At this point I do think it is reasonable discharge home.  He has no chest pain, no evidence of acute coronary syndrome. [MT]    Clinical Course User Index [MT] Trifan, Carola Rhine, MD   MDM Rules/Calculators/A&P                          Pt's labs imaging reviewed, c/w chf exacerbation. Offered admission. Pt resistant to staying, stating he can eat and drink what he wants when he's home, not interested in staying. Bilateral scrotal hydroceles.  Review of chart reflecting last echo completed April 7619 with his systolic function severely reduced, ejection fraction 20 to 25%.  His vital signs here are stable, no hypoxia or tachypnea.  He was given IV lasix 40 mg.  Patient denies ever being evaluated by cardiology, he was given a referral for this.  He was strongly encouraged to  start back on his Lasix which will help with his edema.  We also discussed scrotal elevation, especially while sitting to help reduce the hydrocele.  He was given referral to cardiology for outpatient care, also advise close follow-up with his PCP for recheck if his symptoms are not improving by going back on his diuretic.  Strict return precautions were also discussed. Final  Clinical Impression(s) / ED Diagnoses Final diagnoses:  Acute on chronic congestive heart failure, unspecified heart failure type [facility])  Peripheral edema  Hydrocele in adult    Rx / DC Orders ED Discharge Orders    None       Landis Martins 04/13/20 0032    Milton Ferguson, MD 04/13/20 1735

## 2020-04-12 NOTE — ED Triage Notes (Addendum)
Swelling to scrotum x 3 days and swelling to bilateral upper legs for a while now.  States he has fluid pills and he is taking them as prescribed.

## 2020-04-19 ENCOUNTER — Encounter (HOSPITAL_COMMUNITY): Payer: Self-pay | Admitting: Radiology

## 2020-04-19 ENCOUNTER — Ambulatory Visit (HOSPITAL_COMMUNITY)
Admission: RE | Admit: 2020-04-19 | Discharge: 2020-04-19 | Disposition: A | Discharge status: Home / Self Care | Payer: Medicare Other | Source: Ambulatory Visit | Attending: Family Medicine | Admitting: Family Medicine

## 2020-04-19 ENCOUNTER — Other Ambulatory Visit: Payer: Self-pay

## 2020-04-19 DIAGNOSIS — R0602 Shortness of breath: Secondary | ICD-10-CM | POA: Diagnosis not present

## 2020-04-19 MED ORDER — IOHEXOL 300 MG/ML  SOLN
75.0000 mL | Freq: Once | INTRAMUSCULAR | Status: AC | PRN
  Administered 2020-04-19: 75 mL via INTRAVENOUS

## 2020-04-26 ENCOUNTER — Emergency Department (HOSPITAL_COMMUNITY): Payer: Medicare Other

## 2020-04-26 ENCOUNTER — Other Ambulatory Visit: Payer: Self-pay

## 2020-04-26 ENCOUNTER — Emergency Department (HOSPITAL_COMMUNITY)
Admission: EM | Admit: 2020-04-26 | Discharge: 2020-04-26 | Disposition: A | Discharge status: Home / Self Care | Payer: Medicare Other | Attending: Emergency Medicine | Admitting: Emergency Medicine

## 2020-04-26 ENCOUNTER — Encounter (HOSPITAL_COMMUNITY): Payer: Self-pay | Admitting: Emergency Medicine

## 2020-04-26 DIAGNOSIS — Z7951 Long term (current) use of inhaled steroids: Secondary | ICD-10-CM | POA: Insufficient documentation

## 2020-04-26 DIAGNOSIS — I509 Heart failure, unspecified: Secondary | ICD-10-CM | POA: Insufficient documentation

## 2020-04-26 DIAGNOSIS — J441 Chronic obstructive pulmonary disease with (acute) exacerbation: Secondary | ICD-10-CM | POA: Insufficient documentation

## 2020-04-26 DIAGNOSIS — J45909 Unspecified asthma, uncomplicated: Secondary | ICD-10-CM | POA: Diagnosis not present

## 2020-04-26 DIAGNOSIS — N433 Hydrocele, unspecified: Secondary | ICD-10-CM | POA: Diagnosis not present

## 2020-04-26 DIAGNOSIS — F17210 Nicotine dependence, cigarettes, uncomplicated: Secondary | ICD-10-CM | POA: Insufficient documentation

## 2020-04-26 DIAGNOSIS — Z7982 Long term (current) use of aspirin: Secondary | ICD-10-CM | POA: Diagnosis not present

## 2020-04-26 DIAGNOSIS — Z79899 Other long term (current) drug therapy: Secondary | ICD-10-CM | POA: Insufficient documentation

## 2020-04-26 DIAGNOSIS — I110 Hypertensive heart disease with heart failure: Secondary | ICD-10-CM | POA: Diagnosis not present

## 2020-04-26 DIAGNOSIS — N5089 Other specified disorders of the male genital organs: Secondary | ICD-10-CM | POA: Diagnosis present

## 2020-04-26 MED ORDER — FUROSEMIDE 10 MG/ML IJ SOLN
40.0000 mg | Freq: Once | INTRAMUSCULAR | Status: AC
  Administered 2020-04-26: 40 mg via INTRAVENOUS
  Filled 2020-04-26: qty 4

## 2020-04-26 NOTE — Discharge Instructions (Addendum)
Take meds as directed

## 2020-04-26 NOTE — ED Triage Notes (Signed)
Pt c/o scrotal swelling x 2 weeks. Pt has been seen for the same.

## 2020-04-26 NOTE — ED Notes (Signed)
Pt refusing lab work and vital signs. EDP made aware.

## 2020-04-26 NOTE — ED Provider Notes (Signed)
[facility] EMERGENCY DEPARTMENT Provider Note   CSN: [id number] Arrival date & time: 04/26/20  0115     History Chief Complaint  Patient presents with  . Testicle Pain    James Cervantes is a 70 y.o. male.  Pt presents to the ED today with testicle swelling, leg swelling and sob.  Pt was here on 4/12 for the same.  He had an ultrasound which showed bilateral large hydroceles.  Pt also has a hx of cardiomyopathy and CHF with last EF 20-25% on ECHO in April 2019.  Pt was given a referral to cardiology at that visit, but has not made an appt yet.  He seems to have very little insight and understanding to his disease.  Pt denies any current cp.  No sob now. Pt said he's been taking his meds, but does not know that names to any of them.        Past Medical History:  Diagnosis Date  . Anemia   . Arthritis   . Asthma   . Cardiomyopathy ([place])    a. 2014: EF 40-45%, diffuse HK, and Grade 2 DD  . Dyspnea   . Emphysema   . GERD (gastroesophageal reflux disease)   . Hip pain   . Hypertension   . Peripheral vascular disease ([place])   . Prostate disorder     Patient Active Problem List   Diagnosis Date Noted  . Noncompliance 12/16/2017  . Elevated troponin 12/16/2017  . Bronchitis 12/16/2017  . COPD with acute exacerbation ([place]) 12/15/2017  . Lipoma of extremity   . 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 Atypical chest pain 10/13/2012  . Chest pain 10/12/2012  . Abnormal EKG 10/12/2012  . GERD (gastroesophageal reflux disease) 10/12/2012  . UNSPECIFIED ANEMIA 05/18/2008  . ERECTILE DYSFUNCTION 09/25/2007  . UNS PULMONARY TB-CULT DX 06/19/2007  . UNDERWEIGHT 06/19/2007  . OSTEOARTHRITIS, HIP, RIGHT 02/18/2006  . HEPATITIS C 01/08/2006  . DISORDER, TOBACCO USE 01/08/2006  . MIGRAINE HEADACHE 01/08/2006  . ALLERGIC RHINITIS 01/08/2006  . GERD 01/08/2006  . BPH (benign prostatic hyperplasia) 01/08/2006  . OSTEOARTHRITIS 01/08/2006    Past Surgical History:  Procedure  Laterality Date  . ABDOMINAL SURGERY    . CATARACT EXTRACTION W/PHACO Left 11/26/2016   Procedure: CATARACT EXTRACTION PHACO AND INTRAOCULAR LENS PLACEMENT LEFT EYE;  Surgeon: Tonny Branch, MD;  Location: [facility];  Service: Ophthalmology;  Laterality: Left;  CDE: 26.53   . CATARACT EXTRACTION W/PHACO Right 01/28/2017   Procedure: CATARACT EXTRACTION PHACO AND INTRAOCULAR LENS PLACEMENT RIGHT EYE;  Surgeon: Tonny Branch, MD;  Location: [facility];  Service: Ophthalmology;  Laterality: Right;  CDE: 6.79  . HERNIA REPAIR    . LUNG SURGERY    . MASS EXCISION Right 11/01/2017   Procedure: EXCISION CYST RIGHT KNEE;  Surgeon: Aviva Signs, MD;  Location: [facility];  Service: General;  Laterality: Right;  Marland Kitchen VASCULAR SURGERY         Family History  Problem Relation Age of Onset  . Cancer Mother   . Hypertension Mother     Social History   Tobacco Use  . Smoking status: Current Every Day Smoker    Packs/day: 0.50    Years: 50.00    Pack years: 25.00    Types: Cigarettes  . Smokeless tobacco: Never Used  Vaping Use  . Vaping Use: Never used  Substance Use Topics  . Alcohol use: No  . Drug use: No    Home Medications Prior to Admission medications  Medication Sig Start Date End Date Taking? Authorizing Provider  albuterol (PROAIR HFA) 108 (90 Base) MCG/ACT inhaler Inhale 2 puffs into the lungs every 4 (four) hours as needed for wheezing or shortness of breath. 12/16/17   Johnson, Clanford L, MD  ANORO ELLIPTA 62.5-25 MCG/INH AEPB Inhale 1 puff into the lungs daily. 02/17/20   [provider]  aspirin 325 MG tablet Take 1 tablet (325 mg total) by mouth daily. Patient not taking: No sig reported 12/17/17   Irwin Brakeman L, MD  atorvastatin (LIPITOR) 40 MG tablet Take 1 tablet (40 mg total) by mouth daily at 6 PM. Patient not taking: Reported on 03/06/2020 12/16/17 01/15/18  Murlean Iba, MD  cholecalciferol (VITAMIN D) 25 MCG (1000 UNIT) tablet Take 1,000 Units by mouth daily.     [provider]  digoxin (LANOXIN) 0.125 MG tablet Take 125 mcg by mouth daily. 02/05/20   [provider]  furosemide (LASIX) 40 MG tablet Take 1 tablet (40 mg total) by mouth daily. 04/06/17 04/06/18  Sinda Du, MD  losartan (COZAAR) 25 MG tablet Take 1 tablet (25 mg total) by mouth daily. 04/06/17   Sinda Du, MD  methocarbamol (ROBAXIN) 500 MG tablet Take 1 tablet (500 mg total) by mouth 2 (two) times daily as needed for muscle spasms. Patient not taking: No sig reported 03/31/19   Petrucelli, Samantha R, PA-C  metoprolol succinate (TOPROL-XL) 25 MG 24 hr tablet Take 0.5 tablets (12.5 mg total) by mouth daily. 04/06/17   Sinda Du, MD  Omeprazole 20 MG TBEC Take 1 tablet (20 mg total) by mouth daily. Patient not taking: Reported on 03/06/2020 12/16/17 01/15/18  Irwin Brakeman L, MD  oxyCODONE (OXY IR/ROXICODONE) 5 MG immediate release tablet Take 5 mg by mouth at bedtime.     [provider]  pantoprazole (PROTONIX) 40 MG tablet Take 40 mg by mouth daily. 02/22/20   [provider]  potassium chloride (K-DUR) 10 MEQ tablet Take 1 tablet (10 mEq total) by mouth daily. Patient not taking: Reported on 03/06/2020 04/06/17   Sinda Du, MD  predniSONE (DELTASONE) 10 MG tablet Take 4 tablets (40 mg total) by mouth daily. Patient not taking: No sig reported 03/06/20   Fredia Sorrow, MD  predniSONE (DELTASONE) 20 MG tablet Take by mouth. Patient not taking: No sig reported 08/20/19   [provider]  sildenafil (VIAGRA) 100 MG tablet Take 1 tablet (100 mg total) by mouth daily as needed. 09/15/19   Franchot Gallo, MD  tamsulosin (FLOMAX) 0.4 MG CAPS capsule Take 1 capsule (0.4 mg total) by mouth at bedtime. Patient not taking: No sig reported 09/15/19   Franchot Gallo, MD  traMADol (ULTRAM) 50 MG tablet Take 1 tablet (50 mg total) by mouth every 6 (six) hours as needed for moderate pain. Patient not taking: Reported on 04/12/2020 11/01/17    Aviva Signs, MD  TRELEGY ELLIPTA 100-62.5-25 MCG/INH AEPB Inhale 1 puff into the lungs daily. Patient not taking: No sig reported 08/17/19   [provider]    Allergies    Patient has no known allergies.  Review of Systems   Review of Systems  Respiratory: Positive for shortness of breath.   Cardiovascular: Positive for leg swelling.  Genitourinary: Positive for penile swelling and scrotal swelling.  All other systems reviewed and are negative.   Physical Exam Updated Vital Signs BP 127/87 (BP Location: Right Arm)   Pulse 63   Temp 98.1 F (36.7 C) (Oral)   Resp 16  Ht 5\' 6"  (1.676 m)   Wt 52 kg   SpO2 100%   BMI 18.50 kg/m   Physical Exam Vitals and nursing note reviewed.  Constitutional:      Appearance: Normal appearance.  HENT:     Head: Normocephalic and atraumatic.     Right Ear: External ear normal.     Left Ear: External ear normal.     Nose: Nose normal.     Mouth/Throat:     Mouth: Mucous membranes are moist.     Pharynx: Oropharynx is clear.  Eyes:     Extraocular Movements: Extraocular movements intact.     Conjunctiva/sclera: Conjunctivae normal.     Pupils: Pupils are equal, round, and reactive to light.  Cardiovascular:     Rate and Rhythm: Normal rate and regular rhythm.     Pulses: Normal pulses.     Heart sounds: Normal heart sounds.  Pulmonary:     Effort: Pulmonary effort is normal.     Breath sounds: Normal breath sounds.  Abdominal:     General: Abdomen is flat. Bowel sounds are normal.     Palpations: Abdomen is soft.  Genitourinary:    Comments: Scrotal and penile swelling.  No redness. Musculoskeletal:     Cervical back: Normal range of motion and neck supple.     Right lower leg: Edema present.     Left lower leg: Edema present.  Skin:    General: Skin is warm.     Capillary Refill: Capillary refill takes less than 2 seconds.  Neurological:     General: No focal deficit present.     Mental Status: He is alert and  oriented to person, place, and time.  Psychiatric:        Mood and Affect: Mood normal.        Behavior: Behavior normal.        Thought Content: Thought content normal.        Judgment: Judgment normal.     ED Results / Procedures / Treatments   Labs (all labs ordered are listed, but only abnormal results are displayed) Labs Reviewed  BASIC METABOLIC PANEL  BRAIN NATRIURETIC PEPTIDE  HEPATIC FUNCTION PANEL  DIGOXIN LEVEL  CBC WITH DIFFERENTIAL/PLATELET  TROPONIN I (HIGH SENSITIVITY)    EKG EKG Interpretation  Date/Time:  Tuesday April 26 2020 08:27:50 EDT Ventricular Rate:  64 PR Interval:  171 QRS Duration: 101 QT Interval:  389 QTC Calculation: 402 R Axis:   72 Text Interpretation: Sinus rhythm Anterior infarct, old Repol abnrm suggests ischemia, lateral leads No significant change since last tracing Confirmed by Isla Pence [phone]) on 04/26/2020 9:31:28 AM   Radiology DG Chest Port 1 View  Result Date: 04/26/2020 CLINICAL DATA:  Shortness of breath 0 score swelling for 2 weeks Emphysema, asthma, smoking, left lobectomy EXAM: PORTABLE CHEST 1 VIEW COMPARISON:  04/12/2020 FINDINGS: Unchanged chronic opacification of the left hemithorax. There has been interval increase of patchy opacities at the right lung base and linear opacities in the right upper lung. This may be due to developing edema or pneumonia. S-shaped scoliosis of the thoracolumbar spine is again seen. IMPRESSION: Increasing right lung opacities suspicious for pneumonia or possibly pulmonary edema. Electronically Signed   By: Miachel Roux M.D.   On: 04/26/2020 08:13    Procedures Procedures   Medications Ordered in ED Medications  furosemide (LASIX) injection 40 mg (40 mg Intravenous Given 04/26/20 0830)    ED Course  I have reviewed the triage  vital signs and the nursing notes.  Pertinent labs & imaging results that were available during my care of the patient were reviewed by me and considered in  my medical decision making (see chart for details).    MDM Rules/Calculators/A&P                          Pt let the nurse get the IV and given him lasix.  She was unable to draw back any blood and refused any other blood draws.  I spoke to him and he said he is ready to go home.  I put in another referral to cards/urology because he said he has not heard anything from them.  However, on his AVS, he has an appt with cards in June and with urology in September.   Pt is encouraged to take his meds as directed.  Return if worse.   Final Clinical Impression(s) / ED Diagnoses Final diagnoses:  Acute congestive heart failure, unspecified heart failure type ([place])  Hydrocele, unspecified hydrocele type    Rx / DC Orders ED Discharge Orders         Ordered    Ambulatory referral to Cardiology        04/26/20 1026    Ambulatory referral to Urology        04/26/20 1026           Isla Pence, MD 04/26/20 1029

## 2020-05-10 ENCOUNTER — Ambulatory Visit (INDEPENDENT_AMBULATORY_CARE_PROVIDER_SITE_OTHER): Payer: Medicare Other | Admitting: Urology

## 2020-05-10 ENCOUNTER — Other Ambulatory Visit: Payer: Self-pay

## 2020-05-10 ENCOUNTER — Encounter: Payer: Self-pay | Admitting: Urology

## 2020-05-10 VITALS — BP 138/79 | HR 82

## 2020-05-10 DIAGNOSIS — N433 Hydrocele, unspecified: Secondary | ICD-10-CM | POA: Diagnosis not present

## 2020-05-10 DIAGNOSIS — I509 Heart failure, unspecified: Secondary | ICD-10-CM

## 2020-05-10 DIAGNOSIS — N5089 Other specified disorders of the male genital organs: Secondary | ICD-10-CM | POA: Diagnosis not present

## 2020-05-10 LAB — MICROSCOPIC EXAMINATION
Epithelial Cells (non renal): NONE SEEN /HPF (ref 0–10)
Renal Epithel, UA: NONE SEEN /HPF
WBC, UA: NONE SEEN /HPF (ref 0–5)

## 2020-05-10 LAB — URINALYSIS, ROUTINE W REFLEX MICROSCOPIC
Bilirubin, UA: NEGATIVE
Glucose, UA: NEGATIVE
Ketones, UA: NEGATIVE
Leukocytes,UA: NEGATIVE
Nitrite, UA: NEGATIVE
Specific Gravity, UA: >1.03 — ABNORMAL HIGH (ref 1.005–1.030)
Urobilinogen, Ur: 2 mg/dL — ABNORMAL HIGH (ref 0.2–1.0)
pH, UA: 6 (ref 5.0–7.5)

## 2020-05-10 NOTE — Progress Notes (Signed)
History of Present Illness: [name] returns today for scrotal swelling.  This is been going on for about 3 months.  He has been seen by Dr. Cindie Laroche several times for this.  He is illiterate.  He does have a prescription for furosemide to 40 mg.  Unfortunately, he says he cannot read his medication labels and is not sure he is taking this.  He has been to the ER multiple times for lower extremity and scrotal swelling.  Scrotal swelling is somewhat uncomfortable for him and limits his ambulation.  He has not seen a cardiologist for his congestive heart failure.  He does have paroxysmal nocturnal dyspnea.  Past Medical History:  Diagnosis Date  . Anemia   . Arthritis   . Asthma   . Cardiomyopathy ([place])    a. 2014: EF 40-45%, diffuse HK, and Grade 2 DD  . Dyspnea   . Emphysema   . GERD (gastroesophageal reflux disease)   . Hip pain   . Hypertension   . Peripheral vascular disease ([place])   . Prostate disorder     Past Surgical History:  Procedure Laterality Date  . ABDOMINAL SURGERY    . CATARACT EXTRACTION W/PHACO Left 11/26/2016   Procedure: CATARACT EXTRACTION PHACO AND INTRAOCULAR LENS PLACEMENT LEFT EYE;  Surgeon: Tonny Branch, MD;  Location: [facility];  Service: Ophthalmology;  Laterality: Left;  CDE: 26.53   . CATARACT EXTRACTION W/PHACO Right 01/28/2017   Procedure: CATARACT EXTRACTION PHACO AND INTRAOCULAR LENS PLACEMENT RIGHT EYE;  Surgeon: Tonny Branch, MD;  Location: [facility];  Service: Ophthalmology;  Laterality: Right;  CDE: 6.79  . HERNIA REPAIR    . LUNG SURGERY    . MASS EXCISION Right 11/01/2017   Procedure: EXCISION CYST RIGHT KNEE;  Surgeon: Aviva Signs, MD;  Location: [facility];  Service: General;  Laterality: Right;  Marland Kitchen VASCULAR SURGERY      Home Medications:  Allergies as of 05/10/2020   No Known Allergies     Medication List       Accurate as of May 10, 2020  8:18 AM. If you have any questions, ask your nurse or doctor.        albuterol 108 (90 Base) MCG/ACT  inhaler Commonly known as: ProAir HFA Inhale 2 puffs into the lungs every 4 (four) hours as needed for wheezing or shortness of breath.   Anoro Ellipta 62.5-25 MCG/INH Aepb Generic drug: umeclidinium-vilanterol Inhale 1 puff into the lungs daily.   aspirin 325 MG tablet Take 1 tablet (325 mg total) by mouth daily.   atorvastatin 40 MG tablet Commonly known as: LIPITOR Take 1 tablet (40 mg total) by mouth daily at 6 PM.   cholecalciferol 25 MCG (1000 UNIT) tablet Commonly known as: VITAMIN D Take 1,000 Units by mouth daily.   digoxin 0.125 MG tablet Commonly known as: LANOXIN Take 125 mcg by mouth daily.   furosemide 40 MG tablet Commonly known as: Lasix Take 1 tablet (40 mg total) by mouth daily.   losartan 25 MG tablet Commonly known as: COZAAR Take 1 tablet (25 mg total) by mouth daily.   methocarbamol 500 MG tablet Commonly known as: ROBAXIN Take 1 tablet (500 mg total) by mouth 2 (two) times daily as needed for muscle spasms.   metoprolol succinate 25 MG 24 hr tablet Commonly known as: TOPROL-XL Take 0.5 tablets (12.5 mg total) by mouth daily.   Omeprazole 20 MG Tbec Take 1 tablet (20 mg total) by mouth daily.   oxyCODONE 5 MG  immediate release tablet Commonly known as: Oxy IR/ROXICODONE Take 5 mg by mouth at bedtime.   pantoprazole 40 MG tablet Commonly known as: PROTONIX Take 40 mg by mouth daily.   potassium chloride 10 MEQ tablet Commonly known as: KLOR-CON Take 1 tablet (10 mEq total) by mouth daily.   predniSONE 20 MG tablet Commonly known as: DELTASONE Take by mouth.   predniSONE 10 MG tablet Commonly known as: DELTASONE Take 4 tablets (40 mg total) by mouth daily.   sildenafil 100 MG tablet Commonly known as: VIAGRA Take 1 tablet (100 mg total) by mouth daily as needed.   tamsulosin 0.4 MG Caps capsule Commonly known as: FLOMAX Take 1 capsule (0.4 mg total) by mouth at bedtime.   traMADol 50 MG tablet Commonly known as: ULTRAM Take 1  tablet (50 mg total) by mouth every 6 (six) hours as needed for moderate pain.   Trelegy Ellipta 100-62.5-25 MCG/INH Aepb Generic drug: Fluticasone-Umeclidin-Vilant Inhale 1 puff into the lungs daily.       Allergies: No Known Allergies  Family History  Problem Relation Age of Onset  . Cancer Mother   . Hypertension Mother     Social History:  reports that he has been smoking cigarettes. He has a 25.00 pack-year smoking history. He has never used smokeless tobacco. He reports that he does not drink alcohol and does not use drugs.  ROS: A complete review of systems was performed.  All systems are negative except for pertinent findings as noted.  Physical Exam:  Vital signs in last 24 hours: There were no vitals taken for this visit. Constitutional:  Alert and oriented, No acute distress Cardiovascular: Bilateral lower extremity brawny edema. Respiratory: Normal respiratory effort GI: Abdomen is soft, nontender, nondistended, no abdominal masses. No CVAT.  Genitourinary: Phallus uncircumcised.  Significant penile edema.  Significant scrotal edema.  No erythema of skin, no crepitus.  Testicles difficult to palpate. Neurologic: Grossly intact, no focal deficits Psychiatric: Normal mood and affect  Laboratory Data:  No results for input(s): WBC, HGB, HCT, PLT in the last 72 hours.  No results for input(s): NA, K, CL, GLUCOSE, BUN, CALCIUM, CREATININE in the last 72 hours.  Invalid input(s): CO3   No results found for this or any previous visit (from the past 24 hour(s)). No results found for this or any previous visit (from the past 240 hour(s)).  Renal Function: No results for input(s): CREATININE in the last 168 hours. CrCl cannot be calculated (Patient's most recent lab result is older than the maximum 21 days allowed.).  Radiologic Imaging: No results found.  Impression/Assessment:  CHF with significant lower extremity/genital edema  Plan:  1.  He will come in  later today with his medications- my nurses will show him his furosemide and direct him to take this in the morning and early in the afternoon  2.  I will see if we can move the patient's cardiology appointment up to be seen sooner  3.  The patient more than likely will need social work to assist with his at home care

## 2020-05-10 NOTE — Progress Notes (Signed)
Urological Symptom Review  Patient is experiencing the following symptoms: Scrotal swelling   Review of Systems  Gastrointestinal (upper)  : Negative for upper GI symptoms  Gastrointestinal (lower) : Constipation  Constitutional : Negative for symptoms  Skin: Negative for skin symptoms  Eyes: Negative for eye symptoms  Ear/Nose/Throat : Negative for Ear/Nose/Throat symptoms  Hematologic/Lymphatic: Negative for Hematologic/Lymphatic symptoms  Cardiovascular : Leg swelling  Respiratory : Shortness of breath  Endocrine: Negative for endocrine symptoms  Musculoskeletal: Negative for musculoskeletal symptoms  Neurological: Negative for neurological symptoms  Psychologic: Negative for psychiatric symptoms

## 2020-05-11 NOTE — Progress Notes (Signed)
Cardiology Office Note    Date:  05/16/2020   ID:  James Cervantes, DOB 03-22-1950, MRN [id number]   PCP:  Lucia Gaskins, [place]  Cardiologist:  Rozann Lesches, MD  Advanced Practice Provider:  No care team member to display Electrophysiologist:  None   [phone]   Chief Complaint  Patient presents with  . Follow-up    History of Present Illness:  James Cervantes is a 70 y.o. male with history of cardiomyopathy ejection fraction 40 to 45% on echo 2014, hypertension, COPD, partial lung resection, tobacco abuse.An echocardiogram 2014 was obtained and showed a reduced EF of 40-45% with diffuse hypokinesis and grade 2 diastolic dysfunction. His cardiomyopathy was thought to possibly be nonischemic in the setting of prior alcohol abuse. He was not started on beta-blocker therapy due to resting bradycardia    Patient was last seen by [place] when hospitalized for acute on chronic systolic and diastolic CHF 3/0/8657.  Echo showed LVEF 20 to 25% with diffuse hypokinesis and grade 2 DD.  Patient never followed up with [place].  Patient went to the ED 04/26/2020 with testicular swelling leg swelling and shortness of breath.  Ultrasound showed bilateral large hydroceles.  He was given IV Lasix and followed up with urology 05/10/2020.  He had no idea what medications he was taking and was supposed to go back to instruct him on correct Lasix dose.  Patient comes in for f/u. Complains of shortness of breath with little activity. Occasional palpitations. No chest pain. Eats canned meats, canned foods. Can't read and says he guesses a lot on things.  Previously on losartan but hasn't picked up since the fall. Smokes 1 cigarette daily. No alcohol.    Past Medical History:  Diagnosis Date  . Anemia   . Arthritis   . Asthma   . Cardiomyopathy ([place])    a. 2014: EF 40-45%, diffuse HK, and Grade 2 DD  . Dyspnea   . Emphysema   . GERD (gastroesophageal reflux disease)   .  Hip pain   . Hypertension   . Peripheral vascular disease ([place])   . Prostate disorder     Past Surgical History:  Procedure Laterality Date  . ABDOMINAL SURGERY    . CATARACT EXTRACTION W/PHACO Left 11/26/2016   Procedure: CATARACT EXTRACTION PHACO AND INTRAOCULAR LENS PLACEMENT LEFT EYE;  Surgeon: Tonny Branch, MD;  Location: [facility];  Service: Ophthalmology;  Laterality: Left;  CDE: 26.53   . CATARACT EXTRACTION W/PHACO Right 01/28/2017   Procedure: CATARACT EXTRACTION PHACO AND INTRAOCULAR LENS PLACEMENT RIGHT EYE;  Surgeon: Tonny Branch, MD;  Location: [facility];  Service: Ophthalmology;  Laterality: Right;  CDE: 6.79  . HERNIA REPAIR    . LUNG SURGERY    . MASS EXCISION Right 11/01/2017   Procedure: EXCISION CYST RIGHT KNEE;  Surgeon: Aviva Signs, MD;  Location: [facility];  Service: General;  Laterality: Right;  Marland Kitchen VASCULAR SURGERY      Current Medications: Current Meds  Medication Sig  . albuterol (PROAIR HFA) 108 (90 Base) MCG/ACT inhaler Inhale 2 puffs into the lungs every 4 (four) hours as needed for wheezing or shortness of breath.  Jearl Klinefelter ELLIPTA 62.5-25 MCG/INH AEPB Inhale 1 puff into the lungs daily.  Marland Kitchen aspirin 81 MG chewable tablet Chew 81 mg by mouth daily.  . digoxin (LANOXIN) 0.125 MG tablet Take 125 mcg by mouth daily.  . furosemide (LASIX) 40 MG tablet Take 40 mg by mouth  2 (two) times daily.  Marland Kitchen losartan (COZAAR) 25 MG tablet Take 1 tablet (25 mg total) by mouth daily.  . metoprolol succinate (TOPROL-XL) 25 MG 24 hr tablet Take 25 mg by mouth daily.  Marland Kitchen oxyCODONE (OXY IR/ROXICODONE) 5 MG immediate release tablet Take 5 mg by mouth at bedtime.   . pantoprazole (PROTONIX) 40 MG tablet Take 40 mg by mouth daily.  . sildenafil (VIAGRA) 100 MG tablet Take 1 tablet (100 mg total) by mouth daily as needed.  Marland Kitchen spironolactone (ALDACTONE) 25 MG tablet Take 1 tablet by mouth daily.  . TRELEGY ELLIPTA 100-62.5-25 MCG/INH AEPB Inhale 1 puff into the lungs daily.  . [DISCONTINUED]  furosemide (LASIX) 40 MG tablet Take 1 tablet (40 mg total) by mouth daily.     Allergies:   Patient has no known allergies.   Social History   Socioeconomic History  . Marital status: Legally Separated    Spouse name: Not on file  . Number of children: Not on file  . Years of education: Not on file  . Highest education level: Not on file  Occupational History  . Not on file  Tobacco Use  . Smoking status: Current Every Day Smoker    Packs/day: 0.50    Years: 50.00    Pack years: 25.00    Types: Cigarettes  . Smokeless tobacco: Never Used  Vaping Use  . Vaping Use: Never used  Substance and Sexual Activity  . Alcohol use: No  . Drug use: No  . Sexual activity: Yes    Birth control/protection: Condom  Other Topics Concern  . Not on file  Social History Narrative  . Not on file   Social Determinants of Health   Financial Resource Strain: Not on file  Food Insecurity: Not on file  Transportation Needs: Not on file  Physical Activity: Not on file  Stress: Not on file  Social Connections: Not on file     Family History:  The patient's family history includes CVA in his brother; Cancer in his mother; Hypertension in his mother.   ROS:   Please see the history of present illness.    ROS All other systems reviewed and are negative.   PHYSICAL EXAM:   VS:  BP 102/60   Pulse 80   Ht '5\' 6"'  (1.676 m)   Wt 124 lb (56.2 kg)   SpO2 96%   BMI 20.01 kg/m   Physical Exam  GEN: Thin, elderly, in no acute distress  Neck: Increased JVD, no carotid bruits, or masses Cardiac:RRR; positive S4 2/6 systolic murmur at left sternal border Respiratory: Decreased breath sounds at left lung base with basilar rales  GI: soft, nontender, nondistended, + BS Ext: +1 edema bilaterally otherwise without cyanosis, clubbing, Good distal pulses bilaterally Neuro:  Alert and Oriented x 3, Psych: euthymic mood, full affect  Wt Readings from Last 3 Encounters:  05/16/20 124 lb (56.2 kg)   04/26/20 114 lb 10.2 oz (52 kg)  03/06/20 113 lb (51.3 kg)      Studies/Labs Reviewed:   EKG:  EKG is not ordered today.    Recent Labs: 03/06/2020: ALT 18 04/12/2020: B Natriuretic Peptide 2,972.0; BUN 20; Creatinine, Ser 1.08; Hemoglobin 11.8; Platelets 284; Potassium 4.1; Sodium 140   Lipid Panel    Component Value Date/Time   CHOL 118 12/16/2017 0504   TRIG 31 12/16/2017 0504   HDL 71 12/16/2017 0504   CHOLHDL 1.7 12/16/2017 0504   VLDL 6 12/16/2017 0504   LDLCALC  41 12/16/2017 0504    Additional studies/ records that were reviewed today include:  2D echo 2019 Study Conclusions   - Left ventricle: The cavity size was mildly dilated. Wall    thickness was increased in a pattern of mild LVH. Systolic    function was severely reduced. The estimated ejection fraction    was in the range of 20% to 25%. Diffuse hypokinesis with regional    variation. Features are consistent with a pseudonormal left    ventricular filling pattern, with concomitant abnormal relaxation    and increased filling pressure (grade 2 diastolic dysfunction).  - Aortic valve: There was mild regurgitation.  - Mitral valve: Mildly thickened leaflets . There was mild    regurgitation.  - Left atrium: The atrium was at the upper limits of normal in    size.  - Right ventricle: Systolic function was moderately reduced.  - Right atrium: Central venous pressure (est): 3 mm Hg.  - Tricuspid valve: There was mild regurgitation.  - Pulmonic valve: There was mild regurgitation.  - Pulmonary arteries: Systolic pressure was moderately increased.    PA peak pressure: 42 mm Hg (S).  - Pericardium, extracardiac: A small pericardial effusion was    identified posterior to the heart.  Echocardiogram: 10/13/2012 Study Conclusions  - Left ventricle: The cavity size was at the upper limits of   normal. Wall thickness was normal. Systolic function was   mildly to moderately reduced. The estimated ejection   fraction  was in the range of 40% to 45%. Diffuse   hypokinesis. There is more prominent hypokinesis of the   inferolateral myocardium. Features are consistent with a   pseudonormal left ventricular filling pattern, with   concomitant abnormal relaxation and increased filling   pressure (grade 2 diastolic dysfunction). - Mitral valve: Mildly thickened leaflets . Trivial   regurgitation. - Left atrium: The atrium was at the upper limits of normal   in size. - Right atrium: Central venous pressure: 84m Hg (est). - Tricuspid valve: Mild regurgitation. - Pulmonary arteries: PA peak pressure: 211mHg (S). - Pericardium, extracardiac: A trivial pericardial effusion   was identified. Impressions:  - No prior study for comparison. Upper normal LV chamber   size with LVEF 40-45%, diffuse hypokinesis - most   prominent in the inferolateral wall, grade 2 diastolic   dysfunction. Upper normal left atrial size. Mild tricuspid   regurgitation with PASP 28 mmHg. Trivial pericardial   effusion.    Risk Assessment/Calculations:         ASSESSMENT:    1. [place] ([place]  2. Acute on chronic combined systolic and diastolic CHF (congestive heart failure) ([place]  3. History of COPD   4. Tobacco abuse   5. History of bradycardia      PLAN:  In order of problems listed above:  Presumed nonischemic cardiomyopathy ejection fraction 20 to 25% on echo in 2019 has never followed up with [place] the past.  We will repeat echo.  Acute on chronic systolic and diastolic CHF patient currently taking Lasix 20 mg 2 tablets twice daily.  The only way he knows how to take Lasix as somebody marked in blue ink.  He is illiterate and cannot read any of his medications.  I will reach out to social workers to see if they can help and also have his pharmacy create a pill packet for him.  2 g sodium diet discussed with him.  He cannot read it if  we give it to him.  A nutrition consult would also help.   Continue current dose Lasix.  Add losartan 25 mg once daily.  Check be met in 1 week.  COPD status post partial lung resection  Tobacco abuse smokes 1 cigarette daily.  History of bradycardia and beta-blockers avoided in the past but currently on metoprolol 25 mg once daily.  Shared Decision Making/Informed Consent        Medication Adjustments/Labs and Tests Ordered: Current medicines are reviewed at length with the patient today.  Concerns regarding medicines are outlined above.  Medication changes, Labs and Tests ordered today are listed in the Patient Instructions below. Patient Instructions  Medication Instructions:  START Losartan 25 mg daily   *If you need a refill on your cardiac medications before your next appointment, please call your pharmacy*   Lab Work: BMET in 2 weeks  ( 05/31/20)   If you have labs (blood work) drawn today and your tests are completely normal, you will receive your results only by: Marland Kitchen MyChart Message (if you have MyChart) OR . A paper copy in the mail If you have any lab test that is abnormal or we need to change your treatment, we will call you to review the results.   Testing/Procedures: Your physician has requested that you have an echocardiogram. Echocardiography is a painless test that uses sound waves to create images of your heart. It provides your doctor with information about the size and shape of your heart and how well your heart's chambers and valves are working. This procedure takes approximately one hour. There are no restrictions for this procedure.     Follow-Up: At [facility], you and your health needs are our priority.  As part of our continuing mission to provide you with exceptional heart care, we have created designated Provider Care Teams.  These Care Teams include your primary Cardiologist (physician) and Advanced Practice Providers (APPs -  Physician Assistants and Nurse Practitioners) who all work together to provide  you with the care you need, when you need it.  We recommend signing up for the patient portal called "MyChart".  Sign up information is provided on this After Visit Summary.  MyChart is used to connect with patients for Virtual Visits (Telemedicine).  Patients are able to view lab/test results, encounter notes, upcoming appointments, etc.  Non-urgent messages can be sent to your provider as well.   To learn more about what you can do with MyChart, go to [email or web address].    Your next appointment:   6 week(s)  The format for your next appointment:   In Person  Provider:   Ermalinda Barrios, PA-C   Other Instructions I placed a referral to social work. They will call you to make an appointment.     Sumner Boast, PA-C  05/16/2020 12:52 PM    [facility] [address], [place], [place]  [address] Phone: [phone]; Fax: [phone]

## 2020-05-16 ENCOUNTER — Ambulatory Visit (INDEPENDENT_AMBULATORY_CARE_PROVIDER_SITE_OTHER): Payer: Medicare Other | Admitting: Physician Assistant

## 2020-05-16 ENCOUNTER — Other Ambulatory Visit: Payer: Self-pay

## 2020-05-16 ENCOUNTER — Telehealth: Payer: Self-pay | Admitting: Licensed Clinical Social Worker

## 2020-05-16 ENCOUNTER — Encounter: Payer: Self-pay | Admitting: Physician Assistant

## 2020-05-16 VITALS — BP 102/60 | HR 80 | Ht 66.0 in | Wt 124.0 lb

## 2020-05-16 DIAGNOSIS — I5043 Acute on chronic combined systolic (congestive) and diastolic (congestive) heart failure: Secondary | ICD-10-CM

## 2020-05-16 DIAGNOSIS — Z8709 Personal history of other diseases of the respiratory system: Secondary | ICD-10-CM

## 2020-05-16 DIAGNOSIS — Z87898 Personal history of other specified conditions: Secondary | ICD-10-CM

## 2020-05-16 DIAGNOSIS — I429 Cardiomyopathy, unspecified: Secondary | ICD-10-CM

## 2020-05-16 DIAGNOSIS — Z720 Tobacco use: Secondary | ICD-10-CM | POA: Diagnosis not present

## 2020-05-16 MED ORDER — LOSARTAN POTASSIUM 25 MG PO TABS: 25.0000 mg | ORAL_TABLET | Freq: Every day | ORAL | 3 refills | Status: DC

## 2020-05-16 NOTE — Telephone Encounter (Signed)
CSW consulted to help out with case management concerns (pt illiterate and needs help with medication management, remembering appts, diet education, home checks).  CSW spoke with pt who acknowledges he would benefit from help with medication management in particular.  Discussed referral to [facility] program where a local paramedic would visit pt on regular basis to do general medical check and help with medication management  CSW sent referral form to pt cardiology office to be completed and turned submitted.  Will continue to follow and assist as needed  Jorge Ny, [facility] Desk#: [phone] Cell#: [phone]

## 2020-05-16 NOTE — Patient Instructions (Addendum)
Medication Instructions:  START Losartan 25 mg daily   *If you need a refill on your cardiac medications before your next appointment, please call your pharmacy*   Lab Work: BMET in 2 weeks  ( 05/31/20)   If you have labs (blood work) drawn today and your tests are completely normal, you will receive your results only by: Marland Kitchen MyChart Message (if you have MyChart) OR . A paper copy in the mail If you have any lab test that is abnormal or we need to change your treatment, we will call you to review the results.   Testing/Procedures: Your physician has requested that you have an echocardiogram. Echocardiography is a painless test that uses sound waves to create images of your heart. It provides your doctor with information about the size and shape of your heart and how well your heart's chambers and valves are working. This procedure takes approximately one hour. There are no restrictions for this procedure.     Follow-Up: At [facility], you and your health needs are our priority.  As part of our continuing mission to provide you with exceptional heart care, we have created designated Provider Care Teams.  These Care Teams include your primary Cardiologist (physician) and Advanced Practice Providers (APPs -  Physician Assistants and Nurse Practitioners) who all work together to provide you with the care you need, when you need it.  We recommend signing up for the patient portal called "MyChart".  Sign up information is provided on this After Visit Summary.  MyChart is used to connect with patients for Virtual Visits (Telemedicine).  Patients are able to view lab/test results, encounter notes, upcoming appointments, etc.  Non-urgent messages can be sent to your provider as well.   To learn more about what you can do with MyChart, go to [email or web address].    Your next appointment:   6 week(s)  The format for your next appointment:   In Person  Provider:   Ermalinda Barrios,  PA-C   Other Instructions I placed a referral to social work. They will call you to make an appointment.

## 2020-05-16 NOTE — Progress Notes (Signed)
I spoke with pharmacist at [facility] and medication list is updated.

## 2020-05-25 ENCOUNTER — Ambulatory Visit (HOSPITAL_COMMUNITY)
Admission: RE | Admit: 2020-05-25 | Discharge: 2020-05-25 | Disposition: A | Discharge status: Home / Self Care | Payer: Medicare Other | Source: Ambulatory Visit | Attending: Physician Assistant | Admitting: Physician Assistant

## 2020-05-25 ENCOUNTER — Other Ambulatory Visit: Payer: Self-pay

## 2020-05-25 DIAGNOSIS — I5043 Acute on chronic combined systolic (congestive) and diastolic (congestive) heart failure: Secondary | ICD-10-CM | POA: Diagnosis present

## 2020-05-25 DIAGNOSIS — I428 Other cardiomyopathies: Secondary | ICD-10-CM | POA: Diagnosis not present

## 2020-05-25 DIAGNOSIS — I429 Cardiomyopathy, unspecified: Secondary | ICD-10-CM | POA: Insufficient documentation

## 2020-05-25 LAB — ECHOCARDIOGRAM COMPLETE
Area-P 1/2: 4.86 cm2
Calc EF: 30 %
P 1/2 time: 425 msec
S' Lateral: 5.6 cm
Single Plane A2C EF: 31.2 %
Single Plane A4C EF: 27.3 %

## 2020-05-31 ENCOUNTER — Ambulatory Visit: Payer: Medicare Other | Admitting: Urology

## 2020-06-03 ENCOUNTER — Ambulatory Visit: Payer: Medicare Other | Admitting: Cardiology

## 2020-06-07 ENCOUNTER — Ambulatory Visit: Payer: Medicare Other | Admitting: Urology

## 2020-06-21 NOTE — Progress Notes (Signed)
Cardiology Office Note    Date:  06/27/2020   ID:  James Cervantes, DOB 03-Feb-1950, MRN [id number]   PCP:  Lucia Gaskins, MD   [facility]  Cardiologist:  Rozann Lesches, MD   Advanced Practice Provider:  No care team member to display Electrophysiologist:  None   [phone]   Chief Complaint  Patient presents with   Follow-up     History of Present Illness:  James Cervantes is a 70 y.o. male with history of cardiomyopathy ejection fraction 40 to 45% on echo 2014, hypertension, COPD, partial lung resection, tobacco abuse.An echocardiogram 2014 was obtained and showed a reduced EF of 40-45% with diffuse hypokinesis and grade 2 diastolic dysfunction. His cardiomyopathy was thought to possibly be nonischemic in the setting of prior alcohol abuse. He was not started on beta-blocker therapy due to resting bradycardia    Patient was last seen by [place] when hospitalized for acute on chronic systolic and diastolic CHF 06/08/3417.  Echo showed LVEF 20 to 25% with diffuse hypokinesis and grade 2 DD.  Patient never followed up with [place].  Patient went to the ED 04/26/2020 with testicular swelling leg swelling and shortness of breath.  Ultrasound showed bilateral large hydroceles.  He was given IV Lasix and followed up with urology 05/10/2020.  He had no idea what medications he was taking and was supposed to go back to instruct him on correct Lasix dose.  I saw the patient 05/16/2020 with increased shortness of breath with little activity and was eating high salt diet.  Never picked up his losartan medication.  It turns out he was written cannot read any of his medications.  I reached out to social workers and had pharmacy create pill packs for him.  I added losartan and checked an echo heart function still 20 to 25%.  Patient comes in for f/u. Says pharmacy isn't doing pill packs. Says he's taking lasix 20 mg 2 in am and 1 in afternoon. Still confused over his meds. Waiting on  forms to begin paramedicine help. Fatigue and shortness of breath have improved. Weight down 7 lbs.     Past Medical History:  Diagnosis Date   Anemia    Arthritis    Asthma    Cardiomyopathy ([place])    a. 2014: EF 40-45%, diffuse HK, and Grade 2 DD   Dyspnea    Emphysema    GERD (gastroesophageal reflux disease)    Hip pain    Hypertension    Peripheral vascular disease ([place])    Prostate disorder     Past Surgical History:  Procedure Laterality Date   ABDOMINAL SURGERY     CATARACT EXTRACTION W/PHACO Left 11/26/2016   Procedure: CATARACT EXTRACTION PHACO AND INTRAOCULAR LENS PLACEMENT LEFT EYE;  Surgeon: Tonny Branch, MD;  Location: [facility];  Service: Ophthalmology;  Laterality: Left;  CDE: 26.53    CATARACT EXTRACTION W/PHACO Right 01/28/2017   Procedure: CATARACT EXTRACTION PHACO AND INTRAOCULAR LENS PLACEMENT RIGHT EYE;  Surgeon: Tonny Branch, MD;  Location: [facility];  Service: Ophthalmology;  Laterality: Right;  CDE: 6.79   HERNIA REPAIR     LUNG SURGERY     MASS EXCISION Right 11/01/2017   Procedure: EXCISION CYST RIGHT KNEE;  Surgeon: Aviva Signs, MD;  Location: [facility];  Service: General;  Laterality: Right;   VASCULAR SURGERY      Current Medications: Current Meds  Medication Sig   albuterol (PROAIR HFA) 108 (90 Base) MCG/ACT inhaler Inhale  2 puffs into the lungs every 4 (four) hours as needed for wheezing or shortness of breath.   ANORO ELLIPTA 62.5-25 MCG/INH AEPB Inhale 1 puff into the lungs daily.   aspirin 81 MG chewable tablet Chew 81 mg by mouth daily.   cholecalciferol (VITAMIN D) 25 MCG (1000 UNIT) tablet Take 1,000 Units by mouth daily.   digoxin (LANOXIN) 0.125 MG tablet Take 125 mcg by mouth daily.   furosemide (LASIX) 20 MG tablet Take 20 mg by mouth daily.   losartan (COZAAR) 25 MG tablet Take 1 tablet (25 mg total) by mouth daily.   metoprolol succinate (TOPROL-XL) 25 MG 24 hr tablet Take 25 mg by mouth daily.   oxyCODONE (OXY IR/ROXICODONE) 5 MG  immediate release tablet Take 5 mg by mouth at bedtime.    pantoprazole (PROTONIX) 40 MG tablet Take 40 mg by mouth daily.   sildenafil (VIAGRA) 100 MG tablet Take 1 tablet (100 mg total) by mouth daily as needed.   spironolactone (ALDACTONE) 25 MG tablet Take 1 tablet by mouth daily.   TRELEGY ELLIPTA 100-62.5-25 MCG/INH AEPB Inhale 1 puff into the lungs daily.   [DISCONTINUED] furosemide (LASIX) 40 MG tablet Take 40 mg by mouth 2 (two) times daily.     Allergies:   Patient has no known allergies.   Social History   Socioeconomic History   Marital status: Legally Separated    Spouse name: Not on file   Number of children: Not on file   Years of education: Not on file   Highest education level: Not on file  Occupational History   Not on file  Tobacco Use   Smoking status: Every Day    Packs/day: 0.50    Years: 50.00    Pack years: 25.00    Types: Cigarettes   Smokeless tobacco: Never  Vaping Use   Vaping Use: Never used  Substance and Sexual Activity   Alcohol use: No   Drug use: No   Sexual activity: Yes    Birth control/protection: Condom  Other Topics Concern   Not on file  Social History Narrative   Not on file   Social Determinants of Health   Financial Resource Strain: Not on file  Food Insecurity: Not on file  Transportation Needs: Not on file  Physical Activity: Not on file  Stress: Not on file  Social Connections: Not on file     Family History:  The patient's  family history includes CVA in his brother; Cancer in his mother; Hypertension in his mother.   ROS:   Please see the history of present illness.    ROS All other systems reviewed and are negative.   PHYSICAL EXAM:   VS:  BP 108/66   Pulse 84   Ht 5\' 6"  (1.676 m)   Wt 117 lb (53.1 kg)   SpO2 96%   BMI 18.88 kg/m   Physical Exam  GEN: Thin, in no acute distress  Neck: no JVD, carotid bruits, or masses Cardiac:RRR; positive S4, 2/6 systolic murmur left sternal border Respiratory:  Decreased breath sounds at bases with fine crackles GI: soft, nontender, nondistended, + BS Ext: without cyanosis, clubbing, or edema, Good distal pulses bilaterally Neuro:  Alert and Oriented x 3  Psych: euthymic mood, full affect  Wt Readings from Last 3 Encounters:  06/27/20 117 lb (53.1 kg)  05/16/20 124 lb (56.2 kg)  04/26/20 114 lb 10.2 oz (52 kg)      Studies/Labs Reviewed:   EKG:  EKG  is not ordered today.    Recent Labs: 03/06/2020: ALT 18 04/12/2020: B Natriuretic Peptide 2,972.0; BUN 20; Creatinine, Ser 1.08; Hemoglobin 11.8; Platelets 284; Potassium 4.1; Sodium 140   Lipid Panel    Component Value Date/Time   CHOL 118 12/16/2017 0504   TRIG 31 12/16/2017 0504   HDL 71 12/16/2017 0504   CHOLHDL 1.7 12/16/2017 0504   VLDL 6 12/16/2017 0504   LDLCALC 41 12/16/2017 0504    Additional studies/ records that were reviewed today include:  2D echo 05/25/2020 IMPRESSIONS     1. Left ventricular ejection fraction, by estimation, is 20 to 25%. The  left ventricle has severely decreased function. The left ventricle  demonstrates global hypokinesis. The left ventricular internal cavity size  was mildly dilated. There is mild left  ventricular hypertrophy. Left ventricular diastolic parameters are  consistent with Grade II diastolic dysfunction (pseudonormalization).   2. Right ventricular systolic function is moderately reduced. The right  ventricular size is normal. There is moderately elevated pulmonary artery  systolic pressure. The estimated right ventricular systolic pressure is  00.9 mmHg.   3. Left atrial size was severely dilated.   4. There is a trivial pericardial effusion posterior to the left  ventricle.   5. The mitral valve is grossly normal. Mild mitral valve regurgitation.   6. The aortic valve is tricuspid. Aortic valve regurgitation is mild.  Aortic regurgitation PHT measures 425 msec.   7. The inferior vena cava is normal in size with greater than 50%   respiratory variability, suggesting right atrial pressure of 3 mmHg.    Risk Assessment/Calculations:         ASSESSMENT:    1. Secondary cardiomyopathy ([place])   2. Acute on chronic combined systolic and diastolic CHF (congestive heart failure) ([place])   3. History of COPD   4. Tobacco abuse   5. 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    chronic systolic and diastolic CHF patient currently taking Lasix 20 mg 2 tablets in a.m. and 1 in the afternoon the only way he knows how to take Lasix as somebody marked in blue ink.  He is illiterate and cannot read any of his medications.  I  reached out to social workers t and they were supposed to set up a para medicine program but we have not received referral form to fill out.  We will try again.  Pharmacy also not giving him pill packs which he clearly needs.  We will make another phone call.  2 g sodium diet discussed with him.  He cannot read it if we give it to him.    Continue current dose Lasix.  Trying to get labs that were done on Friday.    COPD status post partial lung resection   Tobacco abuse smokes 1 cigarette daily.   History of bradycardia and beta-blockers avoided in the past but currently on metoprolol 25 mg once daily.   Shared Decision Making/Informed Consent        Medication Adjustments/Labs and Tests Ordered: Current medicines are reviewed at length with the patient today.  Concerns regarding medicines are outlined above.  Medication changes, Labs and Tests ordered today are listed in the Patient Instructions below. Patient Instructions  Medication Instructions:  Your physician recommends that you continue on  your current medications as directed. Please refer to the Current Medication list given to you today.  *If you need a refill on your  cardiac medications before your next appointment, please call your pharmacy*   Lab Work: None today  If you have labs (blood work) drawn today and your tests are completely normal, you will receive your results only by: [place] (if you have MyChart) OR A paper copy in the mail If you have any lab test that is abnormal or we need to change your treatment, we will call you to review the results.   Testing/Procedures: None today    Follow-Up: At [facility], you and your health needs are our priority.  As part of our continuing mission to provide you with exceptional heart care, we have created designated Provider Care Teams.  These Care Teams include your primary Cardiologist (physician) and Advanced Practice Providers (APPs -  Physician Assistants and Nurse Practitioners) who all work together to provide you with the care you need, when you need it.  We recommend signing up for the patient portal called "MyChart".  Sign up information is provided on this After Visit Summary.  MyChart is used to connect with patients for Virtual Visits (Telemedicine).  Patients are able to view lab/test results, encounter notes, upcoming appointments, etc.  Non-urgent messages can be sent to your provider as well.   To learn more about what you can do with MyChart, go to [email or web address].    Your next appointment:   6 month(s)  The format for your next appointment:   In Person  Provider:   Rozann Lesches, MD   Other Instructions None    Signed, Ermalinda Barrios, PA-C  06/27/2020 12:17 PM    [address], [place], [place]  [address] Phone: [phone]; Fax: [phone]

## 2020-06-27 ENCOUNTER — Telehealth: Payer: Self-pay

## 2020-06-27 ENCOUNTER — Other Ambulatory Visit: Payer: Self-pay

## 2020-06-27 ENCOUNTER — Telehealth: Payer: Self-pay | Admitting: Licensed Clinical Social Worker

## 2020-06-27 ENCOUNTER — Ambulatory Visit (INDEPENDENT_AMBULATORY_CARE_PROVIDER_SITE_OTHER): Payer: Medicare Other | Admitting: Physician Assistant

## 2020-06-27 ENCOUNTER — Encounter: Payer: Self-pay | Admitting: Physician Assistant

## 2020-06-27 ENCOUNTER — Encounter (INDEPENDENT_AMBULATORY_CARE_PROVIDER_SITE_OTHER): Payer: Self-pay

## 2020-06-27 VITALS — BP 108/66 | HR 84 | Ht 66.0 in | Wt 117.0 lb

## 2020-06-27 DIAGNOSIS — Z8709 Personal history of other diseases of the respiratory system: Secondary | ICD-10-CM | POA: Diagnosis not present

## 2020-06-27 DIAGNOSIS — Z720 Tobacco use: Secondary | ICD-10-CM | POA: Diagnosis not present

## 2020-06-27 DIAGNOSIS — I5043 Acute on chronic combined systolic (congestive) and diastolic (congestive) heart failure: Secondary | ICD-10-CM | POA: Diagnosis not present

## 2020-06-27 DIAGNOSIS — I429 Cardiomyopathy, unspecified: Secondary | ICD-10-CM | POA: Diagnosis not present

## 2020-06-27 DIAGNOSIS — Z87898 Personal history of other specified conditions: Secondary | ICD-10-CM

## 2020-06-27 NOTE — Telephone Encounter (Signed)
I asked patient to come in office to sign release of information/consent for paramedicine. He states he would come tomorrow and sign form.  He then called back and stated he does not wish to have anyone help him, that he does fine by himself. I encouraged him to call back if he changed his mind.     I will FYI. M.lenze, PA-C, [place], LCSW

## 2020-06-27 NOTE — Patient Instructions (Signed)

## 2020-06-27 NOTE — Telephone Encounter (Signed)
LCSW received a referral from [facility] office, they had not received the previously sent referral form for paramedicine. I completed new referral form, pt will need to sign consent and provider to sign referral form. Pt clinicals also sent, confirmed received by Caryl Pina, CMA. Once completed can be faxed directly to [facility] program for potential paramedicine referral. Remain available for any additional questions/concerns.   Westley Hummer, MSW, [place]  [phone]

## 2020-09-20 ENCOUNTER — Ambulatory Visit: Payer: Medicare Other | Admitting: Urology

## 2020-09-20 DIAGNOSIS — N433 Hydrocele, unspecified: Secondary | ICD-10-CM

## 2020-09-20 DIAGNOSIS — N5201 Erectile dysfunction due to arterial insufficiency: Secondary | ICD-10-CM

## 2020-09-23 ENCOUNTER — Telehealth: Payer: Self-pay | Admitting: Urology

## 2020-09-23 NOTE — Telephone Encounter (Signed)
Patient walked into the office today requesting a refill of sildenafil to be sent to the [facility]   He was supposed to follow up this month, but his appt was rescheduled to 11/01/20.  Please advise.

## 2020-09-27 ENCOUNTER — Other Ambulatory Visit: Payer: Self-pay

## 2020-09-27 DIAGNOSIS — N5201 Erectile dysfunction due to arterial insufficiency: Secondary | ICD-10-CM

## 2020-09-27 MED ORDER — SILDENAFIL CITRATE 100 MG PO TABS: 100.0000 mg | ORAL_TABLET | Freq: Every day | ORAL | 0 refills | Status: AC | PRN

## 2020-09-27 NOTE — Telephone Encounter (Signed)
Rx sent 

## 2020-09-27 NOTE — Telephone Encounter (Signed)
See below Ok to refill?

## 2020-10-15 ENCOUNTER — Emergency Department (HOSPITAL_COMMUNITY)
Admission: EM | Admit: 2020-10-15 | Discharge: 2020-10-15 | Discharge status: Against Medical Advice | Payer: Medicare Other | Attending: Emergency Medicine | Admitting: Emergency Medicine

## 2020-10-15 ENCOUNTER — Emergency Department (HOSPITAL_COMMUNITY): Payer: Medicare Other

## 2020-10-15 ENCOUNTER — Encounter (HOSPITAL_COMMUNITY): Payer: Self-pay | Admitting: Emergency Medicine

## 2020-10-15 ENCOUNTER — Other Ambulatory Visit: Payer: Self-pay

## 2020-10-15 DIAGNOSIS — Z5321 Procedure and treatment not carried out due to patient leaving prior to being seen by health care provider: Secondary | ICD-10-CM | POA: Diagnosis not present

## 2020-10-15 DIAGNOSIS — R0602 Shortness of breath: Secondary | ICD-10-CM | POA: Insufficient documentation

## 2020-10-15 MED ORDER — ALBUTEROL SULFATE HFA 108 (90 BASE) MCG/ACT IN AERS: 2.0000 | INHALATION_SPRAY | RESPIRATORY_TRACT | Status: DC | PRN

## 2020-10-15 NOTE — ED Notes (Signed)
Pt exits treatment room upset due to MD not seeing pt and pt not being allowed to have water yet. This RN attempted to explain to pt that MD would be in to see him just as soon as he could. Pt walked down hallway towards waiting room.

## 2020-10-15 NOTE — ED Triage Notes (Signed)
Pt c/o shortness of breath for the past 2 days. Pt states he has used his inhalers with no relief. Hx of COPD.

## 2020-10-17 ENCOUNTER — Other Ambulatory Visit: Payer: Self-pay

## 2020-10-17 ENCOUNTER — Emergency Department (HOSPITAL_COMMUNITY)
Admission: EM | Admit: 2020-10-17 | Discharge: 2020-10-17 | Discharge status: Against Medical Advice | Payer: Medicare Other

## 2020-10-28 ENCOUNTER — Emergency Department (HOSPITAL_COMMUNITY): Payer: Medicare Other

## 2020-10-28 ENCOUNTER — Encounter (HOSPITAL_COMMUNITY): Payer: Self-pay | Admitting: *Deleted

## 2020-10-28 ENCOUNTER — Observation Stay (HOSPITAL_COMMUNITY)
Admission: EM | Admit: 2020-10-28 | Discharge: 2020-10-29 | DRG: 291 | Discharge status: Against Medical Advice | Payer: Medicare Other | Attending: Internal Medicine | Admitting: Internal Medicine

## 2020-10-28 DIAGNOSIS — Z7951 Long term (current) use of inhaled steroids: Secondary | ICD-10-CM | POA: Diagnosis not present

## 2020-10-28 DIAGNOSIS — Z7982 Long term (current) use of aspirin: Secondary | ICD-10-CM | POA: Diagnosis not present

## 2020-10-28 DIAGNOSIS — I739 Peripheral vascular disease, unspecified: Secondary | ICD-10-CM | POA: Diagnosis not present

## 2020-10-28 DIAGNOSIS — N400 Enlarged prostate without lower urinary tract symptoms: Secondary | ICD-10-CM | POA: Diagnosis not present

## 2020-10-28 DIAGNOSIS — I110 Hypertensive heart disease with heart failure: Principal | ICD-10-CM | POA: Diagnosis present

## 2020-10-28 DIAGNOSIS — F17200 Nicotine dependence, unspecified, uncomplicated: Secondary | ICD-10-CM | POA: Diagnosis not present

## 2020-10-28 DIAGNOSIS — I5023 Acute on chronic systolic (congestive) heart failure: Secondary | ICD-10-CM | POA: Diagnosis present

## 2020-10-28 DIAGNOSIS — F17210 Nicotine dependence, cigarettes, uncomplicated: Secondary | ICD-10-CM | POA: Diagnosis present

## 2020-10-28 DIAGNOSIS — I429 Cardiomyopathy, unspecified: Secondary | ICD-10-CM | POA: Diagnosis present

## 2020-10-28 DIAGNOSIS — E8770 Fluid overload, unspecified: Secondary | ICD-10-CM | POA: Diagnosis present

## 2020-10-28 DIAGNOSIS — E876 Hypokalemia: Secondary | ICD-10-CM | POA: Diagnosis not present

## 2020-10-28 DIAGNOSIS — I5043 Acute on chronic combined systolic (congestive) and diastolic (congestive) heart failure: Secondary | ICD-10-CM | POA: Diagnosis present

## 2020-10-28 DIAGNOSIS — Z8249 Family history of ischemic heart disease and other diseases of the circulatory system: Secondary | ICD-10-CM | POA: Diagnosis not present

## 2020-10-28 DIAGNOSIS — J439 Emphysema, unspecified: Secondary | ICD-10-CM | POA: Diagnosis not present

## 2020-10-28 DIAGNOSIS — I509 Heart failure, unspecified: Secondary | ICD-10-CM

## 2020-10-28 DIAGNOSIS — Z20822 Contact with and (suspected) exposure to covid-19: Secondary | ICD-10-CM | POA: Diagnosis not present

## 2020-10-28 DIAGNOSIS — K219 Gastro-esophageal reflux disease without esophagitis: Secondary | ICD-10-CM | POA: Diagnosis not present

## 2020-10-28 DIAGNOSIS — Z79899 Other long term (current) drug therapy: Secondary | ICD-10-CM

## 2020-10-28 LAB — CBC WITH DIFFERENTIAL/PLATELET
Abs Immature Granulocytes: 0.01 10*3/uL (ref 0.00–0.07)
Basophils Absolute: 0 10*3/uL (ref 0.0–0.1)
Basophils Relative: 0 %
Eosinophils Absolute: 0.1 10*3/uL (ref 0.0–0.5)
Eosinophils Relative: 2 %
HCT: 37 % — ABNORMAL LOW (ref 39.0–52.0)
Hemoglobin: 11.5 g/dL — ABNORMAL LOW (ref 13.0–17.0)
Immature Granulocytes: 0 %
Lymphocytes Relative: 21 %
Lymphs Abs: 1 10*3/uL (ref 0.7–4.0)
MCH: 28.5 pg (ref 26.0–34.0)
MCHC: 31.1 g/dL (ref 30.0–36.0)
MCV: 91.6 fL (ref 80.0–100.0)
Monocytes Absolute: 0.6 10*3/uL (ref 0.1–1.0)
Monocytes Relative: 13 %
Neutro Abs: 3 10*3/uL (ref 1.7–7.7)
Neutrophils Relative %: 64 %
Platelets: 283 10*3/uL (ref 150–400)
RBC: 4.04 MIL/uL — ABNORMAL LOW (ref 4.22–5.81)
RDW: 14.6 % (ref 11.5–15.5)
WBC: 4.7 10*3/uL (ref 4.0–10.5)
nRBC: 0 % (ref 0.0–0.2)

## 2020-10-28 LAB — BRAIN NATRIURETIC PEPTIDE: B Natriuretic Peptide: 3017 pg/mL — ABNORMAL HIGH (ref 0.0–100.0)

## 2020-10-28 LAB — COMPREHENSIVE METABOLIC PANEL
ALT: 23 U/L (ref 0–44)
AST: 35 U/L (ref 15–41)
Albumin: 3.5 g/dL (ref 3.5–5.0)
Alkaline Phosphatase: 61 U/L (ref 38–126)
Anion gap: 8 (ref 5–15)
BUN: 20 mg/dL (ref 8–23)
CO2: 36 mmol/L — ABNORMAL HIGH (ref 22–32)
Calcium: 8.6 mg/dL — ABNORMAL LOW (ref 8.9–10.3)
Chloride: 96 mmol/L — ABNORMAL LOW (ref 98–111)
Creatinine, Ser: 1.3 mg/dL — ABNORMAL HIGH (ref 0.61–1.24)
GFR, Estimated: 59 mL/min — ABNORMAL LOW (ref >60)
Glucose, Bld: 85 mg/dL (ref 70–99)
Potassium: 3.4 mmol/L — ABNORMAL LOW (ref 3.5–5.1)
Sodium: 140 mmol/L (ref 135–145)
Total Bilirubin: 0.3 mg/dL (ref 0.3–1.2)
Total Protein: 6.8 g/dL (ref 6.5–8.1)

## 2020-10-28 LAB — RESP PANEL BY RT-PCR (FLU A&B, COVID) ARPGX2
Influenza A by PCR: NEGATIVE
Influenza B by PCR: NEGATIVE
SARS Coronavirus 2 by RT PCR: NEGATIVE

## 2020-10-28 LAB — TROPONIN I (HIGH SENSITIVITY)
Troponin I (High Sensitivity): 18 ng/L — ABNORMAL HIGH (ref <18)
Troponin I (High Sensitivity): 16 ng/L (ref <18)

## 2020-10-28 MED ORDER — ALBUTEROL SULFATE HFA 108 (90 BASE) MCG/ACT IN AERS: 2.0000 | INHALATION_SPRAY | RESPIRATORY_TRACT | Status: DC | PRN

## 2020-10-28 MED ORDER — OXYCODONE HCL 5 MG PO TABS
5.0000 mg | ORAL_TABLET | Freq: Every day | ORAL | Status: DC
  Administered 2020-10-28: 5 mg via ORAL
  Filled 2020-10-28: qty 1

## 2020-10-28 MED ORDER — METOPROLOL SUCCINATE ER 25 MG PO TB24: 25.0000 mg | ORAL_TABLET | Freq: Every day | ORAL | Status: DC

## 2020-10-28 MED ORDER — ASPIRIN 81 MG PO CHEW: 81.0000 mg | CHEWABLE_TABLET | Freq: Every day | ORAL | Status: DC

## 2020-10-28 MED ORDER — FUROSEMIDE 10 MG/ML IJ SOLN
40.0000 mg | Freq: Two times a day (BID) | INTRAMUSCULAR | Status: DC
  Filled 2020-10-28: qty 4

## 2020-10-28 MED ORDER — DIGOXIN 125 MCG PO TABS: 125.0000 ug | ORAL_TABLET | Freq: Every day | ORAL | Status: DC

## 2020-10-28 MED ORDER — UMECLIDINIUM-VILANTEROL 62.5-25 MCG/ACT IN AEPB: 1.0000 | INHALATION_SPRAY | Freq: Every day | RESPIRATORY_TRACT | Status: DC

## 2020-10-28 MED ORDER — SODIUM CHLORIDE 0.9 % IV SOLN: 250.0000 mL | INTRAVENOUS | Status: DC | PRN

## 2020-10-28 MED ORDER — LOSARTAN POTASSIUM 25 MG PO TABS: 25.0000 mg | ORAL_TABLET | Freq: Every day | ORAL | Status: DC

## 2020-10-28 MED ORDER — SPIRONOLACTONE 25 MG PO TABS: 25.0000 mg | ORAL_TABLET | Freq: Every day | ORAL | Status: DC

## 2020-10-28 MED ORDER — POTASSIUM CHLORIDE CRYS ER 20 MEQ PO TBCR
40.0000 meq | EXTENDED_RELEASE_TABLET | Freq: Once | ORAL | Status: AC
  Administered 2020-10-28: 40 meq via ORAL
  Filled 2020-10-28: qty 2

## 2020-10-28 MED ORDER — ACETAMINOPHEN 325 MG PO TABS: 650.0000 mg | ORAL_TABLET | ORAL | Status: DC | PRN

## 2020-10-28 MED ORDER — SODIUM CHLORIDE 0.9% FLUSH
3.0000 mL | Freq: Two times a day (BID) | INTRAVENOUS | Status: DC
  Administered 2020-10-28: 3 mL via INTRAVENOUS

## 2020-10-28 MED ORDER — PANTOPRAZOLE SODIUM 40 MG PO TBEC: 40.0000 mg | DELAYED_RELEASE_TABLET | Freq: Every day | ORAL | Status: DC

## 2020-10-28 MED ORDER — ONDANSETRON HCL 4 MG/2ML IJ SOLN: 4.0000 mg | Freq: Four times a day (QID) | INTRAMUSCULAR | Status: DC | PRN

## 2020-10-28 MED ORDER — HEPARIN SODIUM (PORCINE) 5000 UNIT/ML IJ SOLN
5000.0000 [IU] | Freq: Three times a day (TID) | INTRAMUSCULAR | Status: DC
  Filled 2020-10-28: qty 1

## 2020-10-28 MED ORDER — FUROSEMIDE 10 MG/ML IJ SOLN
60.0000 mg | Freq: Once | INTRAMUSCULAR | Status: AC
  Administered 2020-10-28: 60 mg via INTRAVENOUS
  Filled 2020-10-28: qty 6

## 2020-10-28 MED ORDER — SODIUM CHLORIDE 0.9% FLUSH: 3.0000 mL | INTRAVENOUS | Status: DC | PRN

## 2020-10-28 NOTE — ED Notes (Signed)
Patient states he is going to call the police because he needs to get in the back.

## 2020-10-28 NOTE — ED Triage Notes (Signed)
C/o shortness of breath, hostile in triage, states his medication is not working

## 2020-10-28 NOTE — ED Provider Notes (Signed)
[facility] EMERGENCY DEPARTMENT Provider Note   CSN: [id number] Arrival date & time: 10/28/20  1318     History Chief Complaint  Patient presents with   Leg Swelling    James Cervantes is a 70 y.o. male with a history including anemia, asthma, cardiomyopathy with last echo May 2022 showing ejection fraction of 20 to 25%, hypertension, peripheral vascular disease presenting for evaluation of increasing swelling, describing swelling in his feet ankles, all the way extending into his thighs and including his scrotum, also with increasing shortness of breath stating he cannot walk across the room without getting winded and has difficulty lying flat secondary to shortness of breath.  He denies chest pain, palpitations.  He has had no fevers or chills, denies nausea or vomiting, diaphoresis.  He is currently taking HCTZ 40 mg daily without improvement in his symptoms.  He does state that he had been taking other fluid pills but was switched to HCTZ 20 mg daily by his PCP recently.  He states he told them that he is supposed to take 40 mg so he has been doubling this dose.  Review of chart indicates that he should be on spironolactone and Lasix, it is unclear when these medications were changed but presents with HCTZ only in his medicine bag.   Review of chart, was prescribed spironolactone and The history is provided by the patient.      Past Medical History:  Diagnosis Date   Anemia    Arthritis    Asthma    Cardiomyopathy ([place])    a. 2014: EF 40-45%, diffuse HK, and Grade 2 DD   Dyspnea    Emphysema    GERD (gastroesophageal reflux disease)    Hip pain    Hypertension    Peripheral vascular disease ([place])    Prostate disorder     Patient Active Problem List   Diagnosis Date Noted   Acute on chronic systolic CHF (congestive heart failure) ([place]) 10/28/2020   Noncompliance 12/16/2017   Elevated troponin 12/16/2017   Bronchitis 12/16/2017   COPD with acute exacerbation ([place])  12/15/2017   Lipoma of extremity    Secondary cardiomyopathy ([place]) 10/14/2012   Atypical chest pain 10/13/2012   Chest pain 10/12/2012   Abnormal EKG 10/12/2012   GERD (gastroesophageal reflux disease) 10/12/2012   UNSPECIFIED ANEMIA 05/18/2008   ERECTILE DYSFUNCTION 09/25/2007   UNS PULMONARY TB-CULT DX 06/19/2007   UNDERWEIGHT 06/19/2007   OSTEOARTHRITIS, HIP, RIGHT 02/18/2006   HEPATITIS C 01/08/2006   DISORDER, TOBACCO USE 01/08/2006   MIGRAINE HEADACHE 01/08/2006   ALLERGIC RHINITIS 01/08/2006   GERD 01/08/2006   BPH (benign prostatic hyperplasia) 01/08/2006   OSTEOARTHRITIS 01/08/2006    Past Surgical History:  Procedure Laterality Date   ABDOMINAL SURGERY     CATARACT EXTRACTION W/PHACO Left 11/26/2016   Procedure: CATARACT EXTRACTION PHACO AND INTRAOCULAR LENS PLACEMENT LEFT EYE;  Surgeon: Tonny Branch, MD;  Location: [facility];  Service: Ophthalmology;  Laterality: Left;  CDE: 26.53    CATARACT EXTRACTION W/PHACO Right 01/28/2017   Procedure: CATARACT EXTRACTION PHACO AND INTRAOCULAR LENS PLACEMENT RIGHT EYE;  Surgeon: Tonny Branch, MD;  Location: [facility];  Service: Ophthalmology;  Laterality: Right;  CDE: 6.79   HERNIA REPAIR     LUNG SURGERY     MASS EXCISION Right 11/01/2017   Procedure: EXCISION CYST RIGHT KNEE;  Surgeon: Aviva Signs, MD;  Location: [facility];  Service: General;  Laterality: Right;   VASCULAR SURGERY  Family History  Problem Relation Age of Onset   Cancer Mother    Hypertension Mother    CVA Brother     Social History   Tobacco Use   Smoking status: Every Day    Packs/day: 0.50    Years: 50.00    Pack years: 25.00    Types: Cigarettes   Smokeless tobacco: Never  Vaping Use   Vaping Use: Never used  Substance Use Topics   Alcohol use: No   Drug use: No    Home Medications Prior to Admission medications   Medication Sig Start Date End Date Taking? Authorizing Provider  albuterol (PROAIR HFA) 108 (90 Base) MCG/ACT inhaler Inhale  2 puffs into the lungs every 4 (four) hours as needed for wheezing or shortness of breath. 12/16/17   Johnson, Clanford L, MD  ANORO ELLIPTA 62.5-25 MCG/INH AEPB Inhale 1 puff into the lungs daily. 02/17/20   [provider]  aspirin 81 MG chewable tablet Chew 81 mg by mouth daily.    [provider]  cholecalciferol (VITAMIN D) 25 MCG (1000 UNIT) tablet Take 1,000 Units by mouth daily.    [provider]  digoxin (LANOXIN) 0.125 MG tablet Take 125 mcg by mouth daily. 02/05/20   [provider]  furosemide (LASIX) 20 MG tablet Take 20 mg by mouth daily.    [provider]  losartan (COZAAR) 25 MG tablet Take 1 tablet (25 mg total) by mouth daily. 05/16/20 08/14/20  Imogene Burn, PA-C  metoprolol succinate (TOPROL-XL) 25 MG 24 hr tablet Take 25 mg by mouth daily.    [provider]  oxyCODONE (OXY IR/ROXICODONE) 5 MG immediate release tablet Take 5 mg by mouth at bedtime.     [provider]  pantoprazole (PROTONIX) 40 MG tablet Take 40 mg by mouth daily. 02/22/20   [provider]  sildenafil (VIAGRA) 100 MG tablet Take 1 tablet (100 mg total) by mouth daily as needed. 09/27/20   Franchot Gallo, MD  spironolactone (ALDACTONE) 25 MG tablet Take 1 tablet by mouth daily. 04/28/20   [provider]  TRELEGY ELLIPTA 100-62.5-25 MCG/INH AEPB Inhale 1 puff into the lungs daily. 08/17/19   [provider]    Allergies    Patient has no known allergies.  Review of Systems   Review of Systems  Constitutional:  Negative for chills, diaphoresis and fever.  HENT:  Negative for congestion and sore throat.   Eyes: Negative.   Respiratory:  Positive for shortness of breath. Negative for chest tightness and wheezing.   Cardiovascular:  Positive for leg swelling. Negative for chest pain.  Gastrointestinal:  Negative for abdominal pain, nausea and vomiting.  Genitourinary: Negative.   Musculoskeletal:  Negative for  arthralgias, joint swelling and neck pain.  Skin: Negative.  Negative for rash and wound.  Neurological:  Negative for dizziness, weakness, light-headedness, numbness and headaches.  Psychiatric/Behavioral: Negative.    All other systems reviewed and are negative.  Physical Exam Updated Vital Signs BP 134/84 (BP Location: Right Arm)   Pulse 84   Temp 97.8 F (36.6 C) (Oral)   Resp 18   SpO2 94%   Physical Exam Vitals and nursing note reviewed. Exam conducted with a chaperone present.  Constitutional:      Appearance: He is well-developed.  HENT:     Head: Normocephalic and atraumatic.  Eyes:     Conjunctiva/sclera: Conjunctivae normal.  Cardiovascular:     Rate and Rhythm: Normal rate and regular rhythm.  Heart sounds: Normal heart sounds.  Pulmonary:     Effort: Respiratory distress present.     Breath sounds: Decreased air movement present. No wheezing.     Comments: Becomes winded when speaking.  Abdominal:     General: Bowel sounds are normal.     Palpations: Abdomen is soft.     Tenderness: There is no abdominal tenderness.  Genitourinary:    Comments: Scrotal edema present. Musculoskeletal:        General: Normal range of motion.     Cervical back: Normal range of motion.     Right lower leg: Edema present.     Left lower leg: Edema present.     Comments: Pitting edema to mid thighs.  Skin:    General: Skin is warm and dry.  Neurological:     Mental Status: He is alert.    ED Results / Procedures / Treatments   Labs (all labs ordered are listed, but only abnormal results are displayed) Labs Reviewed  CBC WITH DIFFERENTIAL/PLATELET - Abnormal; Notable for the following components:      Result Value   RBC 4.04 (*)    Hemoglobin 11.5 (*)    HCT 37.0 (*)    All other components within normal limits  COMPREHENSIVE METABOLIC PANEL - Abnormal; Notable for the following components:   Potassium 3.4 (*)    Chloride 96 (*)    CO2 36 (*)    Creatinine, Ser  1.30 (*)    Calcium 8.6 (*)    GFR, Estimated 59 (*)    All other components within normal limits  BRAIN NATRIURETIC PEPTIDE - Abnormal; Notable for the following components:   B Natriuretic Peptide 3,017.0 (*)    All other components within normal limits  TROPONIN I (HIGH SENSITIVITY) - Abnormal; Notable for the following components:   Troponin I (High Sensitivity) 18 (*)    All other components within normal limits  TROPONIN I (HIGH SENSITIVITY)    EKG EKG Interpretation  Date/Time:  Friday October 28 2020 17:22:44 EDT Ventricular Rate:  84 PR Interval:  159 QRS Duration: 104 QT Interval:  415 QTC Calculation: 491 R Axis:   75 Text Interpretation: Sinus rhythm Ventricular premature complex LVH with secondary repolarization abnormality Anterior infarct, old Baseline wander since last tracing no significant change Confirmed by Daleen Bo [phone]) on 10/28/2020 5:36:19 PM  Radiology DG Chest Port 1 View  Result Date: 10/28/2020 CLINICAL DATA:  Shortness of breath. EXAM: PORTABLE CHEST 1 VIEW COMPARISON:  October 15, 2020. FINDINGS: Stable cardiomegaly. Postsurgical changes are seen again in the left hemithorax with associated volume loss and mediastinal shift. Stable left basilar opacity is noted concerning for scarring and possible atelectasis. Minimal right pleural effusion is noted. Minimal right basilar scarring or atelectasis is noted. Moderate levoscoliosis is seen involving the upper thoracic spine. IMPRESSION: Stable postsurgical changes are seen involving the left hemithorax with associated volume loss and mediastinal shift. Minimal right pleural effusion is noted with minimal right basilar scarring or atelectasis. Electronically Signed   By: Marijo Conception M.D.   On: 10/28/2020 15:06    Procedures Procedures   Medications Ordered in ED Medications  furosemide (LASIX) injection 40 mg (has no administration in time range)  potassium chloride SA (KLOR-CON) CR tablet 40  mEq (has no administration in time range)  furosemide (LASIX) injection 60 mg (60 mg Intravenous Given 10/28/20 1625)    ED Course  I have reviewed the triage vital signs and the nursing notes.  Pertinent labs & imaging results that were available during my care of the patient were reviewed by me and considered in my medical decision making (see chart for details).    MDM Rules/Calculators/A&P                           Patient with acute on chronic CHF.  He was given IV Lasix 60 mg.  He would benefit from at least an overnight observation and continued diuresis.  He presents only with HCTZ, prior chart indicates spironolactone and lasix, pt is not on either of these meds.    Sig sob and peripheral edema.   Discussed with Dr. [facility] who accepts pt for admission.  Final Clinical Impression(s) / ED Diagnoses Final diagnoses:  Acute on chronic congestive heart failure, unspecified heart failure type [facility])    Rx / DC Orders ED Discharge Orders     None        Landis Martins 10/28/20 1740    Varney Biles, MD 10/29/20 1122

## 2020-10-28 NOTE — H&P (Signed)
TRH H&P   Patient Demographics:    James Cervantes, is a 70 y.o. male  MRN: [id number]   DOB - Nov 02, 1950  Admit Date - 10/28/2020  Outpatient Primary MD for the patient is Pcp, No  Referring MD/NP/PA: PA Idol  Outpatient Specialists: cardiology Dr Domenic Polite    Patient coming from: Home  Chief Complaint  Patient presents with   Leg Swelling      HPI:    James Cervantes  is a 70 y.o. male,  with history of cardiomyopathy ejection fraction 20 to 25%, hypertension, COPD, partial lung resection, tobacco abuse.  -Patient presents to ED secondary to complaints of shortness of breath and leg swelling, patient report he is compliant with his medication, but has poor understanding of his regimen, and unclear what medication he was taking and which when he was not, patient report worsening shortness of breath, mainly exertional, he does report some mild orthopnea as well, smoking 1 pack lasting 4 days, he denies any chest pain, palpitation, fever or chills, no nausea, no vomiting, no diaphoresis, patient report he was on hydrochlorothiazide, it is unclear who it was given with, and as well he is having bottles for metoprolol, losartan, spironolactone and pantoprazole, he did not have any bottles for Lasix, so unclear if he was taking Lasix or not. -In ED there was no hypoxia, but he is with significant lower extremity edema, elevated JVD, chest x-ray with volume overload and mild pleural effusion, labs significant for creatinine 1.3, potassium 3.4, significantly elevated BNP at 3000 level, So [facility] hospitalist consulted to admit for CHF.           Review of systems:    In addition to the HPI above,  No Fever-chills, No Headache, No changes with Vision or hearing, No problems swallowing food or Liquids, No Chest pain, Cough patient complains of shortness of breath No Abdominal pain, No Nausea or  Vommitting, Bowel movements are regular, No Blood in stool or Urine, No dysuria, No new skin rashes or bruises, No new joints pains-aches,  No new weakness, tingling, numbness in any extremity, No recent weight gain or loss, No polyuria, polydypsia or polyphagia, No significant Mental Stressors.  A full 10 point Review of Systems was done, except as stated above, all other Review of Systems were negative.   With Past History of the following :    Past Medical History:  Diagnosis Date   Anemia    Arthritis    Asthma    Cardiomyopathy ([place])    a. 2014: EF 40-45%, diffuse HK, and Grade 2 DD   Dyspnea    Emphysema    GERD (gastroesophageal reflux disease)    Hip pain    Hypertension    Peripheral vascular disease ([place])    Prostate disorder       Past Surgical History:  Procedure Laterality Date   ABDOMINAL SURGERY  CATARACT EXTRACTION W/PHACO Left 11/26/2016   Procedure: CATARACT EXTRACTION PHACO AND INTRAOCULAR LENS PLACEMENT LEFT EYE;  Surgeon: Tonny Branch, MD;  Location: [facility];  Service: Ophthalmology;  Laterality: Left;  CDE: 26.53    CATARACT EXTRACTION W/PHACO Right 01/28/2017   Procedure: CATARACT EXTRACTION PHACO AND INTRAOCULAR LENS PLACEMENT RIGHT EYE;  Surgeon: Tonny Branch, MD;  Location: [facility];  Service: Ophthalmology;  Laterality: Right;  CDE: 6.79   HERNIA REPAIR     LUNG SURGERY     MASS EXCISION Right 11/01/2017   Procedure: EXCISION CYST RIGHT KNEE;  Surgeon: Aviva Signs, MD;  Location: [facility];  Service: General;  Laterality: Right;   VASCULAR SURGERY        Social History:     Social History   Tobacco Use   Smoking status: Every Day    Packs/day: 0.50    Years: 50.00    Pack years: 25.00    Types: Cigarettes   Smokeless tobacco: Never  Substance Use Topics   Alcohol use: No     Family History :     Family History  Problem Relation Age of Onset   Cancer Mother    Hypertension Mother    CVA Brother      Home Medications:    Prior to Admission medications   Medication Sig Start Date End Date Taking? Authorizing Provider  albuterol (PROAIR HFA) 108 (90 Base) MCG/ACT inhaler Inhale 2 puffs into the lungs every 4 (four) hours as needed for wheezing or shortness of breath. 12/16/17   Johnson, Clanford L, MD  ANORO ELLIPTA 62.5-25 MCG/INH AEPB Inhale 1 puff into the lungs daily. 02/17/20   [provider]  aspirin 81 MG chewable tablet Chew 81 mg by mouth daily.    [provider]  cholecalciferol (VITAMIN D) 25 MCG (1000 UNIT) tablet Take 1,000 Units by mouth daily.    [provider]  digoxin (LANOXIN) 0.125 MG tablet Take 125 mcg by mouth daily. 02/05/20   [provider]  furosemide (LASIX) 20 MG tablet Take 20 mg by mouth daily.    [provider]  losartan (COZAAR) 25 MG tablet Take 1 tablet (25 mg total) by mouth daily. 05/16/20 08/14/20  Imogene Burn, PA-C  metoprolol succinate (TOPROL-XL) 25 MG 24 hr tablet Take 25 mg by mouth daily.    [provider]  oxyCODONE (OXY IR/ROXICODONE) 5 MG immediate release tablet Take 5 mg by mouth at bedtime.     [provider]  pantoprazole (PROTONIX) 40 MG tablet Take 40 mg by mouth daily. 02/22/20   [provider]  sildenafil (VIAGRA) 100 MG tablet Take 1 tablet (100 mg total) by mouth daily as needed. 09/27/20   Franchot Gallo, MD  spironolactone (ALDACTONE) 25 MG tablet Take 1 tablet by mouth daily. 04/28/20   [provider]  TRELEGY ELLIPTA 100-62.5-25 MCG/INH AEPB Inhale 1 puff into the lungs daily. 08/17/19   [provider]     Allergies:    No Known Allergies   Physical Exam:   Vitals  Blood pressure 134/84, pulse 84, temperature 97.8 F (36.6 C), temperature source Oral, resp. rate 18, SpO2 94 %.   1. General thin appearing, frail male, laying in bed apparent distress.  2. Normal affect and insight, Not Suicidal or Homicidal, Awake Alert, Oriented X 3.  3. No  F.N deficits, ALL C.Nerves Intact, Strength 5/5 all 4 extremities, Sensation intact all 4 extremities, Plantars down going.  4. Ears and Eyes  appear Normal, Conjunctivae clear, PERRLA. Moist Oral Mucosa.  5. Supple Neck, , No cervical lymphadenopathy appriciated, No Carotid Bruits.  Patient with positive JVD all the way to the jaw  6. Symmetrical Chest wall movement, air entry at the bases, minimal crackles, no wheezing, +2 edema  7. RRR, No Gallops, Rubs or Murmurs, No Parasternal Heave.  8. Positive Bowel Sounds, Abdomen Soft, No tenderness, No organomegaly appriciated,No rebound -guarding or rigidity.  9.  No Cyanosis, Normal Skin Turgor, No Skin Rash or Bruise.  10. Good muscle tone,  joints appear normal , no effusions, Normal ROM.  11. No Palpable Lymph Nodes in Neck or Axillae    Data Review:    CBC Recent Labs  Lab 10/28/20 1451  WBC 4.7  HGB 11.5*  HCT 37.0*  PLT 283  MCV 91.6  MCH 28.5  MCHC 31.1  RDW 14.6  LYMPHSABS 1.0  MONOABS 0.6  EOSABS 0.1  BASOSABS 0.0   ------------------------------------------------------------------------------------------------------------------  Chemistries  Recent Labs  Lab 10/28/20 1451  NA 140  K 3.4*  CL 96*  CO2 36*  GLUCOSE 85  BUN 20  CREATININE 1.30*  CALCIUM 8.6*  AST 35  ALT 23  ALKPHOS 61  BILITOT 0.3   ------------------------------------------------------------------------------------------------------------------ estimated creatinine clearance is 48.2 mL/min (A) (by C-G formula based on SCr of 1.3 mg/dL (H)). ------------------------------------------------------------------------------------------------------------------ No results for input(s): TSH, T4TOTAL, T3FREE, THYROIDAB in the last 72 hours.  Invalid input(s): FREET3  Coagulation profile No results for input(s): INR, PROTIME in the last 168  hours. ------------------------------------------------------------------------------------------------------------------- No results for input(s): DDIMER in the last 72 hours. -------------------------------------------------------------------------------------------------------------------  Cardiac Enzymes No results for input(s): CKMB, TROPONINI, MYOGLOBIN in the last 168 hours.  Invalid input(s): CK ------------------------------------------------------------------------------------------------------------------    Component Value Date/Time   BNP 3,017.0 (H) 10/28/2020 1451     ---------------------------------------------------------------------------------------------------------------  Urinalysis    Component Value Date/Time   COLORURINE YELLOW 05/10/2014 1436   APPEARANCEUR Clear 05/10/2020 1052   LABSPEC 1.025 05/10/2014 1436   PHURINE 6.5 05/10/2014 1436   GLUCOSEU Negative 05/10/2020 1052   HGBUR TRACE (A) 05/10/2014 1436   HGBUR negative 10/23/2007 1035   BILIRUBINUR Negative 05/10/2020 [place] 05/10/2014 1436   PROTEINUR 2+ (A) 05/10/2020 1052   PROTEINUR NEGATIVE 05/10/2014 1436   UROBILINOGEN 0.2 05/10/2014 1436   NITRITE Negative 05/10/2020 1052   NITRITE NEGATIVE 05/10/2014 1436   LEUKOCYTESUR Negative 05/10/2020 1052    ----------------------------------------------------------------------------------------------------------------   Imaging Results:    DG Chest Port 1 View  Result Date: 10/28/2020 CLINICAL DATA:  Shortness of breath. EXAM: PORTABLE CHEST 1 VIEW COMPARISON:  October 15, 2020. FINDINGS: Stable cardiomegaly. Postsurgical changes are seen again in the left hemithorax with associated volume loss and mediastinal shift. Stable left basilar opacity is noted concerning for scarring and possible atelectasis. Minimal right pleural effusion is noted. Minimal right basilar scarring or atelectasis is noted. Moderate levoscoliosis is  seen involving the upper thoracic spine. IMPRESSION: Stable postsurgical changes are seen involving the left hemithorax with associated volume loss and mediastinal shift. Minimal right pleural effusion is noted with minimal right basilar scarring or atelectasis. Electronically Signed   By: Marijo Conception M.D.   On: 10/28/2020 15:06    My personal review of EKG: Pending     Assessment & Plan:    Active Problems:   DISORDER, TOBACCO USE   GERD   BPH (benign prostatic hyperplasia)   Acute on chronic systolic CHF (congestive heart failure) (HCC)   Acute on  chronic systolic/diastolic CHF-  -With known EF 20 to 25% on most recent echo this year -Does appear to be volume overloaded, on physical exam with lower extremity edema, positive JVD, elevated BNP, and volume overload on imaging -Admit under CHF pathway, he will be on Lasix 40 mg IV twice daily, daily weight, strict ins and outs, daily BMP, monitor electrolyte and replete as needed -Continue with home medication include digoxin, Toprol, losartan and spironolactone,  Tobacco abuse -Patient reports 1 pack lasting 4 days, he was counseled  COPD -He is with no active wheezing currently, continue with home medications  hypokalemia -Repleting, especially he will be receiving IV Lasix hypokalemia  GERD -Continue with PPI  Hypertension -Continue with losartan and metoprolol  DVT Prophylaxis Heparin  SCDs   AM Labs Ordered, also please review Full Orders  Family Communication: Admission, patients condition and plan of care including tests being ordered have been discussed with the patient  who indicate understanding and agree with the plan and Code Status.  Code Status Full  Likely DC to  home  Condition GUARDED    Consults called: none    Admission status: inpatient    Time spent in minutes : 65 minutes   Phillips Climes M.D on 10/28/2020 at 5:13 PM   [facility] - Office  [phone]

## 2020-10-28 NOTE — ED Notes (Signed)
Patient is being uncooperative and refused to hold still for EKG. Stated that he was done with attempting to get an accurate reading at this time. Will send another RN to attempt if reading is not acceptable.

## 2020-10-29 DIAGNOSIS — I110 Hypertensive heart disease with heart failure: Secondary | ICD-10-CM | POA: Diagnosis not present

## 2020-10-29 NOTE — Progress Notes (Signed)
0230:  Attempted to get admission information from pt with no success.  Pt refused to answer most questions.  When asked about his medications, he stated that he had them all in his pants and that he wasn't giving them to me.  He said that he didn't trust anyone.  I explained to him that I wasn't going to take them from him to keep them, I was going to put them in his belongings so he wouldn't take them.  He became irate and stated that he was leaving. He said I am not senile and I won't take my pills without telling you that I took them.  I explained to him that he can't take any of his own medication that we will give to him from the pharmacy in the hospital.   Security was called and Dr. Clearence Ped was called and informed that pt wanted to leave.   0245:  Pt was counseled by doctor and still insisted on leaving.  Pt left hospital at this time with his medications and belongings.  Pt escorted out to his car by security.  AMA paper was signed and IV heplock was removed before discharge.

## 2020-10-29 NOTE — Progress Notes (Signed)
I personally spoke with patient about risks of leaving AMA vs benefits of staying for treatment. Patient reports understanding or risks, and was given the opportunity to have all questions answered. After extensive discussion, patient still decided to leave against medical advice, and reports that he will be back if he needs more treatment.

## 2020-10-31 ENCOUNTER — Emergency Department (HOSPITAL_COMMUNITY)
Admission: EM | Admit: 2020-10-31 | Discharge: 2020-10-31 | Discharge status: Against Medical Advice | Payer: Medicare Other

## 2020-11-01 ENCOUNTER — Ambulatory Visit: Payer: Medicare Other | Admitting: Urology

## 2020-11-01 DIAGNOSIS — N401 Enlarged prostate with lower urinary tract symptoms: Secondary | ICD-10-CM

## 2020-11-01 NOTE — Progress Notes (Incomplete)
H&P  Chief Complaint: ***  History of Present Illness: ***  Past Medical History:  Diagnosis Date   Anemia    Arthritis    Asthma    Cardiomyopathy ([place])    a. 2014: EF 40-45%, diffuse HK, and Grade 2 DD   Dyspnea    Emphysema    GERD (gastroesophageal reflux disease)    Hip pain    Hypertension    Peripheral vascular disease ([place])    Prostate disorder     Past Surgical History:  Procedure Laterality Date   ABDOMINAL SURGERY     CATARACT EXTRACTION W/PHACO Left 11/26/2016   Procedure: CATARACT EXTRACTION PHACO AND INTRAOCULAR LENS PLACEMENT LEFT EYE;  Surgeon: Tonny Branch, MD;  Location: [facility];  Service: Ophthalmology;  Laterality: Left;  CDE: 26.53    CATARACT EXTRACTION W/PHACO Right 01/28/2017   Procedure: CATARACT EXTRACTION PHACO AND INTRAOCULAR LENS PLACEMENT RIGHT EYE;  Surgeon: Tonny Branch, MD;  Location: [facility];  Service: Ophthalmology;  Laterality: Right;  CDE: 6.79   HERNIA REPAIR     LUNG SURGERY     MASS EXCISION Right 11/01/2017   Procedure: EXCISION CYST RIGHT KNEE;  Surgeon: Aviva Signs, MD;  Location: [facility];  Service: General;  Laterality: Right;   VASCULAR SURGERY      Home Medications:  Allergies as of 11/01/2020   No Known Allergies      Medication List        Accurate as of November 01, 2020  1:01 PM. If you have any questions, ask your nurse or doctor.          albuterol 108 (90 Base) MCG/ACT inhaler Commonly known as: ProAir HFA Inhale 2 puffs into the lungs every 4 (four) hours as needed for wheezing or shortness of breath.   Anoro Ellipta 62.5-25 MCG/ACT Aepb Generic drug: umeclidinium-vilanterol Inhale 1 puff into the lungs daily.   aspirin 81 MG chewable tablet Chew 81 mg by mouth daily.   cholecalciferol 25 MCG (1000 UNIT) tablet Commonly known as: VITAMIN D Take 1,000 Units by mouth daily.   digoxin 0.125 MG tablet Commonly known as: LANOXIN Take 125 mcg by mouth daily.   furosemide 20 MG tablet Commonly known as:  LASIX Take 20 mg by mouth daily.   losartan 25 MG tablet Commonly known as: COZAAR Take 1 tablet (25 mg total) by mouth daily. What changed: how much to take   metoprolol succinate 25 MG 24 hr tablet Commonly known as: TOPROL-XL Take 12.5 mg by mouth daily.   oxyCODONE 5 MG immediate release tablet Commonly known as: Oxy IR/ROXICODONE Take 5 mg by mouth at bedtime.   pantoprazole 40 MG tablet Commonly known as: PROTONIX Take 40 mg by mouth daily.   sildenafil 100 MG tablet Commonly known as: VIAGRA Take 1 tablet (100 mg total) by mouth daily as needed.   spironolactone 25 MG tablet Commonly known as: ALDACTONE Take 1 tablet by mouth daily.   Trelegy Ellipta 100-62.5-25 MCG/ACT Aepb Generic drug: Fluticasone-Umeclidin-Vilant Inhale 1 puff into the lungs daily.        Allergies: No Known Allergies  Family History  Problem Relation Age of Onset   Cancer Mother    Hypertension Mother    CVA Brother     Social History:  reports that he has been smoking cigarettes. He has a 25.00 pack-year smoking history. He has never used smokeless tobacco. He reports that he does not drink alcohol and does not use drugs.  ROS: A complete  review of systems was performed.  All systems are negative except for pertinent findings as noted.  Physical Exam:  Vital signs in last 24 hours: There were no vitals taken for this visit. Constitutional:  Alert and oriented, No acute distress Cardiovascular: Regular rate  Respiratory: Normal respiratory effort GI: Abdomen is soft, nontender, nondistended, no abdominal masses. No CVAT.  Genitourinary: Normal male phallus, testes are descended bilaterally and non-tender and without masses, scrotum is normal in appearance without lesions or masses, perineum is normal on inspection. Lymphatic: No lymphadenopathy Neurologic: Grossly intact, no focal deficits Psychiatric: Normal mood and affect  I have reviewed prior pt notes  I have reviewed  notes from referring/previous physicians  I have reviewed urinalysis results  I have independently reviewed prior imaging  I have reviewed prior PSA results  I have reviewed prior urine culture   Impression/Assessment:  ***  Plan:  ***

## 2020-11-10 ENCOUNTER — Other Ambulatory Visit: Payer: Self-pay

## 2020-11-10 ENCOUNTER — Encounter (HOSPITAL_COMMUNITY): Payer: Self-pay | Admitting: Physical Therapy

## 2020-11-10 ENCOUNTER — Ambulatory Visit (HOSPITAL_COMMUNITY): Payer: Medicare Other | Attending: Family Medicine | Admitting: Physical Therapy

## 2020-11-10 DIAGNOSIS — R262 Difficulty in walking, not elsewhere classified: Secondary | ICD-10-CM | POA: Diagnosis present

## 2020-11-10 DIAGNOSIS — S81802A Unspecified open wound, left lower leg, initial encounter: Secondary | ICD-10-CM | POA: Insufficient documentation

## 2020-11-10 DIAGNOSIS — S81801A Unspecified open wound, right lower leg, initial encounter: Secondary | ICD-10-CM | POA: Diagnosis not present

## 2020-11-10 NOTE — Therapy (Signed)
[place], [place], [address] Phone: [phone]   Fax:  [phone]  Wound Care Evaluation  Patient Details  Name: James Cervantes MRN: [id number] Date of Birth: 1950/04/20 No data recorded  Encounter Date: 11/10/2020   PT End of Session - 11/10/20 1434     Visit Number 1    Number of Visits 12    Date for PT Re-Evaluation 12/22/20    Authorization Type medicare primary and medicaid secondary    Progress Note Due on Visit 10    PT Start Time 1315    PT Stop Time 1430    PT Time Calculation (min) 75 min    Activity Tolerance Patient tolerated treatment well    Behavior During Therapy [facility] for tasks assessed/performed             Past Medical History:  Diagnosis Date   Anemia    Arthritis    Asthma    Cardiomyopathy ([place])    a. 2014: EF 40-45%, diffuse HK, and Grade 2 DD   Dyspnea    Emphysema    GERD (gastroesophageal reflux disease)    Hip pain    Hypertension    Peripheral vascular disease ([place])    Prostate disorder     Past Surgical History:  Procedure Laterality Date   ABDOMINAL SURGERY     CATARACT EXTRACTION W/PHACO Left 11/26/2016   Procedure: CATARACT EXTRACTION PHACO AND INTRAOCULAR LENS PLACEMENT LEFT EYE;  Surgeon: Tonny Branch, MD;  Location: [facility];  Service: Ophthalmology;  Laterality: Left;  CDE: 26.53    CATARACT EXTRACTION W/PHACO Right 01/28/2017   Procedure: CATARACT EXTRACTION PHACO AND INTRAOCULAR LENS PLACEMENT RIGHT EYE;  Surgeon: Tonny Branch, MD;  Location: [facility];  Service: Ophthalmology;  Laterality: Right;  CDE: 6.79   HERNIA REPAIR     LUNG SURGERY     MASS EXCISION Right 11/01/2017   Procedure: EXCISION CYST RIGHT KNEE;  Surgeon: Aviva Signs, MD;  Location: [facility];  Service: General;  Laterality: Right;   VASCULAR SURGERY      There were no vitals filed for this visit.     [facility] PT Assessment - 11/10/20 0001       Assessment   Medical Diagnosis B cellulitis  and wounds             Wound Therapy - 11/10/20 0001     Subjective States wounds/blisters have been there for months. Reports they are painful. States he hasn't done anythign for care of them.    Patient and Family Stated Goals to have wounds heal    Date of Onset --   >89month   Prior Treatments none - comes with xeroform and gauze on them    Pain Scale Faces    Faces Pain Scale Hurts whole lot    Pain Type Acute pain    Pain Location Leg    Pain Orientation Right;Left    Pain Descriptors / Indicators Burning    Pain Onset With Activity    Patients Stated Pain Goal 0    Pain Intervention(s) Repositioned    Evaluation and Treatment Procedures Explained to Patient/Family Yes    Evaluation and Treatment Procedures agreed to    Wound Properties Date First Assessed: 11/10/20 Time First Assessed: 1245 Wound Type: Diabetic ulcer Location: Pretibial Location Orientation: Right Wound Description (Comments): lateral largest wound Present on Admission: Yes   Dressing Type Gauze (Comment);Impregnated gauze (bismuth)    Dressing Changed  Changed    Dressing Status Old drainage    Dressing Change Frequency PRN    Site / Wound Assessment Bleeding;Granulation tissue;Red;Pink    % Wound base Red or Granulating 85%    % Wound base Yellow/Fibrinous Exudate 15%    Peri-wound Assessment Intact;Edema    Wound Length (cm) 8 cm    Wound Width (cm) 7.2 cm    Wound Surface Area (cm^2) 57.6 cm^2    Margins Unattached edges (unapproximated)    Drainage Amount Copious    Drainage Description Serosanguineous    Treatment Cleansed;Debridement (Selective)    Wound Properties Date First Assessed: 11/10/20 Time First Assessed: 1315 Wound Type: Diabetic ulcer Location: Pretibial Location Orientation: Right Wound Description (Comments): medial wound Present on Admission: Yes   Dressing Type Impregnated gauze (bismuth);Gauze (Comment)    Dressing Changed Changed    Dressing Status Old drainage    Dressing  Change Frequency PRN    Site / Wound Assessment Yellow    % Wound base Yellow/Fibrinous Exudate 100%    Peri-wound Assessment Edema    Wound Length (cm) 3.4 cm    Wound Width (cm) 2.5 cm    Wound Surface Area (cm^2) 8.5 cm^2    Margins Attached edges (approximated)    Drainage Amount Copious    Drainage Description Serosanguineous    Treatment Cleansed;Debridement (Selective)    Wound Properties Date First Assessed: 11/10/20 Time First Assessed: 1315 Wound Type: Diabetic ulcer Location: Pretibial Location Orientation: Left Wound Description (Comments): wound on left leg Present on Admission: Yes   Dressing Type Gauze (Comment);Impregnated gauze (bismuth)    Dressing Changed Changed    Dressing Status Old drainage    Dressing Change Frequency PRN    Site / Wound Assessment Yellow    % Wound base Yellow/Fibrinous Exudate 100%    Wound Length (cm) 1.5 cm    Wound Width (cm) 2 cm    Wound Surface Area (cm^2) 3 cm^2    Tunneling (cm) Patient with total of 6 wounds on right leg    Margins Attached edges (approximated)    Drainage Amount Moderate    Drainage Description Serosanguineous    Treatment Cleansed;Debridement (Selective)    Selective Debridement - Location B LE    Selective Debridement - Tools Used Forceps;Scalpel;Scissors    Selective Debridement - Tissue Removed devitalized tissue    Wound Therapy - Clinical Statement Patient presents to therapy with complaints of bilateral lower extremity pain and wounds. Blisters on right leg that had been present for >1 week. and open wounds on both lower legs that are limiting patient's ability to sleep, walk and bath. Debrided devitalized tissue and thoroughly cleansed wounds. Weeping noted towards end of session. Liberally applied Vaseline, then xeroform and alginate. Concerned about patient's history of CHF with compression, and about excessive drainage, educated patient on how and why to remove bandages. Patient would greatly benefit from  skilled PT to help assist with wound healing that has not healed due to swelling and multiple comorbidities.    Wound Therapy - Functional Problem List walking, bathing, dressing    Factors Delaying/Impairing Wound Healing Multiple medical problems   CHF   Wound Therapy - Frequency 2X / week    Wound Therapy - Current Recommendations PT    Wound Plan debride as indicated, redress    Dressing  B LE: xerofrom to wound beds, alginate arounds wounds, cotton, kjerlix and LIGHT coban, netting #5  Objective measurements completed on examination: See above findings.             PT Education - 11/10/20 1435     Education Details on wound care, on how/when to doff dressings.    Person(s) Educated Patient    Methods Explanation    Comprehension Verbalized understanding              PT Short Term Goals - 11/10/20 1436       PT SHORT TERM GOAL #1   Title wounds on right will be 100% granulated    Time 3    Period Weeks    Status New    Target Date 12/01/20      PT SHORT TERM GOAL #2   Title wound on left will be 100% granulated    Time 3    Period Weeks    Status New    Target Date 12/01/20      PT SHORT TERM GOAL #3   Title Patient will have < 3 wounds on right leg    Time 3    Period Weeks    Status New    Target Date 12/01/20               PT Long Term Goals - 11/10/20 1437       PT LONG TERM GOAL #1   Title wounds on right will be completely healed    Time 6    Period Weeks    Status New    Target Date 12/22/20      PT LONG TERM GOAL #2   Title wounds on left will be completely healed    Time 6    Period Weeks    Status New    Target Date 12/22/20      PT LONG TERM GOAL #3   Title Patietn will report importance of wearing compression garments daily    Time 6    Period Weeks    Status New    Target Date 12/22/20                  Plan - 11/10/20 1438     Personal Factors and Comorbidities Comorbidity  1;Comorbidity 2;Comorbidity 3+    Comorbidities COPD, CHF, EF 25%    Examination-Activity Limitations Stairs;Bed Mobility;Locomotion Level;Hygiene/Grooming    Stability/Clinical Decision Making Evolving/Moderate complexity    Clinical Decision Making Moderate    Rehab Potential Fair    PT Frequency 2x / week    PT Duration 6 weeks    PT Treatment/Interventions ADLs/Self Care Home Management;Therapeutic exercise;Manual techniques;Other (comment)   wound care and modalities as needed   PT Home Exercise Plan ankle pumps    Consulted and Agree with Plan of Care Patient             Patient will benefit from skilled therapeutic intervention in order to improve the following deficits and impairments:  Pain, Difficulty walking, Decreased skin integrity, Increased edema, Decreased scar mobility, Decreased knowledge of precautions  Visit Diagnosis: Multiple open wounds of right lower leg  Wound of left lower extremity, initial encounter  Difficulty in walking, not elsewhere classified    Problem List Patient Active Problem List   Diagnosis Date Noted   Acute on chronic systolic CHF (congestive heart failure) ([place]) 10/28/2020   Noncompliance 12/16/2017   Elevated troponin 12/16/2017   Bronchitis 12/16/2017   COPD with acute exacerbation ([place]) 12/15/2017   Lipoma of extremity    Secondary cardiomyopathy (  [place]) 10/14/2012   Atypical chest pain 10/13/2012   Chest pain 10/12/2012   Abnormal EKG 10/12/2012   GERD (gastroesophageal reflux disease) 10/12/2012   UNSPECIFIED ANEMIA 05/18/2008   ERECTILE DYSFUNCTION 09/25/2007   UNS PULMONARY TB-CULT DX 06/19/2007   UNDERWEIGHT 06/19/2007   OSTEOARTHRITIS, HIP, RIGHT 02/18/2006   HEPATITIS C 01/08/2006   DISORDER, TOBACCO USE 01/08/2006   MIGRAINE HEADACHE 01/08/2006   ALLERGIC RHINITIS 01/08/2006   GERD 01/08/2006   BPH (benign prostatic hyperplasia) 01/08/2006   OSTEOARTHRITIS 01/08/2006   3:29 PM, 11/10/20 Jerene Pitch,  DPT Physical Therapy with [facility]  [phone] office   [place] [address] [place], [place], [address] Phone: [phone]   Fax:  [phone]  Name: James Cervantes MRN: [id number] Date of Birth: 02/13/1950

## 2020-11-14 ENCOUNTER — Ambulatory Visit (HOSPITAL_COMMUNITY): Payer: Medicare Other | Admitting: Physical Therapy

## 2020-11-14 ENCOUNTER — Other Ambulatory Visit: Payer: Self-pay

## 2020-11-14 ENCOUNTER — Telehealth (HOSPITAL_COMMUNITY): Payer: Self-pay | Admitting: Physical Therapy

## 2020-11-14 DIAGNOSIS — S81801A Unspecified open wound, right lower leg, initial encounter: Secondary | ICD-10-CM

## 2020-11-14 DIAGNOSIS — S81802A Unspecified open wound, left lower leg, initial encounter: Secondary | ICD-10-CM

## 2020-11-14 DIAGNOSIS — R262 Difficulty in walking, not elsewhere classified: Secondary | ICD-10-CM

## 2020-11-14 NOTE — Therapy (Signed)
[place], [place], [address] Phone: [phone]   Fax:  [phone]  Wound Care Therapy  Patient Details  Name: James Cervantes MRN: [id number] Date of Birth: Sep 20, 1950 No data recorded  Encounter Date: 11/14/2020   PT End of Session - 11/14/20 1356     Visit Number 2    Number of Visits 12    Date for PT Re-Evaluation 12/22/20    Authorization Type medicare primary and medicaid secondary    Progress Note Due on Visit 10    PT Start Time 1048    PT Stop Time 9379    PT Time Calculation (min) 57 min    Activity Tolerance Patient tolerated treatment well    Behavior During Therapy [facility] for tasks assessed/performed             Past Medical History:  Diagnosis Date   Anemia    Arthritis    Asthma    Cardiomyopathy ([place])    a. 2014: EF 40-45%, diffuse HK, and Grade 2 DD   Dyspnea    Emphysema    GERD (gastroesophageal reflux disease)    Hip pain    Hypertension    Peripheral vascular disease ([place])    Prostate disorder     Past Surgical History:  Procedure Laterality Date   ABDOMINAL SURGERY     CATARACT EXTRACTION W/PHACO Left 11/26/2016   Procedure: CATARACT EXTRACTION PHACO AND INTRAOCULAR LENS PLACEMENT LEFT EYE;  Surgeon: Tonny Branch, MD;  Location: [facility];  Service: Ophthalmology;  Laterality: Left;  CDE: 26.53    CATARACT EXTRACTION W/PHACO Right 01/28/2017   Procedure: CATARACT EXTRACTION PHACO AND INTRAOCULAR LENS PLACEMENT RIGHT EYE;  Surgeon: Tonny Branch, MD;  Location: [facility];  Service: Ophthalmology;  Laterality: Right;  CDE: 6.79   HERNIA REPAIR     LUNG SURGERY     MASS EXCISION Right 11/01/2017   Procedure: EXCISION CYST RIGHT KNEE;  Surgeon: Aviva Signs, MD;  Location: [facility];  Service: General;  Laterality: Right;   VASCULAR SURGERY      There were no vitals filed for this visit.               Wound Therapy - 11/14/20 0001     Subjective Pt did not show for  earlier appt and was able to be worked into schedule for later appt today.  Pt states his legs hurt and is worse when down.  STates he never props them up when sitting.    Pain Scale Faces    Pain Score 0-No pain    Faces Pain Scale Hurts even more    Pain Type Acute pain    Pain Location Leg    Pain Orientation Right;Left    Pain Descriptors / Indicators Burning    Pain Onset With Activity    Patients Stated Pain Goal 0    Wound Properties Date First Assessed: 11/10/20 Time First Assessed: 1315 Wound Type: Diabetic ulcer Location: Pretibial Location Orientation: Right Wound Description (Comments): lateral largest wound Present on Admission: Yes   Wound Image Images linked: 1    Dressing Type Impregnated gauze (bismuth)    Dressing Changed Changed    Dressing Status Old drainage    Dressing Change Frequency PRN    Site / Wound Assessment Yellow;Red;Painful    % Wound base Red or Granulating 100%   after debridement only 10% prior to debridement   Peri-wound Assessment Intact;Induration;Maceration    Margins  Unattached edges (unapproximated)    Drainage Amount Copious    Drainage Description Serous    Treatment Cleansed;Debridement (Selective)    Wound Properties Date First Assessed: 11/10/20 Time First Assessed: 1638 Wound Type: Diabetic ulcer Location: Pretibial Location Orientation: Right Wound Description (Comments): medial wound Present on Admission: Yes   Wound Image Images linked: 1    Dressing Type Impregnated gauze (bismuth)    Dressing Changed Changed    Dressing Status Dry;Intact    Dressing Change Frequency PRN    Site / Wound Assessment Yellow    % Wound base Yellow/Fibrinous Exudate 100%    Peri-wound Assessment Intact;Induration    Margins Unattached edges (unapproximated)    Drainage Amount Scant    Drainage Description Serous    Treatment Cleansed;Debridement (Selective)    Wound Properties Date First Assessed: 11/10/20 Time First Assessed: 4536 Wound Type:  Diabetic ulcer Location: Pretibial Location Orientation: Left Wound Description (Comments): wound on left leg Present on Admission: Yes   Wound Image Images linked: 1    Dressing Type Gauze (Comment)    Dressing Changed Changed    Dressing Status Old drainage    Dressing Change Frequency PRN    Site / Wound Assessment Pink;Pale    % Wound base Red or Granulating 80%    % Wound base Yellow/Fibrinous Exudate 20%    Peri-wound Assessment Intact;Induration    Drainage Amount Minimal    Drainage Description Serous    Treatment Cleansed;Debridement (Selective)    Selective Debridement - Location B LE    Selective Debridement - Tools Used Forceps;Scalpel;Scissors    Selective Debridement - Tissue Removed devitalized tissue    Wound Therapy - Clinical Statement Pt stating his leg are hurting and the drainage has come through the Rt LE dressings.  States he would like them off as soon as possible.  Noted reduction in edema in bil LE's but continues to weep; Rt more so than Lt LE.    ABle to remove all remaining slough covering primary wound on lateral LE as well as the two small ones above that one and medial one.  Pt with total of 5 wounds on Rt  LE .  Most superior medial one contineus to be covered with 100% hard eschar.  Lt LE wound cleaned up well.  Induration remains lower into upper LE's. Instructed to bring his debit card and we would order new compression stockings next  session during his apointment.  Educated to elevate his LE's when sitting.  Given new printout with future appoitments highlighted.    Wound Therapy - Functional Problem List walking, bathing, dressing    Factors Delaying/Impairing Wound Healing Multiple medical problems    Wound Therapy - Frequency 2X / week    Wound Therapy - Current Recommendations PT    Wound Plan debride as indicated, redress    Dressing  B LE: xerofrom to wound beds, alginate arounds wounds, profore lite with #1/#2 reversed; cotton applied to negative  area.                     PT Education - 11/14/20 1359     Education Details importance of compression, elevating LE's, keeping up with appoitnments.    Person(s) Educated Patient    Methods Explanation    Comprehension Verbalized understanding              PT Short Term Goals - 11/10/20 1436       PT SHORT TERM GOAL #1  Title wounds on right will be 100% granulated    Time 3    Period Weeks    Status New    Target Date 12/01/20      PT SHORT TERM GOAL #2   Title wound on left will be 100% granulated    Time 3    Period Weeks    Status New    Target Date 12/01/20      PT SHORT TERM GOAL #3   Title Patient will have < 3 wounds on right leg    Time 3    Period Weeks    Status New    Target Date 12/01/20               PT Long Term Goals - 11/10/20 1437       PT LONG TERM GOAL #1   Title wounds on right will be completely healed    Time 6    Period Weeks    Status New    Target Date 12/22/20      PT LONG TERM GOAL #2   Title wounds on left will be completely healed    Time 6    Period Weeks    Status New    Target Date 12/22/20      PT LONG TERM GOAL #3   Title Patietn will report importance of wearing compression garments daily    Time 6    Period Weeks    Status New    Target Date 12/22/20                    Patient will benefit from skilled therapeutic intervention in order to improve the following deficits and impairments:     Visit Diagnosis: Multiple open wounds of right lower leg  Difficulty in walking, not elsewhere classified  Wound of left lower extremity, initial encounter     Problem List Patient Active Problem List   Diagnosis Date Noted   Acute on chronic systolic CHF (congestive heart failure) ([place]) 10/28/2020   Noncompliance 12/16/2017   Elevated troponin 12/16/2017   Bronchitis 12/16/2017   COPD with acute exacerbation ([place]) 12/15/2017   Lipoma of extremity    Secondary cardiomyopathy  ([place]) 10/14/2012   Atypical chest pain 10/13/2012   Chest pain 10/12/2012   Abnormal EKG 10/12/2012   GERD (gastroesophageal reflux disease) 10/12/2012   UNSPECIFIED ANEMIA 05/18/2008   ERECTILE DYSFUNCTION 09/25/2007   UNS PULMONARY TB-CULT DX 06/19/2007   UNDERWEIGHT 06/19/2007   OSTEOARTHRITIS, HIP, RIGHT 02/18/2006   HEPATITIS C 01/08/2006   DISORDER, TOBACCO USE 01/08/2006   MIGRAINE HEADACHE 01/08/2006   ALLERGIC RHINITIS 01/08/2006   GERD 01/08/2006   BPH (benign prostatic hyperplasia) 01/08/2006   OSTEOARTHRITIS 01/08/2006   Kaiven Vester [place], PTA/CLT, WTA [phone]  Roseanne Reno B 11/14/2020, 2:00 PM  [place] [address] [place], [place], [address] Phone: [phone]   Fax:  [phone]  Name: James Cervantes MRN: [id number] Date of Birth: 10-19-1950

## 2020-11-14 NOTE — Progress Notes (Deleted)
Cardiology Office Note  Date: 11/14/2020   ID: James Cervantes, DOB 1950-08-11, MRN [id number]  PCP:  Merryl Hacker, No  Cardiologist:  Rozann Lesches, MD Electrophysiologist:  None   Chief Complaint: CHF per PCP  History of Present Illness: James Cervantes is a 70 y.o. male with a history of chronic systolic heart failure, secondary cardiomyopathy, COPD, GERD, BPH, hepatitis C, tobacco use disorder, chest pain, abnormal EKG, noncompliance, GERD, anemia, asthma.  Echocardiogram May 2022 demonstrated EF of 20 to 25%.   He presented to [facility] ED on 10/28/2020 with chief complaint of lower extremity edema with noted 3+ pitting edema getting worse.  He describes swelling in his ankles extending into thighs and scrotum.  He complained of shortness of breath stating he could not walk across the room without becoming shortness of breath.  Also complained of difficulty laying flat secondary to shortness of breath.  He was he was currently taking HCTZ 40 mg without improvement in symptoms.  Review of chart indicated he should be on spironolactone and Lasix.  It was unclear when these medications were changed.  His BNP was 3017.  Troponin was 18.  Chest x-ray on 10/28/2020 demonstrated stable postsurgical changes involving the left hemothorax with associated volume loss mediastinal shift, minimal right pleural effusion with minimal right basilar scarring or atelectasis.  He was admitted under CHF pathway he was to start on Lasix 40 mg IV twice daily.  He was to continue home medication including digoxin, Toprol, losartan, spironolactone.  He was continuing to smoke stated 1 pack would last 4 days.  History of COPD without active wheezing.  He was to continue home medications.  Limits of hypokalemia which was being repleted.  He was to continue losartan and metoprolol.  He apparently became uncooperative and decided to leave AMA.  Past Medical History:  Diagnosis Date   Anemia    Arthritis    Asthma     Cardiomyopathy ([place])    a. 2014: EF 40-45%, diffuse HK, and Grade 2 DD   Dyspnea    Emphysema    GERD (gastroesophageal reflux disease)    Hip pain    Hypertension    Peripheral vascular disease ([place])    Prostate disorder     Past Surgical History:  Procedure Laterality Date   ABDOMINAL SURGERY     CATARACT EXTRACTION W/PHACO Left 11/26/2016   Procedure: CATARACT EXTRACTION PHACO AND INTRAOCULAR LENS PLACEMENT LEFT EYE;  Surgeon: Tonny Branch, MD;  Location: [facility];  Service: Ophthalmology;  Laterality: Left;  CDE: 26.53    CATARACT EXTRACTION W/PHACO Right 01/28/2017   Procedure: CATARACT EXTRACTION PHACO AND INTRAOCULAR LENS PLACEMENT RIGHT EYE;  Surgeon: Tonny Branch, MD;  Location: [facility];  Service: Ophthalmology;  Laterality: Right;  CDE: 6.79   HERNIA REPAIR     LUNG SURGERY     MASS EXCISION Right 11/01/2017   Procedure: EXCISION CYST RIGHT KNEE;  Surgeon: Aviva Signs, MD;  Location: [facility];  Service: General;  Laterality: Right;   VASCULAR SURGERY      Current Outpatient Medications  Medication Sig Dispense Refill   albuterol (PROAIR HFA) 108 (90 Base) MCG/ACT inhaler Inhale 2 puffs into the lungs every 4 (four) hours as needed for wheezing or shortness of breath. 8 g 1   ANORO ELLIPTA 62.5-25 MCG/INH AEPB Inhale 1 puff into the lungs daily.     aspirin 81 MG chewable tablet Chew 81 mg by mouth daily. (Patient not taking: No sig reported)  cholecalciferol (VITAMIN D) 25 MCG (1000 UNIT) tablet Take 1,000 Units by mouth daily. (Patient not taking: Reported on 10/28/2020)     digoxin (LANOXIN) 0.125 MG tablet Take 125 mcg by mouth daily. (Patient not taking: Reported on 10/28/2020)     furosemide (LASIX) 20 MG tablet Take 20 mg by mouth daily. (Patient not taking: No sig reported)     losartan (COZAAR) 25 MG tablet Take 1 tablet (25 mg total) by mouth daily. (Patient taking differently: Take 12.5 mg by mouth daily.) 90 tablet 3   metoprolol succinate (TOPROL-XL) 25 MG 24 hr  tablet Take 12.5 mg by mouth daily.     oxyCODONE (OXY IR/ROXICODONE) 5 MG immediate release tablet Take 5 mg by mouth at bedtime.      pantoprazole (PROTONIX) 40 MG tablet Take 40 mg by mouth daily.     sildenafil (VIAGRA) 100 MG tablet Take 1 tablet (100 mg total) by mouth daily as needed. (Patient not taking: No sig reported) 10 tablet 0   spironolactone (ALDACTONE) 25 MG tablet Take 1 tablet by mouth daily. (Patient not taking: No sig reported)     TRELEGY ELLIPTA 100-62.5-25 MCG/INH AEPB Inhale 1 puff into the lungs daily. (Patient not taking: Reported on 10/28/2020)     No current facility-administered medications for this visit.   Allergies:  Patient has no known allergies.   Social History: The patient  reports that he has been smoking cigarettes. He has a 25.00 pack-year smoking history. He has never used smokeless tobacco. He reports that he does not drink alcohol and does not use drugs.   Family History: The patient's family history includes CVA in his brother; Cancer in his mother; Hypertension in his mother.   ROS:  Please see the history of present illness. Otherwise, complete review of systems is positive for {NONE DEFAULTED:18576}.  All other systems are reviewed and negative.   Physical Exam: VS:  There were no vitals taken for this visit., BMI There is no height or weight on file to calculate BMI.  Wt Readings from Last 3 Encounters:  10/29/20 139 lb 15.9 oz (63.5 kg)  10/15/20 140 lb (63.5 kg)  06/27/20 117 lb (53.1 kg)    General: Patient appears comfortable at rest. HEENT: Conjunctiva and lids normal, oropharynx clear with moist mucosa. Neck: Supple, no elevated JVP or carotid bruits, no thyromegaly. Lungs: Clear to auscultation, nonlabored breathing at rest. Cardiac: Regular rate and rhythm, no S3 or significant systolic murmur, no pericardial rub. Abdomen: Soft, nontender, no hepatomegaly, bowel sounds present, no guarding or rebound. Extremities: No pitting  edema, distal pulses 2+. Skin: Warm and dry. Musculoskeletal: No kyphosis. Neuropsychiatric: Alert and oriented x3, affect grossly appropriate.  ECG:  {EKG/Telemetry Strips Reviewed:[phone]}  Recent Labwork: 10/28/2020: ALT 23; AST 35; B Natriuretic Peptide 3,017.0; BUN 20; Creatinine, Ser 1.30; Hemoglobin 11.5; Platelets 283; Potassium 3.4; Sodium 140     Component Value Date/Time   CHOL 118 12/16/2017 0504   TRIG 31 12/16/2017 0504   HDL 71 12/16/2017 0504   CHOLHDL 1.7 12/16/2017 0504   VLDL 6 12/16/2017 0504   LDLCALC 41 12/16/2017 0504    Other Studies Reviewed Today:  2D echo June 15, 2020 IMPRESSIONS   1. Left ventricular ejection fraction, by estimation, is 20 to 25%. The  left ventricle has severely decreased function. The left ventricle  demonstrates global hypokinesis. The left ventricular internal cavity size  was mildly dilated. There is mild left  ventricular hypertrophy. Left ventricular diastolic parameters are  consistent with Grade II diastolic dysfunction (pseudonormalization).   2. Right ventricular systolic function is moderately reduced. The right  ventricular size is normal. There is moderately elevated pulmonary artery  systolic pressure. The estimated right ventricular systolic pressure is  57.2 mmHg.   3. Left atrial size was severely dilated.   4. There is a trivial pericardial effusion posterior to the left  ventricle.   5. The mitral valve is grossly normal. Mild mitral valve regurgitation.   6. The aortic valve is tricuspid. Aortic valve regurgitation is mild.  Aortic regurgitation PHT measures 425 msec.   7. The inferior vena cava is normal in size with greater than 50%  respiratory variability, suggesting right atrial pressure of 3 mmHg.    Assessment and Plan:  1. Acute on chronic combined systolic and diastolic CHF (congestive heart failure) ([place])   2. Secondary cardiomyopathy ([place])   3. History of COPD   4. Tobacco abuse    1. Acute  on chronic combined systolic and diastolic CHF (congestive heart failure) ([facility]) ***  2. Secondary cardiomyopathy ([facility]) ***  3. History of COPD ***  4. Tobacco abuse ***   Medication Adjustments/Labs and Tests Ordered: Current medicines are reviewed at length with the patient today.  Concerns regarding medicines are outlined above.   Disposition: Follow-up with ***  Signed, Levell July, NP 11/14/2020 9:29 PM    [place] [facility] [place], [place], [place] [address] Phone: [phone]; Fax: [phone]

## 2020-11-14 NOTE — Telephone Encounter (Signed)
NS. Called patient about missed apt and he reported he was still planning on coming up here. Discussed how patient needs apt to be seen and cannot just show up at any time and be seen. Patient reports that bandages are wet and painful. Reminded patient about previous apt and what was instructed to him in regards to taking off bandages if wet or too painful. Scheduled patient for later time today but stressed getting here on time for treatment. Reminded patient of location of clinic as he was confused where to go.  9:51 AM, 11/14/20 Jerene Pitch, DPT Physical Therapy with [facility]  [phone] office

## 2020-11-15 ENCOUNTER — Ambulatory Visit: Payer: Medicare Other | Admitting: Family Medicine

## 2020-11-15 DIAGNOSIS — I5043 Acute on chronic combined systolic (congestive) and diastolic (congestive) heart failure: Secondary | ICD-10-CM

## 2020-11-15 DIAGNOSIS — Z720 Tobacco use: Secondary | ICD-10-CM

## 2020-11-15 DIAGNOSIS — Z8709 Personal history of other diseases of the respiratory system: Secondary | ICD-10-CM

## 2020-11-15 DIAGNOSIS — I429 Cardiomyopathy, unspecified: Secondary | ICD-10-CM

## 2020-11-16 ENCOUNTER — Ambulatory Visit (HOSPITAL_COMMUNITY): Payer: Medicare Other | Admitting: Physical Therapy

## 2020-11-16 ENCOUNTER — Other Ambulatory Visit: Payer: Self-pay

## 2020-11-16 DIAGNOSIS — S81802A Unspecified open wound, left lower leg, initial encounter: Secondary | ICD-10-CM

## 2020-11-16 DIAGNOSIS — S81801A Unspecified open wound, right lower leg, initial encounter: Secondary | ICD-10-CM

## 2020-11-16 DIAGNOSIS — R262 Difficulty in walking, not elsewhere classified: Secondary | ICD-10-CM

## 2020-11-16 NOTE — Therapy (Signed)
[facility] [place], [place], [address] Phone: [phone]   Fax:  [phone]  Wound Care Therapy  Patient Details  Name: James Cervantes MRN: [id number] Date of Birth: 1950-10-28 No data recorded  Encounter Date: 11/16/2020   PT End of Session - 11/16/20 1504     Visit Number 3    Number of Visits 12    Date for PT Re-Evaluation 12/22/20    Authorization Type medicare primary and medicaid secondary    Progress Note Due on Visit 10    PT Start Time 1300    PT Stop Time 1400    PT Time Calculation (min) 60 min    Activity Tolerance Patient tolerated treatment well    Behavior During Therapy [facility] for tasks assessed/performed             Past Medical History:  Diagnosis Date   Anemia    Arthritis    Asthma    Cardiomyopathy ([place])    a. 2014: EF 40-45%, diffuse HK, and Grade 2 DD   Dyspnea    Emphysema    GERD (gastroesophageal reflux disease)    Hip pain    Hypertension    Peripheral vascular disease ([place])    Prostate disorder     Past Surgical History:  Procedure Laterality Date   ABDOMINAL SURGERY     CATARACT EXTRACTION W/PHACO Left 11/26/2016   Procedure: CATARACT EXTRACTION PHACO AND INTRAOCULAR LENS PLACEMENT LEFT EYE;  Surgeon: Tonny Branch, MD;  Location: [facility];  Service: Ophthalmology;  Laterality: Left;  CDE: 26.53    CATARACT EXTRACTION W/PHACO Right 01/28/2017   Procedure: CATARACT EXTRACTION PHACO AND INTRAOCULAR LENS PLACEMENT RIGHT EYE;  Surgeon: Tonny Branch, MD;  Location: [facility];  Service: Ophthalmology;  Laterality: Right;  CDE: 6.79   HERNIA REPAIR     LUNG SURGERY     MASS EXCISION Right 11/01/2017   Procedure: EXCISION CYST RIGHT KNEE;  Surgeon: Aviva Signs, MD;  Location: [facility];  Service: General;  Laterality: Right;   VASCULAR SURGERY      There were no vitals filed for this visit.               Wound Therapy - 11/16/20 0001     Subjective pt did not show for  morning appt; called and got pateint in pm.  Pt states he has swelling in his thighs now and a huge blister came up on his Rt knee (see media) Pt states his legs do feel better with the compression.    Pain Scale Faces    Pain Score 0-No pain    Faces Pain Scale Hurts little more    Pain Type Acute pain    Pain Location Leg    Pain Orientation Right;Left    Pain Descriptors / Indicators Burning    Pain Onset With Activity    Evaluation and Treatment Procedures Explained to Patient/Family Yes    Evaluation and Treatment Procedures agreed to    Wound Properties Date First Assessed: 11/10/20 Time First Assessed: 6962 Wound Type: Diabetic ulcer Location: Pretibial Location Orientation: Right Wound Description (Comments): lateral largest wound Present on Admission: Yes   Dressing Type Impregnated gauze (bismuth);Alginate    Dressing Changed Changed    Dressing Status Old drainage    Dressing Change Frequency PRN    Site / Wound Assessment Pink;Red    % Wound base Red or Granulating 100%    Peri-wound Assessment Intact  Margins Attached edges (approximated)    Drainage Amount Moderate    Drainage Description Serous    Treatment Cleansed;Debridement (Selective)    Wound Properties Date First Assessed: 11/10/20 Time First Assessed: 1315 Wound Type: Diabetic ulcer Location: Pretibial Location Orientation: Right Wound Description (Comments): medial wound Present on Admission: Yes   Dressing Type Impregnated gauze (bismuth)    Dressing Changed Changed    Dressing Status Intact    Dressing Change Frequency PRN    Site / Wound Assessment Red    % Wound base Red or Granulating 100%    Peri-wound Assessment Intact    Margins Attached edges (approximated)    Drainage Amount Scant    Drainage Description Serous    Treatment Cleansed;Debridement (Selective)    Wound Properties Date First Assessed: 11/10/20 Time First Assessed: 1315 Wound Type: Diabetic ulcer Location: Pretibial Location  Orientation: Left Wound Description (Comments): wound on left leg Present on Admission: Yes   Dressing Type Impregnated gauze (bismuth)    Dressing Changed Changed    Dressing Status Old drainage    Dressing Change Frequency Other (Comment)    Site / Wound Assessment Red;Pink;Yellow    % Wound base Red or Granulating 85%    % Wound base Yellow/Fibrinous Exudate 15%    Peri-wound Assessment Intact    Margins Attached edges (approximated)    Drainage Amount Scant    Drainage Description Serous    Treatment Cleansed;Debridement (Selective)    Selective Debridement - Location B LE    Selective Debridement - Tools Used Forceps    Selective Debridement - Tissue Removed devitalized tissue    Wound Therapy - Clinical Statement Pt states he can tell his legs are better and with less pain below but concerned with the swelling in his thighs and up into his groin.  States he also cannot lay down because he feels the swelling is coming up and acid from his stomach.  Encouraged pt to contract MD for appointment as his meds may need to be adjusted.  Pt also expresses he would like to go into ALF because he is alone and has no one to help him with his needs. Pt feels he could get more medical assistance in a facility as well and would take his meds correctly.  Instructed pt to tell his PCP this as well.   Noted further reduction in edema and considerably less drainage.  Large blister medial Rt knee region that was expressed and ABD pad placed over to absorb drainage.  Wounds remain mostly granulated following removal of thin slough layer and noted reduction in size of all wounds with total of 5 wounds on Rt LE with additional blister medial knee. Therapist unable to reach ETI to order thigh highs but will try again in the morning to order for patient. Continued Education to elevate his LE's when sitting.  Given new printout with next appt.    Wound Therapy - Functional Problem List walking, bathing, dressing     Factors Delaying/Impairing Wound Healing Multiple medical problems    Wound Therapy - Frequency 2X / week    Wound Therapy - Current Recommendations PT    Wound Plan debride as indicated, redress.  measure weekly (thursday or friday)    Dressing  B LE: xerofrom to wound beds, alginate arounds wounds, profore lite with #1/#2 reversed; cotton applied to negative area.  PT Short Term Goals - 11/10/20 1436       PT SHORT TERM GOAL #1   Title wounds on right will be 100% granulated    Time 3    Period Weeks    Status New    Target Date 12/01/20      PT SHORT TERM GOAL #2   Title wound on left will be 100% granulated    Time 3    Period Weeks    Status New    Target Date 12/01/20      PT SHORT TERM GOAL #3   Title Patient will have < 3 wounds on right leg    Time 3    Period Weeks    Status New    Target Date 12/01/20               PT Long Term Goals - 11/10/20 1437       PT LONG TERM GOAL #1   Title wounds on right will be completely healed    Time 6    Period Weeks    Status New    Target Date 12/22/20      PT LONG TERM GOAL #2   Title wounds on left will be completely healed    Time 6    Period Weeks    Status New    Target Date 12/22/20      PT LONG TERM GOAL #3   Title Patietn will report importance of wearing compression garments daily    Time 6    Period Weeks    Status New    Target Date 12/22/20                    Patient will benefit from skilled therapeutic intervention in order to improve the following deficits and impairments:     Visit Diagnosis: Wound of left lower extremity, initial encounter  Multiple open wounds of right lower leg  Difficulty in walking, not elsewhere classified     Problem List Patient Active Problem List   Diagnosis Date Noted   Acute on chronic systolic CHF (congestive heart failure) ([place]) 10/28/2020   Noncompliance 12/16/2017   Elevated troponin 12/16/2017    Bronchitis 12/16/2017   COPD with acute exacerbation ([place]) 12/15/2017   Lipoma of extremity    Secondary cardiomyopathy ([place]) 10/14/2012   Atypical chest pain 10/13/2012   Chest pain 10/12/2012   Abnormal EKG 10/12/2012   GERD (gastroesophageal reflux disease) 10/12/2012   UNSPECIFIED ANEMIA 05/18/2008   ERECTILE DYSFUNCTION 09/25/2007   UNS PULMONARY TB-CULT DX 06/19/2007   UNDERWEIGHT 06/19/2007   OSTEOARTHRITIS, HIP, RIGHT 02/18/2006   HEPATITIS C 01/08/2006   DISORDER, TOBACCO USE 01/08/2006   MIGRAINE HEADACHE 01/08/2006   ALLERGIC RHINITIS 01/08/2006   GERD 01/08/2006   BPH (benign prostatic hyperplasia) 01/08/2006   OSTEOARTHRITIS 01/08/2006   Qamar Rosman S[place] Soda, PTA/CLT, WTA [phone]  Roseanne Reno B 11/16/2020, 3:06 PM  [facility] [address] [place], [place], [address] Phone: [phone]   Fax:  [phone]  Name: COEN MIYASATO MRN: [id number] Date of Birth: 05-22-1950

## 2020-11-18 ENCOUNTER — Other Ambulatory Visit: Payer: Self-pay

## 2020-11-18 ENCOUNTER — Telehealth (HOSPITAL_COMMUNITY): Payer: Self-pay

## 2020-11-18 ENCOUNTER — Encounter (HOSPITAL_COMMUNITY): Payer: Self-pay

## 2020-11-18 ENCOUNTER — Ambulatory Visit (HOSPITAL_COMMUNITY): Payer: Medicare Other

## 2020-11-18 DIAGNOSIS — S81801A Unspecified open wound, right lower leg, initial encounter: Secondary | ICD-10-CM | POA: Diagnosis not present

## 2020-11-18 DIAGNOSIS — S81802A Unspecified open wound, left lower leg, initial encounter: Secondary | ICD-10-CM

## 2020-11-18 DIAGNOSIS — R262 Difficulty in walking, not elsewhere classified: Secondary | ICD-10-CM

## 2020-11-18 NOTE — Telephone Encounter (Signed)
Called MD and spoke about increased groin swelling and pt wishing to move to ALF.  She encouraged pt to schedule apt next week.  Will share contact information and importance of returning to MD wiht pt later today.    Ihor Austin, LPTA/CLT; Delana Meyer [phone]

## 2020-11-18 NOTE — Therapy (Signed)
[place], [place], [address] Phone: [phone]   Fax:  [phone]  Wound Care Therapy  Patient Details  Name: James Cervantes MRN: [id number] Date of Birth: 02-Aug-1950 No data recorded  Encounter Date: 11/18/2020   PT End of Session - 11/18/20 1505     Visit Number 4    Number of Visits 12    Date for PT Re-Evaluation 12/22/20    Authorization Type medicare primary and medicaid secondary    Progress Note Due on Visit 10    PT Start Time 1318    PT Stop Time 1440    PT Time Calculation (min) 82 min    Activity Tolerance Patient tolerated treatment well    Behavior During Therapy [facility] for tasks assessed/performed             Past Medical History:  Diagnosis Date   Anemia    Arthritis    Asthma    Cardiomyopathy ([place])    a. 2014: EF 40-45%, diffuse HK, and Grade 2 DD   Dyspnea    Emphysema    GERD (gastroesophageal reflux disease)    Hip pain    Hypertension    Peripheral vascular disease ([place])    Prostate disorder     Past Surgical History:  Procedure Laterality Date   ABDOMINAL SURGERY     CATARACT EXTRACTION W/PHACO Left 11/26/2016   Procedure: CATARACT EXTRACTION PHACO AND INTRAOCULAR LENS PLACEMENT LEFT EYE;  Surgeon: Tonny Branch, MD;  Location: [facility];  Service: Ophthalmology;  Laterality: Left;  CDE: 26.53    CATARACT EXTRACTION W/PHACO Right 01/28/2017   Procedure: CATARACT EXTRACTION PHACO AND INTRAOCULAR LENS PLACEMENT RIGHT EYE;  Surgeon: Tonny Branch, MD;  Location: [facility];  Service: Ophthalmology;  Laterality: Right;  CDE: 6.79   HERNIA REPAIR     LUNG SURGERY     MASS EXCISION Right 11/01/2017   Procedure: EXCISION CYST RIGHT KNEE;  Surgeon: Aviva Signs, MD;  Location: [facility];  Service: General;  Laterality: Right;   VASCULAR SURGERY      There were no vitals filed for this visit.    Subjective Assessment - 11/18/20 1452     Subjective Pt. stated Rt blister busted and  drained onto pants, no reports of pain.  Stated breathing is the hardest and biggest c/o.    Currently in Pain? No/denies                       Wound Therapy - 11/18/20 0001     Subjective Pt. stated Rt blister busted and drained onto pants, no reports of pain.  Stated breathing is the hardest and biggest c/o.    Patient and Family Stated Goals to have wounds heal    Date of Onset --   > 1 month   Prior Treatments none - comes with xeroform and gauze on them    Pain Scale 0-10    Pain Score 0-No pain    Faces Pain Scale No hurt    Pain Type Acute pain    Pain Location Leg    Pain Orientation Right;Left    Pain Descriptors / Indicators Burning    Pain Onset With Activity    Patients Stated Pain Goal 0    Evaluation and Treatment Procedures Explained to Patient/Family Yes    Evaluation and Treatment Procedures agreed to    Wound Properties Date First Assessed: 11/10/20 Time First Assessed: 1315 Wound  Type: Diabetic ulcer Location: Pretibial Location Orientation: Right Wound Description (Comments): lateral largest wound Present on Admission: Yes   Wound Image Images linked: 1    Dressing Type Impregnated gauze (bismuth);Alginate;Compression wrap   vaseline, xeroform, aglinate, profore lite   Dressing Changed Changed    Dressing Status Old drainage    Dressing Change Frequency PRN    Site / Wound Assessment Pink;Red    % Wound base Red or Granulating 100%    Peri-wound Assessment Intact;Maceration    Wound Length (cm) 8 cm    Wound Width (cm) 6.8 cm    Wound Surface Area (cm^2) 54.4 cm^2    Margins Attached edges (approximated)    Drainage Amount Moderate    Drainage Description Serous    Treatment Cleansed;Debridement (Selective)    Wound Properties Date First Assessed: 11/10/20 Time First Assessed: 1315 Wound Type: Diabetic ulcer Location: Pretibial Location Orientation: Right Wound Description (Comments): medial wound Present on Admission: Yes   Wound Image Images  linked: 1    Dressing Type Impregnated gauze (bismuth);Alginate;Compression wrap   vaseline, xeroform, aglinate, profore lite   Dressing Changed Changed    Dressing Status Old drainage    Dressing Change Frequency PRN    Site / Wound Assessment Red    % Wound base Red or Granulating 100%    Peri-wound Assessment Intact    Wound Length (cm) 2.6 cm    Wound Width (cm) 2.2 cm    Wound Surface Area (cm^2) 5.72 cm^2    Margins Attached edges (approximated)    Drainage Amount Scant    Drainage Description Serous    Treatment Cleansed;Debridement (Selective)    Wound Properties Date First Assessed: 11/10/20 Time First Assessed: 1315 Wound Type: Diabetic ulcer Location: Pretibial Location Orientation: Left Wound Description (Comments): wound on left leg Present on Admission: Yes   Dressing Type Impregnated gauze (bismuth);Compression wrap    Dressing Changed Changed    Dressing Status Old drainage    Dressing Change Frequency PRN    Site / Wound Assessment Red;Pink;Yellow    % Wound base Red or Granulating 85%    % Wound base Yellow/Fibrinous Exudate 15%    Peri-wound Assessment Intact    Wound Length (cm) --   misplaced this measurements, complete next session.   Margins Attached edges (approximated)    Drainage Amount Scant    Drainage Description Serous    Treatment Cleansed;Debridement (Selective)    Selective Debridement - Location B LE    Selective Debridement - Tools Used Forceps;Scalpel    Selective Debridement - Tissue Removed devitalized tissue    Wound Therapy - Clinical Statement Rt medial blister has burst (picture in media) and increased drainage onto dressings and pants.  Selective debridment for removal of devitalized tissue and macerated tissue from drainage.  Lt medial blister is still intact, did place alginate, ABD pad and kerlix as likely to burst before returns to therapy.  Continued wiht xeroform on wound bed with alginate to address the wheeping on Rt lateral wounds  and profore lite for edema control.  MD called earlier relating to c/o increased hard groin swelling and difficulty breathing.  Encouraged pt to call and make apt next week to address these issues.  Pt given contact information for PCP.  Marland Kitchen    Wound Therapy - Functional Problem List walking, bathing, dressing    Factors Delaying/Impairing Wound Healing Multiple medical problems    Wound Therapy - Frequency 2X / week    Wound Therapy -  Current Recommendations PT    Wound Plan debride as indicated, redress.  measure weekly (thursday or friday)    Dressing  B LE: xerofrom to wound beds, alginate arounds wounds, profore lite with #1/#2 reversed; cotton applied to negative area.                       PT Short Term Goals - 11/10/20 1436       PT SHORT TERM GOAL #1   Title wounds on right will be 100% granulated    Time 3    Period Weeks    Status New    Target Date 12/01/20      PT SHORT TERM GOAL #2   Title wound on left will be 100% granulated    Time 3    Period Weeks    Status New    Target Date 12/01/20      PT SHORT TERM GOAL #3   Title Patient will have < 3 wounds on right leg    Time 3    Period Weeks    Status New    Target Date 12/01/20               PT Long Term Goals - 11/10/20 1437       PT LONG TERM GOAL #1   Title wounds on right will be completely healed    Time 6    Period Weeks    Status New    Target Date 12/22/20      PT LONG TERM GOAL #2   Title wounds on left will be completely healed    Time 6    Period Weeks    Status New    Target Date 12/22/20      PT LONG TERM GOAL #3   Title Patietn will report importance of wearing compression garments daily    Time 6    Period Weeks    Status New    Target Date 12/22/20                    Patient will benefit from skilled therapeutic intervention in order to improve the following deficits and impairments:     Visit Diagnosis: Wound of left lower extremity, initial  encounter  Multiple open wounds of right lower leg  Difficulty in walking, not elsewhere classified     Problem List  Ihor Austin, LPTA/CLT; CBIS [phone]  Aldona Lento 11/18/2020, 3:06 PM  [place], [place], [address] Phone: [phone]   Fax:  [phone]  Name: James Cervantes MRN: [id number] Date of Birth: 11/28/1950

## 2020-11-18 NOTE — Telephone Encounter (Signed)
Called pt to ask if plans to attend todays session as plan for 2x/week.  Pt reports drainage into his jeans and wishes to be seen today (will be 3rd visit this week).  Pt stated he had not called PCP yet.  Pt provided PCP contact number.  Ihor Austin, LPTA/CLT; Delana Meyer [phone]

## 2020-11-21 ENCOUNTER — Other Ambulatory Visit: Payer: Self-pay

## 2020-11-21 ENCOUNTER — Encounter (HOSPITAL_COMMUNITY): Payer: Self-pay

## 2020-11-21 ENCOUNTER — Ambulatory Visit (HOSPITAL_COMMUNITY): Payer: Medicare Other

## 2020-11-21 DIAGNOSIS — S81801A Unspecified open wound, right lower leg, initial encounter: Secondary | ICD-10-CM

## 2020-11-21 DIAGNOSIS — R262 Difficulty in walking, not elsewhere classified: Secondary | ICD-10-CM

## 2020-11-21 DIAGNOSIS — S81802A Unspecified open wound, left lower leg, initial encounter: Secondary | ICD-10-CM

## 2020-11-21 NOTE — Therapy (Signed)
[place], [place], [address] Phone: [phone]   Fax:  [phone]  Wound Care Therapy  Patient Details  Name: James Cervantes MRN: [id number] Date of Birth: 1950/06/06 No data recorded  Encounter Date: 11/21/2020   PT End of Session - 11/21/20 1726     Visit Number 5    Number of Visits 12    Date for PT Re-Evaluation 12/22/20    Authorization Type medicare primary and medicaid secondary    Progress Note Due on Visit 10    PT Start Time 1312    PT Stop Time 1350    PT Time Calculation (min) 38 min    Activity Tolerance Patient tolerated treatment well    Behavior During Therapy [facility] for tasks assessed/performed             Past Medical History:  Diagnosis Date   Anemia    Arthritis    Asthma    Cardiomyopathy ([place])    a. 2014: EF 40-45%, diffuse HK, and Grade 2 DD   Dyspnea    Emphysema    GERD (gastroesophageal reflux disease)    Hip pain    Hypertension    Peripheral vascular disease ([place])    Prostate disorder     Past Surgical History:  Procedure Laterality Date   ABDOMINAL SURGERY     CATARACT EXTRACTION W/PHACO Left 11/26/2016   Procedure: CATARACT EXTRACTION PHACO AND INTRAOCULAR LENS PLACEMENT LEFT EYE;  Surgeon: Tonny Branch, MD;  Location: [facility];  Service: Ophthalmology;  Laterality: Left;  CDE: 26.53    CATARACT EXTRACTION W/PHACO Right 01/28/2017   Procedure: CATARACT EXTRACTION PHACO AND INTRAOCULAR LENS PLACEMENT RIGHT EYE;  Surgeon: Tonny Branch, MD;  Location: [facility];  Service: Ophthalmology;  Laterality: Right;  CDE: 6.79   HERNIA REPAIR     LUNG SURGERY     MASS EXCISION Right 11/01/2017   Procedure: EXCISION CYST RIGHT KNEE;  Surgeon: Aviva Signs, MD;  Location: [facility];  Service: General;  Laterality: Right;   VASCULAR SURGERY      There were no vitals filed for this visit.    Subjective Assessment - 11/21/20 1723     Subjective Pt reports he is breathing easier.  Rt  and Lt blister busted and drained onto pants.  No reports of pain.                       Wound Therapy - 11/21/20 0001     Subjective Pt reports he is breathing easier.  Rt and Lt blister busted and drained onto pants.  No reports of pain.    Patient and Family Stated Goals to have wounds heal    Date of Onset --   >month   Prior Treatments none - comes with xeroform and gauze on them    Pain Scale 0-10    Pain Score 0-No pain    Wound Therapy - Clinical Statement Lt blister busted, dressings slid down exposing bil knees.               11/22/20 0001  Subjective Assessment  Subjective Pt reports he is breathing easier.  Rt and Lt blister busted and drained onto pants.  No reports of pain.  Patient and Family Stated Goals to have wounds heal  Date of Onset  (>month)  Prior Treatments none - comes with xeroform and gauze on them  Pain Assessment  Pain Scale 0-10  Pain Score 0  Evaluation and Treatment  Evaluation and Treatment Procedures Explained to Patient/Family Yes  Evaluation and Treatment Procedures agreed to  Wound / Incision (Open or Dehisced) 11/10/20 Diabetic ulcer Pretibial Right lateral largest wound  Date First Assessed/Time First Assessed: 11/10/20 1315   Wound Type: Diabetic ulcer  Location: Pretibial  Location Orientation: Right  Wound Description (Comments): lateral largest wound  Present on Admission: Yes  Dressing Type Impregnated gauze (bismuth);Alginate;Compression wrap (xeroform. alginate, profore lite)  Dressing Changed Changed  Dressing Status Old drainage  Dressing Change Frequency PRN  Site / Wound Assessment Pink;Red  % Wound base Red or Granulating 100%  Peri-wound Assessment Intact  Margins Attached edges (approximated)  Drainage Amount Moderate (wheeping from wound bed)  Drainage Description Serous  Treatment Cleansed;Debridement (Selective)  Wound / Incision (Open or Dehisced) 11/10/20 Diabetic ulcer Pretibial Right medial  wound  Date First Assessed/Time First Assessed: 11/10/20 1315   Wound Type: Diabetic ulcer  Location: Pretibial  Location Orientation: Right  Wound Description (Comments): medial wound  Present on Admission: Yes  Dressing Type Impregnated gauze (bismuth);Alginate;Compression wrap (xeroform. alginate, profore lite)  Dressing Changed Changed  Dressing Status Old drainage  Dressing Change Frequency PRN  Site / Wound Assessment Red;Granulation tissue  % Wound base Red or Granulating 100%  Peri-wound Assessment Intact  Margins Attached edges (approximated)  Drainage Amount Scant  Drainage Description Serous  Treatment Cleansed;Debridement (Selective)  Wound / Incision (Open or Dehisced) 11/10/20 Diabetic ulcer Pretibial Left wound on left leg  Date First Assessed/Time First Assessed: 11/10/20 1315   Wound Type: Diabetic ulcer  Location: Pretibial  Location Orientation: Left  Wound Description (Comments): wound on left leg  Present on Admission: Yes  Dressing Type Impregnated gauze (bismuth);Compression wrap (xeroform, profore lite)  Dressing Changed Changed  Dressing Status Old drainage  Dressing Change Frequency PRN  Site / Wound Assessment Red;Pink;Yellow  % Wound base Red or Granulating 95%  % Wound base Yellow/Fibrinous Exudate 5%  Peri-wound Assessment Intact  Margins Attached edges (approximated)  Drainage Amount Scant  Drainage Description Serous  Treatment Cleansed;Debridement (Selective)  Selective Debridement  Selective Debridement - Location B LE  Selective Debridement - Tools Used Forceps;Scalpel  Selective Debridement - Tissue Removed devitalized tissue  Wound Therapy - Assess/Plan/Recommendations  Wound Therapy - Clinical Statement Lt blister busted with drainage noted on pant leg.  While gently washing LE, dead skin removed from blister.  Wounds improved granulation tissue especially on Lt LE, increased ease for removal of slough from wound bed.  Pt late for apt, limited  time for debridement.  Continued with xeroform and profore lite for edema control.  Used alginate to address drainage on Rt LE.  Used alginate and medipore tape to address drainage from blisters, kerlix slid down previous session.  No reports of pain through session.  Wound Therapy - Functional Problem List walking, bathing, dressing  Factors Delaying/Impairing Wound Healing Multiple medical problems  Wound Therapy - Frequency 2X / week  Wound Therapy - Current Recommendations PT  Wound Plan debride as indicated, redress.  measure weekly (thursday or friday)  Wound Therapy  Dressing  B LE: xerofrom to wound beds, alginate arounds wounds, profore lite with #1/#2 reversed; cotton applied to negative area.            PT Short Term Goals - 11/10/20 1436       PT SHORT TERM GOAL #1   Title wounds on right will be 100% granulated    Time 3  Period Weeks    Status New    Target Date 12/01/20      PT SHORT TERM GOAL #2   Title wound on left will be 100% granulated    Time 3    Period Weeks    Status New    Target Date 12/01/20      PT SHORT TERM GOAL #3   Title Patient will have < 3 wounds on right leg    Time 3    Period Weeks    Status New    Target Date 12/01/20               PT Long Term Goals - 11/10/20 1437       PT LONG TERM GOAL #1   Title wounds on right will be completely healed    Time 6    Period Weeks    Status New    Target Date 12/22/20      PT LONG TERM GOAL #2   Title wounds on left will be completely healed    Time 6    Period Weeks    Status New    Target Date 12/22/20      PT LONG TERM GOAL #3   Title Patietn will report importance of wearing compression garments daily    Time 6    Period Weeks    Status New    Target Date 12/22/20                    Patient will benefit from skilled therapeutic intervention in order to improve the following deficits and impairments:     Visit Diagnosis: Wound of left lower  extremity, initial encounter  Multiple open wounds of right lower leg  Difficulty in walking, not elsewhere classified     Problem List Patient Active Problem List   Diagnosis Date Noted   Acute on chronic systolic CHF (congestive heart failure) ([place]) 10/28/2020   Noncompliance 12/16/2017   Elevated troponin 12/16/2017   Bronchitis 12/16/2017   COPD with acute exacerbation ([place]) 12/15/2017   Lipoma of extremity    Secondary cardiomyopathy ([place]) 10/14/2012   Atypical chest pain 10/13/2012   Chest pain 10/12/2012   Abnormal EKG 10/12/2012   GERD (gastroesophageal reflux disease) 10/12/2012   UNSPECIFIED ANEMIA 05/18/2008   ERECTILE DYSFUNCTION 09/25/2007   [facility] PUL[facility] TB-CULT DX 06/19/2007   UNDERWEIGHT 06/19/2007   OSTEOARTHRITIS, HIP, RIGHT 02/18/2006   HEPATITIS C 01/08/2006   DISORDER, TOBACCO USE 01/08/2006   MIGRAINE HEADACHE 01/08/2006   ALLERGIC RHINITIS 01/08/2006   GERD 01/08/2006   BPH (benign prostatic hyperplasia) 01/08/2006   OSTEOARTHRITIS 01/08/2006   Ihor Austin, LPTA/CLT; CBIS [phone]  Aldona Lento, PTA 11/21/2020, 5:27 PM  [place] [address] [place], [place], [address] Phone: [phone]   Fax:  [phone]  Name: James Cervantes MRN: [id number] Date of Birth: Aug 26, 1950

## 2020-11-23 ENCOUNTER — Ambulatory Visit (HOSPITAL_COMMUNITY): Payer: Medicare Other | Admitting: Physical Therapy

## 2020-11-28 ENCOUNTER — Ambulatory Visit (HOSPITAL_COMMUNITY): Payer: Medicare Other

## 2020-11-28 ENCOUNTER — Encounter (HOSPITAL_COMMUNITY): Payer: Self-pay

## 2020-11-28 ENCOUNTER — Other Ambulatory Visit: Payer: Self-pay

## 2020-11-28 DIAGNOSIS — S81802A Unspecified open wound, left lower leg, initial encounter: Secondary | ICD-10-CM

## 2020-11-28 DIAGNOSIS — S81801A Unspecified open wound, right lower leg, initial encounter: Secondary | ICD-10-CM | POA: Diagnosis not present

## 2020-11-28 DIAGNOSIS — R262 Difficulty in walking, not elsewhere classified: Secondary | ICD-10-CM

## 2020-11-28 NOTE — Therapy (Signed)
[place], [place], [address] Phone: [phone]   Fax:  [phone]  Wound Care Therapy  Patient Details  Name: James Cervantes MRN: [id number] Date of Birth: 11/14/1950 No data recorded  Encounter Date: 11/28/2020   PT End of Session - 11/28/20 1611     Visit Number 6    Number of Visits 12    Date for PT Re-Evaluation 12/22/20    Authorization Type medicare primary and medicaid secondary    Progress Note Due on Visit 10    PT Start Time 1450    PT Stop Time 7858    PT Time Calculation (min) 65 min    Activity Tolerance Patient tolerated treatment well    Behavior During Therapy [facility] for tasks assessed/performed             Past Medical History:  Diagnosis Date   Anemia    Arthritis    Asthma    Cardiomyopathy ([place])    a. 2014: EF 40-45%, diffuse HK, and Grade 2 DD   Dyspnea    Emphysema    GERD (gastroesophageal reflux disease)    Hip pain    Hypertension    Peripheral vascular disease ([place])    Prostate disorder     Past Surgical History:  Procedure Laterality Date   ABDOMINAL SURGERY     CATARACT EXTRACTION W/PHACO Left 11/26/2016   Procedure: CATARACT EXTRACTION PHACO AND INTRAOCULAR LENS PLACEMENT LEFT EYE;  Surgeon: Tonny Branch, MD;  Location: [facility];  Service: Ophthalmology;  Laterality: Left;  CDE: 26.53    CATARACT EXTRACTION W/PHACO Right 01/28/2017   Procedure: CATARACT EXTRACTION PHACO AND INTRAOCULAR LENS PLACEMENT RIGHT EYE;  Surgeon: Tonny Branch, MD;  Location: [facility];  Service: Ophthalmology;  Laterality: Right;  CDE: 6.79   HERNIA REPAIR     LUNG SURGERY     MASS EXCISION Right 11/01/2017   Procedure: EXCISION CYST RIGHT KNEE;  Surgeon: Aviva Signs, MD;  Location: [facility];  Service: General;  Laterality: Right;   VASCULAR SURGERY      There were no vitals filed for this visit.    Subjective Assessment - 11/28/20 1604     Subjective Reports a change in diet that has  assisted with proper eating.  Reports he had to remove the garments over blisters and clean due to odor.  Dressings intact.    Currently in Pain? No/denies                       Wound Therapy - 11/28/20 0001     Subjective Reports a change in diet that has assisted with proper eating.  Reports he had to remove the garments over blisters and clean due to odor.  Dressings intact.    Patient and Family Stated Goals to have wounds heal    Date of Onset --   > month   Prior Treatments none - comes with xeroform and gauze on them    Pain Scale 0-10    Pain Score 0-No pain    Evaluation and Treatment Procedures Explained to Patient/Family Yes    Evaluation and Treatment Procedures agreed to    Wound Properties Date First Assessed: 11/10/20 Time First Assessed: 8502 Wound Type: Diabetic ulcer Location: Pretibial Location Orientation: Right Wound Description (Comments): lateral largest wound Present on Admission: Yes   Wound Image Images linked: 1    Dressing Type Impregnated gauze (bismuth);Alginate;Compression wrap   vaseline,  xeroform, alginate, profore lite   Dressing Changed Changed    Dressing Status Old drainage    Dressing Change Frequency PRN    Site / Wound Assessment Pink;Red;Granulation tissue    % Wound base Red or Granulating 100%   following debridement   Peri-wound Assessment Intact    Margins Attached edges (approximated)    Drainage Amount Moderate    Drainage Description Serous    Treatment Cleansed;Debridement (Selective)    Wound Properties Date First Assessed: 11/10/20 Time First Assessed: 1315 Wound Type: Diabetic ulcer Location: Pretibial Location Orientation: Right Wound Description (Comments): medial wound Present on Admission: Yes   Wound Image Images linked: 1    Dressing Type Impregnated gauze (bismuth);Alginate;Compression wrap   vaseline, xeroform, alginate, profore lite   Dressing Changed Changed    Dressing Status Old drainage    Dressing Change  Frequency PRN    Site / Wound Assessment Red;Granulation tissue    % Wound base Red or Granulating 100%    Peri-wound Assessment Intact    Margins Attached edges (approximated)    Drainage Amount Scant    Drainage Description Serous    Treatment Cleansed;Debridement (Selective)    Wound Properties Date First Assessed: 11/10/20 Time First Assessed: 1315 Wound Type: Diabetic ulcer Location: Pretibial Location Orientation: Left Wound Description (Comments): wound on left leg Present on Admission: Yes   Wound Image Images linked: 1    Dressing Type Impregnated gauze (bismuth);Alginate;Compression wrap   vaseline, xeroform, alginate, profore lite   Dressing Changed Changed    Dressing Status Old drainage    Dressing Change Frequency PRN    Site / Wound Assessment Red;Pink    % Wound base Red or Granulating 100%    % Wound base Yellow/Fibrinous Exudate 0%    Peri-wound Assessment Intact    Margins Attached edges (approximated)    Drainage Amount Scant    Drainage Description Serous    Treatment Cleansed;Debridement (Selective)    Selective Debridement - Location B LE    Selective Debridement - Tools Used Forceps;Scalpel    Selective Debridement - Tissue Removed devitalized tissue    Wound Therapy - Clinical Statement Added medihoney to Lt knee wound where blister was due to adherent slough.  Rt LE slough easier removed from wound bed and continued with xeroform and alginate to address drainage.  Continued with profore lite for edema control.  Pt presents with improved breathing this session, no reports of pain through session.    Wound Therapy - Functional Problem List walking, bathing, dressing    Factors Delaying/Impairing Wound Healing Multiple medical problems    Wound Therapy - Frequency 2X / week    Wound Therapy - Current Recommendations PT    Wound Plan debride as indicated, redress.  measure weekly (thursday or friday)    Dressing  B LE: xerofrom to wound beds, alginate arounds  wounds, profore lite with #1/#2 reversed; cotton applied to negative area.    Dressing Lt knee wound: medihoney, alginate, medipre tape                       PT Short Term Goals - 11/10/20 1436       PT SHORT TERM GOAL #1   Title wounds on right will be 100% granulated    Time 3    Period Weeks    Status New    Target Date 12/01/20      PT SHORT TERM GOAL #2   Title wound on  left will be 100% granulated    Time 3    Period Weeks    Status New    Target Date 12/01/20      PT SHORT TERM GOAL #3   Title Patient will have < 3 wounds on right leg    Time 3    Period Weeks    Status New    Target Date 12/01/20               PT Long Term Goals - 11/10/20 1437       PT LONG TERM GOAL #1   Title wounds on right will be completely healed    Time 6    Period Weeks    Status New    Target Date 12/22/20      PT LONG TERM GOAL #2   Title wounds on left will be completely healed    Time 6    Period Weeks    Status New    Target Date 12/22/20      PT LONG TERM GOAL #3   Title Patietn will report importance of wearing compression garments daily    Time 6    Period Weeks    Status New    Target Date 12/22/20                    Patient will benefit from skilled therapeutic intervention in order to improve the following deficits and impairments:     Visit Diagnosis: Wound of left lower extremity, initial encounter  Multiple open wounds of right lower leg  Difficulty in walking, not elsewhere classified     Problem List Patient Active Problem List   Diagnosis Date Noted   Acute on chronic systolic CHF (congestive heart failure) ([place]) 10/28/2020   Noncompliance 12/16/2017   Elevated troponin 12/16/2017   Bronchitis 12/16/2017   COPD with acute exacerbation ([place]) 12/15/2017   Lipoma of extremity    Secondary cardiomyopathy ([place]) 10/14/2012   Atypical chest pain 10/13/2012   Chest pain 10/12/2012   Abnormal EKG 10/12/2012   GERD  (gastroesophageal reflux disease) 10/12/2012   UNSPECIFIED ANEMIA 05/18/2008   ERECTILE DYSFUNCTION 09/25/2007   UNS PULMONARY TB-CULT DX 06/19/2007   UNDERWEIGHT 06/19/2007   OSTEOARTHRITIS, HIP, RIGHT 02/18/2006   HEPATITIS C 01/08/2006   DISORDER, TOBACCO USE 01/08/2006   MIGRAINE HEADACHE 01/08/2006   ALLERGIC RHINITIS 01/08/2006   GERD 01/08/2006   BPH (benign prostatic hyperplasia) 01/08/2006   OSTEOARTHRITIS 01/08/2006   James Cervantes, LPTA/CLT; CBIS [phone]  Aldona Lento, PTA 11/28/2020, 4:12 PM  [facility] [address] [place], [place], [address] Phone: [phone]   Fax:  [phone]  Name: James Cervantes MRN: [id number] Date of Birth: [birth year]

## 2020-11-30 ENCOUNTER — Ambulatory Visit (HOSPITAL_COMMUNITY): Payer: Medicare Other | Admitting: Physical Therapy

## 2020-11-30 ENCOUNTER — Telehealth (HOSPITAL_COMMUNITY): Payer: Self-pay | Admitting: Physical Therapy

## 2020-11-30 NOTE — Telephone Encounter (Signed)
Pt did not show for appointment.  Called and spoke to pt who states he was unaware and no one called him to remind him of this appt . Informed we have an automated phone tree but we cannot call each individual patient each visit.  Educated to refer back to his written schedule given to him here at clinic.  Reminded of next appt on Friday at 9 am.    Teena Irani, PTA/CLT, Lissa [facility]ales [phone]

## 2020-12-01 ENCOUNTER — Ambulatory Visit: Payer: Medicare Other | Admitting: Cardiology

## 2020-12-01 NOTE — Progress Notes (Signed)
Cardiology Office Note    Date:  12/03/2020   ID:  James Cervantes, DOB 20-Nov-1950, MRN [id number]  PCP:  Celene Squibb, MD  Cardiologist: Rozann Lesches, MD    Chief Complaint  Patient presents with   Follow-up    6 month visit    History of Present Illness:    James Cervantes is a 70 y.o. male with past medical history of cardiomyopathy (EF 40-45% by echo in 2014, EF 20 to 25% in 04/2017 and 05/2020), HTN, COPD, prior partial lung resection, medication noncompliance and tobacco use who presents to the office today for 11-month follow-up.  He was last examined by Ermalinda Barrios, PA-C in 06/2020 and he was unsure which medications he was taking at that time as he could not read his prescription bottles.  She was working with social work to see if Paramedicine help could be arranged. He was continued on ASA, Digoxin 0.125 mg daily, Lasix 20 mg daily, Losartan 25 mg daily, Toprol-XL 25 mg daily and Spironolactone 25 mg daily.  He was admitted to [facility] in 10/2020 for an acute CHF exacerbation and was admitted and started on IV Lasix but left AMA the following day.  In talking with the patient today, he reports having baseline dyspnea on exertion for the past several months and notices this with minimal activity. Denies any associated chest pain or palpitations. No specific orthopnea or PND. He has experienced lower extremity edema with wounds along his legs bilaterally and is currently being followed by the wound clinic. His main complaint today is generalized abdominal discomfort. Reports regular bowel movements but experiences intermittent abdominal pain. He did switch to a different type of breakfast cereal and says this has helped.   He is unsure which medications he currently takes and is illiterate. Goes by the color of the medications and says he takes 5-6 pills a day. He previously declined Paramedicine services by review of notes.   Past Medical History:  Diagnosis Date   Anemia     Arthritis    Asthma    Cardiomyopathy ([place])    a. 2014: EF 40-45%, diffuse HK, and Grade 2 DD b. EF at 20-25% by echo in 04/2017 and 05/2020   Dyspnea    Emphysema    GERD (gastroesophageal reflux disease)    Hip pain    Hypertension    Peripheral vascular disease ([place])    Prostate disorder     Past Surgical History:  Procedure Laterality Date   ABDOMINAL SURGERY     CATARACT EXTRACTION W/PHACO Left 11/26/2016   Procedure: CATARACT EXTRACTION PHACO AND INTRAOCULAR LENS PLACEMENT LEFT EYE;  Surgeon: Tonny Branch, MD;  Location: [facility];  Service: Ophthalmology;  Laterality: Left;  CDE: 26.53    CATARACT EXTRACTION W/PHACO Right 01/28/2017   Procedure: CATARACT EXTRACTION PHACO AND INTRAOCULAR LENS PLACEMENT RIGHT EYE;  Surgeon: Tonny Branch, MD;  Location: [facility];  Service: Ophthalmology;  Laterality: Right;  CDE: 6.79   HERNIA REPAIR     LUNG SURGERY     MASS EXCISION Right 11/01/2017   Procedure: EXCISION CYST RIGHT KNEE;  Surgeon: Aviva Signs, MD;  Location: [facility];  Service: General;  Laterality: Right;   VASCULAR SURGERY      Current Medications: Outpatient Medications Prior to Visit  Medication Sig Dispense Refill   albuterol (PROAIR HFA) 108 (90 Base) MCG/ACT inhaler Inhale 2 puffs into the lungs every 4 (four) hours as needed for wheezing or shortness of breath.  8 g 1   ANORO ELLIPTA 62.5-25 MCG/INH AEPB Inhale 1 puff into the lungs daily.     losartan (COZAAR) 25 MG tablet Take 1 tablet (25 mg total) by mouth daily. (Patient taking differently: Take 12.5 mg by mouth daily.) 90 tablet 3   oxyCODONE (OXY IR/ROXICODONE) 5 MG immediate release tablet Take 5 mg by mouth at bedtime.      pantoprazole (PROTONIX) 40 MG tablet Take 40 mg by mouth daily.     sildenafil (VIAGRA) 100 MG tablet Take 1 tablet (100 mg total) by mouth daily as needed. 10 tablet 0   aspirin 81 MG chewable tablet Chew 81 mg by mouth daily. (Patient not taking: Reported on 10/28/2020)     cholecalciferol  (VITAMIN D) 25 MCG (1000 UNIT) tablet Take 1,000 Units by mouth daily. (Patient not taking: Reported on 10/28/2020)     digoxin (LANOXIN) 0.125 MG tablet Take 125 mcg by mouth daily. (Patient not taking: Reported on 10/28/2020)     furosemide (LASIX) 20 MG tablet Take 20 mg by mouth daily. (Patient not taking: Reported on 10/28/2020)     metoprolol succinate (TOPROL-XL) 25 MG 24 hr tablet Take 12.5 mg by mouth daily.     spironolactone (ALDACTONE) 25 MG tablet Take 1 tablet by mouth daily. (Patient not taking: Reported on 10/28/2020)     TRELEGY ELLIPTA 100-62.5-25 MCG/INH AEPB Inhale 1 puff into the lungs daily. (Patient not taking: Reported on 10/28/2020)     No facility-administered medications prior to visit.     Allergies:   Patient has no known allergies.   Social History   Socioeconomic History   Marital status: Legally Separated    Spouse name: Not on file   Number of children: Not on file   Years of education: Not on file   Highest education level: Not on file  Occupational History   Not on file  Tobacco Use   Smoking status: Every Day    Packs/day: 0.50    Years: 50.00    Pack years: 25.00    Types: Cigarettes   Smokeless tobacco: Never  Vaping Use   Vaping Use: Never used  Substance and Sexual Activity   Alcohol use: No   Drug use: No   Sexual activity: Yes    Birth control/protection: Condom  Other Topics Concern   Not on file  Social History Narrative   Not on file   Social Determinants of Health   Financial Resource Strain: Not on file  Food Insecurity: Not on file  Transportation Needs: Not on file  Physical Activity: Not on file  Stress: Not on file  Social Connections: Not on file     Family History:  The patient's family history includes CVA in his brother; Cancer in his mother; Hypertension in his mother.   Review of Systems:    Please see the history of present illness.     All other systems reviewed and are otherwise negative except as  noted above.   Physical Exam:    VS:  BP 112/70   Pulse 88   Ht 5\' 6"  (1.676 m)   Wt 135 lb (61.2 kg)   BMI 21.79 kg/m    General: Well developed, well nourished,male appearing in no acute distress. Head: Normocephalic, atraumatic. Neck: No carotid bruits. JVD not elevated.  Lungs: Respirations regular and unlabored, without wheezes or rales.  Heart: Regular rate and rhythm. No S3 or S4.  No murmur, no rubs, or gallops appreciated. Abdomen: Appears  mildly distended. No obvious abdominal masses. Msk:  Strength and tone appear normal for age. No obvious joint deformities or effusions. Extremities: No clubbing or cyanosis. 1+ pitting edema. Legs wrapped.  Neuro: Alert and oriented X 3. Moves all extremities spontaneously. No focal deficits noted. Psych:  Responds to questions appropriately with a normal affect. Skin: No rashes or lesions noted  Wt Readings from Last 3 Encounters:  12/02/20 135 lb (61.2 kg)  10/29/20 139 lb 15.9 oz (63.5 kg)  10/15/20 140 lb (63.5 kg)    Studies/Labs Reviewed:   EKG:  EKG is not ordered today.   Recent Labs: 10/28/2020: B Natriuretic Peptide 3,017.0; Hemoglobin 11.5; Platelets 283 12/02/2020: ALT 15; BUN 18; Creatinine, Ser 1.10; Potassium 4.3; Sodium 137   Lipid Panel    Component Value Date/Time   CHOL 118 12/16/2017 0504   TRIG 31 12/16/2017 0504   HDL 71 12/16/2017 0504   CHOLHDL 1.7 12/16/2017 0504   VLDL 6 12/16/2017 0504   LDLCALC 41 12/16/2017 0504    Additional studies/ records that were reviewed today include:   Echocardiogram: 05/2020 IMPRESSIONS     1. Left ventricular ejection fraction, by estimation, is 20 to 25%. The  left ventricle has severely decreased function. The left ventricle  demonstrates global hypokinesis. The left ventricular internal cavity size  was mildly dilated. There is mild left  ventricular hypertrophy. Left ventricular diastolic parameters are  consistent with Grade II diastolic dysfunction  (pseudonormalization).   2. Right ventricular systolic function is moderately reduced. The right  ventricular size is normal. There is moderately elevated pulmonary artery  systolic pressure. The estimated right ventricular systolic pressure is  62.9 mmHg.   3. Left atrial size was severely dilated.   4. There is a trivial pericardial effusion posterior to the left  ventricle.   5. The mitral valve is grossly normal. Mild mitral valve regurgitation.   6. The aortic valve is tricuspid. Aortic valve regurgitation is mild.  Aortic regurgitation PHT measures 425 msec.   7. The inferior vena cava is normal in size with greater than 50%  respiratory variability, suggesting right atrial pressure of 3 mmHg.   Assessment:    1. Secondary cardiomyopathy ([place])   2. Essential hypertension   3. Generalized abdominal pain   4. Medication management   5. Tobacco use      Plan:   In order of problems listed above:  1. HFrEF - He has a known cardiomyopathy with EF at 20 to 25% in 04/2017 and 05/2020. He does report worsening dyspnea on exertion and abdominal distension. At this time, he is not sure which medications he currently takes. Listed as being on Digoxin, Lasix, Losartan, Toprol-XL and Spironolactone but it sounds like he might only take 1-2 of these. Will recheck CMET and Digoxin level as his creatinine was elevated last month. Ideally, could switch Losartan to [facility] but would be hesitant to do so out of concern he might not stop Losartan. He previously declined Paramedicine services and declined pill-packs from his pharmacy. I have asked our nursing staff to reach out to his pharmacy to see if pill-packs can be arranged and this was reviewed with the patient today. I also asked him to bring his medication bottles to his next visit. Overall, his prognosis remains very poor given his noncompliance and continued decline of services to help with medication management.   Addendum: Nursing staff  did contact his pharmacy and his Lasix was increased to 40mg  daily by his  PCP but he refused to pick thus up given the pill looked different. Has not filled Losartan since 05/2020 but does fill K-dur and Toprol-XL. Would not make any changes until pill-packs are in place for compliance.    2. HTN - His BP is at 112/70 during today's visit. Will try to verify his regimen as outlined above.   3. Abdominal Pain - He does report intermittent abdominal pain. Denies any constipation, hematochezia or melena. Does appear distended on examination today and suspect this is secondary to fluid in the setting of CHF. Will check LFT's and order an Abdominal US.   4. Tobacco Use - He reports smoking approximately 1-2 cigarettes per day. Cessation advised.     Medication Adjustments/Labs and Tests Ordered: Current medicines are reviewed at length with the patient today.  Concerns regarding medicines are outlined above.  Medication changes, Labs and Tests ordered today are listed in the Patient Instructions below. Patient Instructions  Medication Instructions:  Your physician recommends that you continue on your current medications as directed. Please refer to the Current Medication list given to you today.  *If you need a refill on your cardiac medications before your next appointment, please call your pharmacy*   Lab Work: Your physician recommends that you return for lab work in: Today ( Digoxin level, CMET)   If you have labs (blood work) drawn today and your tests are completely normal, you will receive your results only by: [place] (if you have [place]) OR A paper copy in the mail If you have any lab test that is abnormal or we need to change your treatment, we will call you to review the results.   Testing/Procedures:  US of the abdomen    Follow-Up: At [facility], you and your health needs are our priority.  As part of our continuing mission to provide you with exceptional  heart care, we have created designated Provider Care Teams.  These Care Teams include your primary Cardiologist (physician) and Advanced Practice Providers (APPs -  Physician Assistants and Nurse Practitioners) who all work together to provide you with the care you need, when you need it.  We recommend signing up for the patient portal called "MyChart".  Sign up information is provided on this After Visit Summary.  MyChart is used to connect with patients for Virtual Visits (Telemedicine).  Patients are able to view lab/test results, encounter notes, upcoming appointments, etc.  Non-urgent messages can be sent to your provider as well.   To learn more about what you can do with MyChart, go to [email or web address].    Your next appointment:   3 month(s)  The format for your next appointment:   In Person  Provider:   Rozann Lesches, MD or Bernerd Pho, PA-C    Other Instructions Thank you for choosing [facility]!     Signed, Erma Heritage, PA-C  12/03/2020 10:01 AM    [facility] [address] [place], [place] [address] Phone: [phone] Fax: [phone]

## 2020-12-02 ENCOUNTER — Other Ambulatory Visit (HOSPITAL_COMMUNITY)
Admission: RE | Admit: 2020-12-02 | Discharge: 2020-12-02 | Disposition: A | Discharge status: Home / Self Care | Payer: Medicare Other | Source: Ambulatory Visit | Attending: Student | Admitting: Student

## 2020-12-02 ENCOUNTER — Ambulatory Visit (INDEPENDENT_AMBULATORY_CARE_PROVIDER_SITE_OTHER): Payer: Medicare Other | Admitting: Student

## 2020-12-02 ENCOUNTER — Ambulatory Visit (HOSPITAL_COMMUNITY): Payer: Medicare Other | Attending: Family Medicine | Admitting: Physical Therapy

## 2020-12-02 ENCOUNTER — Other Ambulatory Visit: Payer: Self-pay

## 2020-12-02 ENCOUNTER — Telehealth (HOSPITAL_COMMUNITY): Payer: Self-pay | Admitting: Physical Therapy

## 2020-12-02 ENCOUNTER — Encounter: Payer: Self-pay | Admitting: Student

## 2020-12-02 VITALS — BP 112/70 | HR 88 | Ht 66.0 in | Wt 135.0 lb

## 2020-12-02 DIAGNOSIS — I429 Cardiomyopathy, unspecified: Secondary | ICD-10-CM

## 2020-12-02 DIAGNOSIS — I10 Essential (primary) hypertension: Secondary | ICD-10-CM

## 2020-12-02 DIAGNOSIS — R1084 Generalized abdominal pain: Secondary | ICD-10-CM

## 2020-12-02 DIAGNOSIS — Z79899 Other long term (current) drug therapy: Secondary | ICD-10-CM | POA: Diagnosis present

## 2020-12-02 DIAGNOSIS — S81802A Unspecified open wound, left lower leg, initial encounter: Secondary | ICD-10-CM | POA: Insufficient documentation

## 2020-12-02 DIAGNOSIS — Z720 Tobacco use: Secondary | ICD-10-CM

## 2020-12-02 DIAGNOSIS — R262 Difficulty in walking, not elsewhere classified: Secondary | ICD-10-CM | POA: Insufficient documentation

## 2020-12-02 DIAGNOSIS — S81801A Unspecified open wound, right lower leg, initial encounter: Secondary | ICD-10-CM | POA: Insufficient documentation

## 2020-12-02 LAB — COMPREHENSIVE METABOLIC PANEL
ALT: 15 U/L (ref 0–44)
AST: 23 U/L (ref 15–41)
Albumin: 3.2 g/dL — ABNORMAL LOW (ref 3.5–5.0)
Alkaline Phosphatase: 83 U/L (ref 38–126)
Anion gap: 7 (ref 5–15)
BUN: 18 mg/dL (ref 8–23)
CO2: 32 mmol/L (ref 22–32)
Calcium: 8.4 mg/dL — ABNORMAL LOW (ref 8.9–10.3)
Chloride: 98 mmol/L (ref 98–111)
Creatinine, Ser: 1.1 mg/dL (ref 0.61–1.24)
GFR, Estimated: >60 mL/min (ref >60)
Glucose, Bld: 57 mg/dL — ABNORMAL LOW (ref 70–99)
Potassium: 4.3 mmol/L (ref 3.5–5.1)
Sodium: 137 mmol/L (ref 135–145)
Total Bilirubin: 0.6 mg/dL (ref 0.3–1.2)
Total Protein: 7.2 g/dL (ref 6.5–8.1)

## 2020-12-02 LAB — DIGOXIN LEVEL: Digoxin Level: <0.2 ng/mL — ABNORMAL LOW (ref 0.8–2.0)

## 2020-12-02 NOTE — Telephone Encounter (Signed)
No Show. Called and spoke with patient who reported he just woke up. Wanted to be seen later today but no current appointments available and reminded patient of heart apt that he has scheduled this afternoon at hospital. Patient was unaware of heart appointment but confirmed he would be there. No follow up apts scheduled at this time, patient placed on wait list for wound care.   10:29 AM, 12/02/20 Jerene Pitch, DPT Physical Therapy with [facility]  [phone] office

## 2020-12-02 NOTE — Patient Instructions (Signed)
Medication Instructions:  Your physician recommends that you continue on your current medications as directed. Please refer to the Current Medication list given to you today.  *If you need a refill on your cardiac medications before your next appointment, please call your pharmacy*   Lab Work: Your physician recommends that you return for lab work in: Today ( Digoxin level, CMET)   If you have labs (blood work) drawn today and your tests are completely normal, you will receive your results only by: [place] (if you have [place]) OR A paper copy in the mail If you have any lab test that is abnormal or we need to change your treatment, we will call you to review the results.   Testing/Procedures:  US of the abdomen    Follow-Up: At [facility], you and your health needs are our priority.  As part of our continuing mission to provide you with exceptional heart care, we have created designated Provider Care Teams.  These Care Teams include your primary Cardiologist (physician) and Advanced Practice Providers (APPs -  Physician Assistants and Nurse Practitioners) who all work together to provide you with the care you need, when you need it.  We recommend signing up for the patient portal called "MyChart".  Sign up information is provided on this After Visit Summary.  MyChart is used to connect with patients for Virtual Visits (Telemedicine).  Patients are able to view lab/test results, encounter notes, upcoming appointments, etc.  Non-urgent messages can be sent to your provider as well.   To learn more about what you can do with MyChart, go to [email or web address].    Your next appointment:   3 month(s)  The format for your next appointment:   In Person  Provider:   Rozann Lesches, MD or Bernerd Pho, PA-C    Other Instructions Thank you for choosing [facility]!

## 2020-12-03 ENCOUNTER — Encounter: Payer: Self-pay | Admitting: Student

## 2020-12-05 ENCOUNTER — Ambulatory Visit (HOSPITAL_COMMUNITY): Payer: Medicare Other | Admitting: Physical Therapy

## 2020-12-05 ENCOUNTER — Ambulatory Visit: Payer: Medicare Other

## 2020-12-05 ENCOUNTER — Other Ambulatory Visit: Payer: Self-pay

## 2020-12-05 DIAGNOSIS — S81801A Unspecified open wound, right lower leg, initial encounter: Secondary | ICD-10-CM

## 2020-12-05 DIAGNOSIS — R262 Difficulty in walking, not elsewhere classified: Secondary | ICD-10-CM | POA: Diagnosis present

## 2020-12-05 DIAGNOSIS — S81802A Unspecified open wound, left lower leg, initial encounter: Secondary | ICD-10-CM

## 2020-12-05 NOTE — Therapy (Signed)
[place], [place], [address] Phone: [phone]   Fax:  [phone]  Wound Care Therapy  Patient Details  Name: James Cervantes MRN: [id number] Date of Birth: [birth year] No data recorded  Encounter Date: 12/05/2020   PT End of Session - 12/05/20 1231     Visit Number 7    Number of Visits 12    Date for PT Re-Evaluation 12/22/20    Authorization Type medicare primary and medicaid secondary    Progress Note Due on Visit 10    PT Start Time 0945    PT Stop Time 2458    PT Time Calculation (min) 57 min    Activity Tolerance Patient tolerated treatment well    Behavior During Therapy [facility] for tasks assessed/performed             Past Medical History:  Diagnosis Date   Anemia    Arthritis    Asthma    Cardiomyopathy ([place])    a. 2014: EF 40-45%, diffuse HK, and Grade 2 DD b. EF at 20-25% by echo in 04/2017 and 05/2020   Dyspnea    Emphysema    GERD (gastroesophageal reflux disease)    Hip pain    Hypertension    Peripheral vascular disease ([place])    Prostate disorder     Past Surgical History:  Procedure Laterality Date   ABDOMINAL SURGERY     CATARACT EXTRACTION W/PHACO Left 11/26/2016   Procedure: CATARACT EXTRACTION PHACO AND INTRAOCULAR LENS PLACEMENT LEFT EYE;  Surgeon: Tonny Branch, MD;  Location: [facility];  Service: Ophthalmology;  Laterality: Left;  CDE: 26.53    CATARACT EXTRACTION W/PHACO Right 01/28/2017   Procedure: CATARACT EXTRACTION PHACO AND INTRAOCULAR LENS PLACEMENT RIGHT EYE;  Surgeon: Tonny Branch, MD;  Location: [facility];  Service: Ophthalmology;  Laterality: Right;  CDE: 6.79   HERNIA REPAIR     LUNG SURGERY     MASS EXCISION Right 11/01/2017   Procedure: EXCISION CYST RIGHT KNEE;  Surgeon: Aviva Signs, MD;  Location: [facility];  Service: General;  Laterality: Right;   VASCULAR SURGERY      There were no vitals filed for this visit.               Wound Therapy - 12/05/20  1226     Subjective Pt was told to come to appt and arrived 30 minutes late.  STates his bandages are smelly and would like them changed.  sTates he had blood work last week and wating for results    Patient and Family Stated Goals to have wounds heal    Date of Onset --   > month   Prior Treatments none - comes with xeroform and gauze on them    Pain Scale 0-10    Pain Score 0-No pain    Evaluation and Treatment Procedures Explained to Patient/Family Yes    Evaluation and Treatment Procedures agreed to    Wound Properties Date First Assessed: 11/10/20 Time First Assessed: 1315 Wound Type: Diabetic ulcer Location: Pretibial Location Orientation: Right Wound Description (Comments): lateral largest wound Present on Admission: Yes   Dressing Type Impregnated gauze (bismuth);Compression wrap    Dressing Changed Changed    Dressing Status Old drainage    Dressing Change Frequency PRN    Site / Wound Assessment Red;Pink;Yellow    % Wound base Red or Granulating 100%    Peri-wound Assessment Intact    Margins Attached edges (approximated)  Drainage Amount Moderate    Drainage Description Serous    Wound Properties Date First Assessed: 11/10/20 Time First Assessed: 9163 Wound Type: Diabetic ulcer Location: Pretibial Location Orientation: Right Wound Description (Comments): medial wound Present on Admission: Yes   Dressing Type Impregnated gauze (bismuth);Compression wrap    Dressing Changed Changed    Dressing Status Old drainage    Dressing Change Frequency PRN    Site / Wound Assessment Red;Granulation tissue    % Wound base Red or Granulating 100%    Peri-wound Assessment Intact    Margins Attached edges (approximated)    Drainage Amount Scant    Drainage Description Serous    Treatment Cleansed;Debridement (Selective)    Wound Properties Date First Assessed: 11/10/20 Time First Assessed: 1315 Wound Type: Diabetic ulcer Location: Pretibial Location Orientation: Left Wound Description  (Comments): wound on left leg Present on Admission: Yes   Dressing Type Impregnated gauze (bismuth);Compression wrap    Dressing Changed Changed    Dressing Status Old drainage    Dressing Change Frequency PRN    Site / Wound Assessment Red;Pink    % Wound base Red or Granulating 100%    % Wound base Yellow/Fibrinous Exudate 0%    Peri-wound Assessment Intact    Margins Attached edges (approximated)    Drainage Amount Scant    Drainage Description Serous    Treatment Cleansed;Debridement (Selective)    Selective Debridement - Location B LE    Selective Debridement - Tools Used Forceps;Scalpel    Selective Debridement - Tissue Removed devitalized tissue    Wound Therapy - Clinical Statement wounds continue to improve and approximate.  Debrided majority of devitalized tissue from wounds, cleansed and moisturized.  Applied profore to bil LE's and continued with xeroform.    Wound Therapy - Functional Problem List walking, bathing, dressing    Factors Delaying/Impairing Wound Healing Multiple medical problems    Wound Therapy - Frequency 2X / week    Wound Therapy - Current Recommendations PT    Wound Plan debride as indicated, redress.  measure weekly (thursday or friday)    Dressing  B LE: xeroform to wound beds, profore lite with #1/#2 reversed; cotton applied to negative area.                       PT Short Term Goals - 11/10/20 1436       PT SHORT TERM GOAL #1   Title wounds on right will be 100% granulated    Time 3    Period Weeks    Status New    Target Date 12/01/20      PT SHORT TERM GOAL #2   Title wound on left will be 100% granulated    Time 3    Period Weeks    Status New    Target Date 12/01/20      PT SHORT TERM GOAL #3   Title Patient will have < 3 wounds on right leg    Time 3    Period Weeks    Status New    Target Date 12/01/20               PT Long Term Goals - 11/10/20 1437       PT LONG TERM GOAL #1   Title wounds on right  will be completely healed    Time 6    Period Weeks    Status New    Target Date 12/22/20  PT LONG TERM GOAL #2   Title wounds on left will be completely healed    Time 6    Period Weeks    Status New    Target Date 12/22/20      PT LONG TERM GOAL #3   Title Patietn will report importance of wearing compression garments daily    Time 6    Period Weeks    Status New    Target Date 12/22/20                    Patient will benefit from skilled therapeutic intervention in order to improve the following deficits and impairments:     Visit Diagnosis: Wound of left lower extremity, initial encounter  Multiple open wounds of right lower leg  Difficulty in walking, not elsewhere classified     Problem List Patient Active Problem List   Diagnosis Date Noted   Acute on chronic systolic CHF (congestive heart failure) ([place]) 10/28/2020   Noncompliance 12/16/2017   Elevated troponin 12/16/2017   Bronchitis 12/16/2017   COPD with acute exacerbation ([place]) 12/15/2017   Lipoma of extremity    Secondary cardiomyopathy ([place]) 10/14/2012   Atypical chest pain 10/13/2012   Chest pain 10/12/2012   Abnormal EKG 10/12/2012   GERD (gastroesophageal reflux disease) 10/12/2012   UNSPECIFIED ANEMIA 05/18/2008   ERECTILE DYSFUNCTION 09/25/2007   UNS PULMONARY TB-CULT DX 06/19/2007   UNDERWEIGHT 06/19/2007   OSTEOARTHRITIS, HIP, RIGHT 02/18/2006   HEPATITIS C 01/08/2006   DISORDER, TOBACCO USE 01/08/2006   MIGRAINE HEADACHE 01/08/2006   ALLERGIC RHINITIS 01/08/2006   GERD 01/08/2006   BPH (benign prostatic hyperplasia) 01/08/2006   OSTEOARTHRITIS 01/08/2006   Kenya Shiraishi Sula Soda, PTA/CLT, WTA [phone]  Teena Irani, PTA 12/05/2020, 12:33 PM  [facility] [address] [place], [place], [address] Phone: [phone]   Fax:  [phone]  Name: James Cervantes MRN: [id number] Date of Birth: 10/11/1950

## 2020-12-06 ENCOUNTER — Ambulatory Visit (INDEPENDENT_AMBULATORY_CARE_PROVIDER_SITE_OTHER): Payer: Medicare Other | Admitting: *Deleted

## 2020-12-06 ENCOUNTER — Other Ambulatory Visit: Payer: Self-pay | Admitting: Student

## 2020-12-06 DIAGNOSIS — Z79899 Other long term (current) drug therapy: Secondary | ICD-10-CM

## 2020-12-06 MED ORDER — LOSARTAN POTASSIUM 25 MG PO TABS: 25.0000 mg | ORAL_TABLET | Freq: Every day | ORAL | 3 refills | Status: AC

## 2020-12-06 NOTE — Addendum Note (Signed)
Addended by: Levonne Hubert on: 12/06/2020 04:24 PM   Modules accepted: Orders

## 2020-12-06 NOTE — Progress Notes (Signed)
Patient in office today to review meds he is taking. Pt states that he is not taking digoxin. Pt is taking Toprol XL, Protonix, potassium and spironolactone.

## 2020-12-08 ENCOUNTER — Other Ambulatory Visit: Payer: Self-pay

## 2020-12-08 ENCOUNTER — Ambulatory Visit (HOSPITAL_COMMUNITY): Payer: Medicare Other | Admitting: Physical Therapy

## 2020-12-08 DIAGNOSIS — S81802A Unspecified open wound, left lower leg, initial encounter: Secondary | ICD-10-CM

## 2020-12-08 DIAGNOSIS — S81801A Unspecified open wound, right lower leg, initial encounter: Secondary | ICD-10-CM

## 2020-12-08 DIAGNOSIS — R262 Difficulty in walking, not elsewhere classified: Secondary | ICD-10-CM

## 2020-12-08 NOTE — Therapy (Signed)
James Cervantes, [place], [address] Phone: [phone]   Fax:  [phone]  Wound Care Therapy  Patient Details  Name: James Cervantes MRN: [id number] Date of Birth: 10-18-1950 No data recorded  Encounter Date: 12/08/2020   PT End of Session - 12/08/20 1640     Visit Number 8    Number of Visits 12    Date for PT Re-Evaluation 12/22/20    Authorization Type medicare primary and medicaid secondary    Progress Note Due on Visit 10    PT Start Time 7616    PT Stop Time 1625    PT Time Calculation (min) 50 min    Activity Tolerance Patient tolerated treatment well    Behavior During Therapy [facility] for tasks assessed/performed             Past Medical History:  Diagnosis Date   Anemia    Arthritis    Asthma    Cardiomyopathy ([place])    a. 2014: EF 40-45%, diffuse HK, and Grade 2 DD b. EF at 20-25% by echo in 04/2017 and 05/2020   Dyspnea    Emphysema    GERD (gastroesophageal reflux disease)    Hip pain    Hypertension    Peripheral vascular disease ([place])    Prostate disorder     Past Surgical History:  Procedure Laterality Date   ABDOMINAL SURGERY     CATARACT EXTRACTION W/PHACO Left 11/26/2016   Procedure: CATARACT EXTRACTION PHACO AND INTRAOCULAR LENS PLACEMENT LEFT EYE;  Surgeon: Tonny Branch, MD;  Location: [facility];  Service: Ophthalmology;  Laterality: Left;  CDE: 26.53    CATARACT EXTRACTION W/PHACO Right 01/28/2017   Procedure: CATARACT EXTRACTION PHACO AND INTRAOCULAR LENS PLACEMENT RIGHT EYE;  Surgeon: Tonny Branch, MD;  Location: [facility];  Service: Ophthalmology;  Laterality: Right;  CDE: 6.79   HERNIA REPAIR     LUNG SURGERY     MASS EXCISION Right 11/01/2017   Procedure: EXCISION CYST RIGHT KNEE;  Surgeon: Aviva Signs, MD;  Location: [facility];  Service: General;  Laterality: Right;   VASCULAR SURGERY      There were no vitals filed for this visit.               Wound Therapy - 12/08/20  1628     Subjective Pt showed up without appt but able to work into schedule.  Pt states he is having a procedure done tomorrow to look down his throat.  No pain in his LE.  Stockings are ordered and should be here next week (ordered by AF PTA)    Patient and Family Stated Goals to have wounds heal    Date of Onset --   > month   Prior Treatments none - comes with xeroform and gauze on them    Pain Scale 0-10    Pain Score 0-No pain    Evaluation and Treatment Procedures Explained to Patient/Family Yes    Evaluation and Treatment Procedures agreed to    Wound Properties Date First Assessed: 12/08/20 Time First Assessed: 0737 Wound Type: Venous stasis ulcer Location: Knee Location Orientation: Anterior;Right Wound Description (Comments): Rt medial knee blister Present on Admission: No   Wound Image Images linked: 1    Dressing Type Impregnated gauze (bismuth)    Dressing Changed Changed    Dressing Status Old drainage    Dressing Change Frequency PRN    Site / Wound Assessment Yellow;Red;Pink    %  Wound base Red or Granulating 50%    % Wound base Yellow/Fibrinous Exudate 50%    Peri-wound Assessment Intact    Wound Length (cm) 2.3 cm    Wound Width (cm) 3.6 cm    Wound Surface Area (cm^2) 8.28 cm^2    Drainage Amount Moderate    Drainage Description Serosanguineous    Treatment Cleansed;Debridement (Selective)    Wound Properties Date First Assessed: 12/08/20 Time First Assessed: 1637 Wound Type: Venous stasis ulcer Location: Knee Location Orientation: Anterior;Left Present on Admission: No   Wound Image Images linked: 1    Dressing Type Impregnated gauze (bismuth)    Dressing Changed Changed    Dressing Status Old drainage    Dressing Change Frequency PRN    Site / Wound Assessment Yellow;Red;Pink    % Wound base Red or Granulating 20%    % Wound base Yellow/Fibrinous Exudate 80%    Peri-wound Assessment Intact    Wound Length (cm) 3.5 cm    Wound Width (cm) 1.8 cm    Wound  Surface Area (cm^2) 6.3 cm^2    Drainage Amount Moderate    Drainage Description Serosanguineous    Treatment Cleansed;Debridement (Selective)    Wound Properties Date First Assessed: 11/10/20 Time First Assessed: 1315 Wound Type: Diabetic ulcer Location: Pretibial Location Orientation: Right Wound Description (Comments): lateral largest wound Present on Admission: Yes   Wound Image Images linked: 1    Dressing Type Impregnated gauze (bismuth)    Dressing Changed Changed    Dressing Status Old drainage    Dressing Change Frequency PRN    Site / Wound Assessment Red;Pink    % Wound base Red or Granulating 100%    Peri-wound Assessment Intact    Wound Width (cm) 3.3 cm    Margins Attached edges (approximated)    Drainage Amount Minimal    Drainage Description Serous    Treatment Cleansed;Debridement (Selective)    Wound Properties Date First Assessed: 11/10/20 Time First Assessed: 1315 Wound Type: Diabetic ulcer Location: Pretibial Location Orientation: Right Wound Description (Comments): medial wound Present on Admission: Yes   Wound Image Images linked: 1    Dressing Type Impregnated gauze (bismuth)    Dressing Changed Changed    Dressing Status Old drainage    Dressing Change Frequency PRN    Site / Wound Assessment Red    % Wound base Red or Granulating 100%    Peri-wound Assessment Intact    Margins Attached edges (approximated)    Drainage Amount Scant    Drainage Description Serous    Treatment Cleansed;Debridement (Selective)    Wound Properties Date First Assessed: 11/10/20 Time First Assessed: 1315 Wound Type: Diabetic ulcer Location: Pretibial Location Orientation: Left Wound Description (Comments): wound on left leg Present on Admission: Yes   Wound Image Images linked: 1    Dressing Type Impregnated gauze (bismuth)    Dressing Changed Changed    Dressing Status Old drainage    Dressing Change Frequency PRN    Site / Wound Assessment Pink;Red    % Wound base Red or  Granulating 100%    % Wound base Yellow/Fibrinous Exudate 0%    Peri-wound Assessment Intact    Margins Attached edges (approximated)    Drainage Amount Scant    Drainage Description Serous    Treatment Cleansed;Debridement (Selective)    Selective Debridement - Location B LE    Selective Debridement - Tools Used Forceps;Scalpel    Selective Debridement - Tissue Removed devitalized tissue  Wound Therapy - Clinical Statement all wounds measured and photographed today.  All wounds are improving with granulation and approximating well.  contiued with xeroform and profore lite to bil LE's    Wound Therapy - Functional Problem List walking, bathing, dressing    Factors Delaying/Impairing Wound Healing Multiple medical problems    Wound Therapy - Frequency 2X / week    Wound Therapy - Current Recommendations PT    Wound Plan debride as indicated, redress.  measure weekly (thursday or friday)    Dressing  B LE: xeroform to wound beds, profore lite with #1/#2 reversed; cotton applied to negative area.                       PT Short Term Goals - 11/10/20 1436       PT SHORT TERM GOAL #1   Title wounds on right will be 100% granulated    Time 3    Period Weeks    Status New    Target Date 12/01/20      PT SHORT TERM GOAL #2   Title wound on left will be 100% granulated    Time 3    Period Weeks    Status New    Target Date 12/01/20      PT SHORT TERM GOAL #3   Title Patient will have < 3 wounds on right leg    Time 3    Period Weeks    Status New    Target Date 12/01/20               PT Long Term Goals - 11/10/20 1437       PT LONG TERM GOAL #1   Title wounds on right will be completely healed    Time 6    Period Weeks    Status New    Target Date 12/22/20      PT LONG TERM GOAL #2   Title wounds on left will be completely healed    Time 6    Period Weeks    Status New    Target Date 12/22/20      PT LONG TERM GOAL #3   Title Patietn will  report importance of wearing compression garments daily    Time 6    Period Weeks    Status New    Target Date 12/22/20                    Patient will benefit from skilled therapeutic intervention in order to improve the following deficits and impairments:     Visit Diagnosis: Wound of left lower extremity, initial encounter  Difficulty in walking, not elsewhere classified  Multiple open wounds of right lower leg     Problem List Patient Active Problem List   Diagnosis Date Noted   Acute on chronic systolic CHF (congestive heart failure) ([place]) 10/28/2020   Noncompliance 12/16/2017   Elevated troponin 12/16/2017   Bronchitis 12/16/2017   COPD with acute exacerbation ([place]) 12/15/2017   Lipoma of extremity    Secondary cardiomyopathy ([place]) 10/14/2012   Atypical chest pain 10/13/2012   Chest pain 10/12/2012   Abnormal EKG 10/12/2012   GERD (gastroesophageal reflux disease) 10/12/2012   UNSPECIFIED ANEMIA 05/18/2008   ERECTILE DYSFUNCTION 09/25/2007   UNS PULMONARY TB-CULT DX 06/19/2007   UNDERWEIGHT 06/19/2007   OSTEOARTHRITIS, HIP, RIGHT 02/18/2006   HEPATITIS C 01/08/2006   DISORDER, TOBACCO USE 01/08/2006   MIGRAINE HEADACHE 01/08/2006  ALLERGIC RHINITIS 01/08/2006   GERD 01/08/2006   BPH (benign prostatic hyperplasia) 01/08/2006   OSTEOARTHRITIS 01/08/2006   Ival Pacer Sula Soda, PTA/CLT, WTA [phone]  Teena Irani, PTA 12/08/2020, 4:40 PM  [facility] [address] [place], [place], [address] Phone: [phone]   Fax:  [phone]  Name: James Cervantes MRN: [id number] Date of Birth: 1950-06-12

## 2020-12-09 ENCOUNTER — Ambulatory Visit (HOSPITAL_COMMUNITY)
Admission: RE | Admit: 2020-12-09 | Discharge: 2020-12-09 | Disposition: A | Discharge status: Home / Self Care | Payer: Medicare Other | Source: Ambulatory Visit | Attending: Student | Admitting: Student

## 2020-12-09 DIAGNOSIS — R1084 Generalized abdominal pain: Secondary | ICD-10-CM

## 2020-12-12 ENCOUNTER — Telehealth: Payer: Self-pay

## 2020-12-12 DIAGNOSIS — K7460 Unspecified cirrhosis of liver: Secondary | ICD-10-CM

## 2020-12-12 NOTE — Telephone Encounter (Signed)
-----   Message from Erma Heritage, [place] sent at 12/11/2020 11:50 AM EST ----- Please let the patient know his Abdominal US was suggestive of cirrhosis. Recent LFT's within normal limits. Would recommend referral to GI for additional evaluation. He certainly needs to continue with Lasix and Spironolactone which help with his fluid level.

## 2020-12-12 NOTE — Telephone Encounter (Signed)
Pt notified and verbalized understanding. GI referral entered.

## 2020-12-13 ENCOUNTER — Ambulatory Visit (HOSPITAL_COMMUNITY): Payer: Medicare Other | Admitting: Physical Therapy

## 2020-12-13 ENCOUNTER — Encounter: Payer: Self-pay | Admitting: Internal Medicine

## 2020-12-13 ENCOUNTER — Other Ambulatory Visit: Payer: Self-pay

## 2020-12-13 DIAGNOSIS — S81801A Unspecified open wound, right lower leg, initial encounter: Secondary | ICD-10-CM

## 2020-12-13 DIAGNOSIS — S81802A Unspecified open wound, left lower leg, initial encounter: Secondary | ICD-10-CM

## 2020-12-13 DIAGNOSIS — R262 Difficulty in walking, not elsewhere classified: Secondary | ICD-10-CM

## 2020-12-13 NOTE — Therapy (Signed)
[place], [place], [address] Phone: [phone]   Fax:  [phone]  Wound Care Therapy  Patient Details  Name: James Cervantes MRN: [id number] Date of Birth: 1950/08/26 No data recorded  Encounter Date: 12/13/2020   PT End of Session - 12/13/20 1650     Visit Number 9    Number of Visits 12    Date for PT Re-Evaluation 12/22/20    Authorization Type medicare primary and medicaid secondary    Progress Note Due on Visit 10    PT Start Time 1530    PT Stop Time 1625    PT Time Calculation (min) 55 min    Activity Tolerance Patient tolerated treatment well    Behavior During Therapy [facility] for tasks assessed/performed             Past Medical History:  Diagnosis Date   Anemia    Arthritis    Asthma    Cardiomyopathy ([place])    a. 2014: EF 40-45%, diffuse HK, and Grade 2 DD b. EF at 20-25% by echo in 04/2017 and 05/2020   Dyspnea    Emphysema    GERD (gastroesophageal reflux disease)    Hip pain    Hypertension    Peripheral vascular disease ([place])    Prostate disorder     Past Surgical History:  Procedure Laterality Date   ABDOMINAL SURGERY     CATARACT EXTRACTION W/PHACO Left 11/26/2016   Procedure: CATARACT EXTRACTION PHACO AND INTRAOCULAR LENS PLACEMENT LEFT EYE;  Surgeon: Tonny Branch, MD;  Location: [facility];  Service: Ophthalmology;  Laterality: Left;  CDE: 26.53    CATARACT EXTRACTION W/PHACO Right 01/28/2017   Procedure: CATARACT EXTRACTION PHACO AND INTRAOCULAR LENS PLACEMENT RIGHT EYE;  Surgeon: Tonny Branch, MD;  Location: [facility];  Service: Ophthalmology;  Laterality: Right;  CDE: 6.79   HERNIA REPAIR     LUNG SURGERY     MASS EXCISION Right 11/01/2017   Procedure: EXCISION CYST RIGHT KNEE;  Surgeon: Aviva Signs, MD;  Location: [facility];  Service: General;  Laterality: Right;   VASCULAR SURGERY      There were no vitals filed for this visit.               Wound Therapy - 12/13/20  1636     Subjective pt states a blister has come up between the two others on his Lt leg.  States his breathing is bad today and is suppose to be getting some more tests done regarding his other medical issues.    Patient and Family Stated Goals to have wounds heal    Date of Onset --   > month   Prior Treatments none - comes with xeroform and gauze on them    Pain Scale 0-10    Pain Score 0-No pain    Evaluation and Treatment Procedures Explained to Patient/Family Yes    Evaluation and Treatment Procedures agreed to    Wound Properties Date First Assessed: 12/13/20 Time First Assessed: 0400 Location: Pretibial Location Orientation: Left;Proximal Wound Description (Comments): Lt medial knee inferior to initial knee blister   Wound Image Images linked: 1    Dressing Type None    Dressing Changed New    Dressing Status Old drainage    Dressing Change Frequency PRN    Site / Wound Assessment Clean;Dry    % Wound base Red or Granulating 0%    Peri-wound Assessment Intact    Wound  Length (cm) 4 cm    Wound Width (cm) 4 cm    Wound Surface Area (cm^2) 16 cm^2    Drainage Amount Moderate    Drainage Description Serous    Treatment Cleansed;Debridement (Selective)    Wound Properties Date First Assessed: 12/08/20 Time First Assessed: 5956 Wound Type: Venous stasis ulcer Location: Knee Location Orientation: Anterior;Right Wound Description (Comments): Rt medial knee blister Present on Admission: No   Dressing Type Impregnated gauze (bismuth)    Dressing Changed Changed    Dressing Status Old drainage    Dressing Change Frequency PRN    Site / Wound Assessment Yellow;Red;Pink    % Wound base Red or Granulating 50%    % Wound base Yellow/Fibrinous Exudate 50%    Peri-wound Assessment Intact    Drainage Amount Minimal    Drainage Description Serosanguineous    Treatment Cleansed;Debridement (Selective)    Wound Properties Date First Assessed: 12/08/20 Time First Assessed: 1637 Wound Type:  Venous stasis ulcer Location: Knee Location Orientation: Anterior;Left Present on Admission: No   Dressing Type Impregnated gauze (bismuth)    Dressing Changed Changed    Dressing Status Old drainage    Dressing Change Frequency PRN    Site / Wound Assessment Yellow;Red;Pink    % Wound base Red or Granulating 20%    % Wound base Yellow/Fibrinous Exudate 80%    Drainage Amount Moderate    Drainage Description Serosanguineous    Treatment Cleansed;Debridement (Selective)    Wound Properties Date First Assessed: 11/10/20 Time First Assessed: 1315 Wound Type: Diabetic ulcer Location: Pretibial Location Orientation: Right Wound Description (Comments): lateral largest wound Present on Admission: Yes   Dressing Type Impregnated gauze (bismuth)    Dressing Changed Changed    Dressing Status None    % Wound base Red or Granulating 100%    Peri-wound Assessment Intact    Drainage Amount None    Treatment Cleansed    Wound Properties Date First Assessed: 11/10/20 Time First Assessed: 1315 Wound Type: Diabetic ulcer Location: Pretibial Location Orientation: Right Wound Description (Comments): medial wound Present on Admission: Yes   Dressing Type Impregnated gauze (bismuth)    Dressing Changed Changed    Dressing Status None    Dressing Change Frequency PRN    % Wound base Red or Granulating 100%    Peri-wound Assessment Intact    Drainage Amount None    Treatment Cleansed    Wound Properties Date First Assessed: 11/10/20 Time First Assessed: 1315 Wound Type: Diabetic ulcer Location: Pretibial Location Orientation: Left Wound Description (Comments): wound on left leg Present on Admission: Yes   Dressing Type Impregnated gauze (bismuth)    Dressing Changed Changed    Dressing Status None    % Wound base Red or Granulating 100%    Drainage Amount None    Treatment Cleansed    Selective Debridement - Location B LE    Selective Debridement - Tools Used Forceps;Scalpel    Selective Debridement -  Tissue Removed devitalized tissue    Wound Therapy - Clinical Statement New blister inferior to initial Lt knee blister.  measuring approximately 4X4 cm.  Cleansed and dressed this wound with xeroform.  All other wounds are mostly healed with exception of Rt and Lt medial knee wounds.    Wound Therapy - Functional Problem List walking, bathing, dressing    Factors Delaying/Impairing Wound Healing Multiple medical problems    Wound Therapy - Frequency 2X / week    Wound Therapy - Current Recommendations  PT    Wound Plan debride as indicated, redress.  measure weekly (thursday or friday)  F/U with new wound on medial Lt knee and begin training with stockings once wounds are healed.    Dressing  B LE: xeroform to wound beds, profore lite with more cotton applied to negative areas.  medial knees xerform, 4X4, medipore tape                       PT Short Term Goals - 11/10/20 1436       PT SHORT TERM GOAL #1   Title wounds on right will be 100% granulated    Time 3    Period Weeks    Status New    Target Date 12/01/20      PT SHORT TERM GOAL #2   Title wound on left will be 100% granulated    Time 3    Period Weeks    Status New    Target Date 12/01/20      PT SHORT TERM GOAL #3   Title Patient will have < 3 wounds on right leg    Time 3    Period Weeks    Status New    Target Date 12/01/20               PT Long Term Goals - 11/10/20 1437       PT LONG TERM GOAL #1   Title wounds on right will be completely healed    Time 6    Period Weeks    Status New    Target Date 12/22/20      PT LONG TERM GOAL #2   Title wounds on left will be completely healed    Time 6    Period Weeks    Status New    Target Date 12/22/20      PT LONG TERM GOAL #3   Title Patietn will report importance of wearing compression garments daily    Time 6    Period Weeks    Status New    Target Date 12/22/20                    Patient will benefit from skilled  therapeutic intervention in order to improve the following deficits and impairments:     Visit Diagnosis: Wound of left lower extremity, initial encounter  Difficulty in walking, not elsewhere classified  Multiple open wounds of right lower leg     Problem List Patient Active Problem List   Diagnosis Date Noted   Acute on chronic systolic CHF (congestive heart failure) ([place]) 10/28/2020   Noncompliance 12/16/2017   Elevated troponin 12/16/2017   Bronchitis 12/16/2017   COPD with acute exacerbation ([place]) 12/15/2017   Lipoma of extremity    Secondary cardiomyopathy ([place]) 10/14/2012   Atypical chest pain 10/13/2012   Chest pain 10/12/2012   Abnormal EKG 10/12/2012   GERD (gastroesophageal reflux disease) 10/12/2012   UNSPECIFIED ANEMIA 05/18/2008   ERECTILE DYSFUNCTION 09/25/2007   UNS PULMONARY TB-CULT DX 06/19/2007   UNDERWEIGHT 06/19/2007   OSTEOARTHRITIS, HIP, RIGHT 02/18/2006   HEPATITIS C 01/08/2006   DISORDER, TOBACCO USE 01/08/2006   MIGRAINE HEADACHE 01/08/2006   ALLERGIC RHINITIS 01/08/2006   GERD 01/08/2006   BPH (benign prostatic hyperplasia) 01/08/2006   OSTEOARTHRITIS 01/08/2006   Rushawn Capshaw B Mare Ferrari, PTA/CLT, WTA [phone]  Teena Irani, PTA 12/13/2020, 4:52 PM  [facility] [place]  [place], [place], [address] Phone: [phone]   Fax:  [phone]  Name: James Cervantes MRN: [id number] Date of Birth: 06/15/1950

## 2020-12-15 ENCOUNTER — Telehealth (HOSPITAL_COMMUNITY): Payer: Self-pay | Admitting: Physical Therapy

## 2020-12-15 ENCOUNTER — Encounter (HOSPITAL_COMMUNITY): Payer: Self-pay | Admitting: Physical Therapy

## 2020-12-15 ENCOUNTER — Other Ambulatory Visit: Payer: Self-pay

## 2020-12-15 ENCOUNTER — Ambulatory Visit (HOSPITAL_COMMUNITY): Payer: Medicare Other | Admitting: Physical Therapy

## 2020-12-15 DIAGNOSIS — S81802A Unspecified open wound, left lower leg, initial encounter: Secondary | ICD-10-CM

## 2020-12-15 DIAGNOSIS — S81801A Unspecified open wound, right lower leg, initial encounter: Secondary | ICD-10-CM

## 2020-12-15 DIAGNOSIS — R262 Difficulty in walking, not elsewhere classified: Secondary | ICD-10-CM

## 2020-12-15 NOTE — Telephone Encounter (Signed)
No show - called and patient stated he was told his apt was at [address]. Stated he would be here in 10 minutes. Told Patient that if he was over 30 minutes late he would not be able to be seen.   1:32 PM, 12/15/20 Jerene Pitch, DPT Physical Therapy with [facility]  [phone] office

## 2020-12-15 NOTE — Therapy (Signed)
[place], [place], [address] Phone: [phone]   Fax:  [phone]  Wound Care Therapy, Progress note, RECERT  Patient Details  Name: James Cervantes MRN: [id number] Date of Birth: 1950/09/26 No data recorded   Progress Note Reporting Period 11/10/20 to 12/15/20  See note below for Objective Data and Assessment of Progress/Goals.     Encounter Date: 12/15/2020   PT End of Session - 12/15/20 1428     Visit Number 10    Number of Visits 24    Date for PT Re-Evaluation 01/26/21    Authorization Type medicare primary and medicaid secondary    Progress Note Due on Visit 20    PT Start Time 1339   late to apt   PT Stop Time 1420    PT Time Calculation (min) 41 min    Activity Tolerance Patient tolerated treatment well    Behavior During Therapy WFL for tasks assessed/performed             Past Medical History:  Diagnosis Date   Anemia    Arthritis    Asthma    Cardiomyopathy ([place])    a. 2014: EF 40-45%, diffuse HK, and Grade 2 DD b. EF at 20-25% by echo in 04/2017 and 05/2020   Dyspnea    Emphysema    GERD (gastroesophageal reflux disease)    Hip pain    Hypertension    Peripheral vascular disease ([place])    Prostate disorder     Past Surgical History:  Procedure Laterality Date   ABDOMINAL SURGERY     CATARACT EXTRACTION W/PHACO Left 11/26/2016   Procedure: CATARACT EXTRACTION PHACO AND INTRAOCULAR LENS PLACEMENT LEFT EYE;  Surgeon: Tonny Branch, MD;  Location: [facility];  Service: Ophthalmology;  Laterality: Left;  CDE: 26.53    CATARACT EXTRACTION W/PHACO Right 01/28/2017   Procedure: CATARACT EXTRACTION PHACO AND INTRAOCULAR LENS PLACEMENT RIGHT EYE;  Surgeon: Tonny Branch, MD;  Location: [facility];  Service: Ophthalmology;  Laterality: Right;  CDE: 6.79   HERNIA REPAIR     LUNG SURGERY     MASS EXCISION Right 11/01/2017   Procedure: EXCISION CYST RIGHT KNEE;  Surgeon: Aviva Signs, MD;  Location:  [facility];  Service: General;  Laterality: Right;   VASCULAR SURGERY      There were no vitals filed for this visit.      [facility] PT Assessment - 12/15/20 0001       Assessment   Medical Diagnosis B cellulitis and wounds                     Wound Therapy - 12/15/20 0001     Subjective Reports he has noticed more swelling in his toes. States he can't taste food well so he eats salty foods.    Patient and Family Stated Goals to have wounds heal    Date of Onset --   > month   Prior Treatments none - comes with xeroform and gauze on them    Pain Scale 0-10    Pain Score 0-No pain    Evaluation and Treatment Procedures Explained to Patient/Family Yes    Evaluation and Treatment Procedures agreed to    Wound Properties Date First Assessed: 12/08/20 Time First Assessed: 2725 Wound Type: Venous stasis ulcer Location: Knee Location Orientation: Anterior;Right Wound Description (Comments): Rt medial knee blister Present on Admission: No   Dressing Type Impregnated gauze (bismuth)    Dressing  Changed Changed    Dressing Status Old drainage    Dressing Change Frequency PRN    Site / Wound Assessment Yellow;Pink    % Wound base Red or Granulating 60%    % Wound base Yellow/Fibrinous Exudate 40%    Peri-wound Assessment Intact;Edema    Wound Length (cm) 2 cm    Wound Width (cm) 3.6 cm    Wound Surface Area (cm^2) 7.2 cm^2    Margins Attached edges (approximated)    Drainage Amount Minimal    Drainage Description Serosanguineous    Treatment Cleansed;Debridement (Selective)    Wound Properties Date First Assessed: 11/10/20 Time First Assessed: 1315 Wound Type: Diabetic ulcer Location: Pretibial Location Orientation: Right Wound Description (Comments): lateral largest wound Present on Admission: Yes   Dressing Type Impregnated gauze (bismuth)    Dressing Changed Changed    Dressing Status None    Dressing Change Frequency PRN    Site / Wound Assessment Pink;Red    % Wound base  Red or Granulating 100%    Peri-wound Assessment Intact;Edema    Wound Length (cm) 1.6 cm    Wound Width (cm) 2.5 cm    Wound Surface Area (cm^2) 4 cm^2    Margins Attached edges (approximated)    Drainage Amount Scant    Drainage Description Serous    Treatment Cleansed    Wound Properties Date First Assessed: 12/08/20 Time First Assessed: 1637 Wound Type: Venous stasis ulcer Location: Knee Location Orientation: Anterior;Left Present on Admission: No   Dressing Type Impregnated gauze (bismuth)    Dressing Changed Changed    Dressing Change Frequency PRN    Site / Wound Assessment Pink;Red    % Wound base Red or Granulating 20%    % Wound base Yellow/Fibrinous Exudate 80%    Peri-wound Assessment Intact    Wound Length (cm) 3.5 cm    Wound Width (cm) 1.7 cm    Wound Surface Area (cm^2) 5.95 cm^2    Margins Attached edges (approximated)    Drainage Amount Scant    Drainage Description Serous    Treatment Cleansed;Debridement (Selective)    Wound Properties Date First Assessed: 12/13/20 Time First Assessed: 0400 Location: Pretibial Location Orientation: Left;Proximal Wound Description (Comments): Lt medial knee inferior to initial knee blister   Dressing Type Impregnated gauze (bismuth)    Dressing Changed Changed    Dressing Status Old drainage    Dressing Change Frequency PRN    Site / Wound Assessment Clean;Red;Pale    % Wound base Red or Granulating 100%   after debridement   Peri-wound Assessment Intact;Edema    Wound Length (cm) 4.2 cm    Wound Width (cm) 4 cm    Wound Surface Area (cm^2) 16.8 cm^2    Margins Unattached edges (unapproximated)    Drainage Amount Minimal    Drainage Description Serous    Treatment Cleansed;Debridement (Selective)    Wound Properties Date First Assessed: 11/10/20 Time First Assessed: 1315 Wound Type: Diabetic ulcer Location: Pretibial Location Orientation: Right Wound Description (Comments): medial wound Present on Admission: Yes   Dressing  Type Impregnated gauze (bismuth)    Dressing Changed Changed    Dressing Status None    Dressing Change Frequency PRN    % Wound base Red or Granulating 100%    Peri-wound Assessment Edema    Margins Attached edges (approximated)    Drainage Amount None    Treatment Cleansed    Wound Properties Date First Assessed: 12/15/20 Wound Type: Diabetic ulcer Location:  Pretibial Location Orientation: Proximal;Right Wound Description (Comments): right lateral near knee Present on Admission: No   Dressing Changed New    Dressing Status Old drainage    Site / Wound Assessment Pale;Pink    % Wound base Other/Granulation Tissue (Comment) 100%    Peri-wound Assessment Edema    Wound Length (cm) 2.2 cm    Wound Width (cm) 4.2 cm    Wound Surface Area (cm^2) 9.24 cm^2    Margins Unattached edges (unapproximated)    Drainage Description Serous    Treatment Cleansed;Debridement (Selective)    Selective Debridement - Location B LE    Selective Debridement - Tools Used Forceps;Scalpel    Selective Debridement - Tissue Removed devitalized tissue    Wound Therapy - Clinical Statement Overall wounds are reducing in size and improving in granulation but additioanl wounds continue to appear. New wound on right leg noted today from blister, but one of old wounds completely healed at this time. Continued with selective debridmenet and applied honey to wounds with >50% slough and xerform to wounds with < 50% slough. Continued with profore lite. Applied liberal amount of lotion an vaeoline to help improve skin integrity. No goals met at this time. ExtendingPOC to continue to work on current POC.    Wound Therapy - Functional Problem List walking, bathing, dressing    Factors Delaying/Impairing Wound Healing Multiple medical problems    Wound Therapy - Frequency 2X / week    Wound Therapy - Current Recommendations PT    Wound Plan debride as indicated, redress.  measure weekly (thursday or friday)  F/U with new wound  on medial Lt knee and begin training with stockings once wounds are healed.    Dressing  B LE: xeroform to wound beds with <50% slough and medihoney to awound beds with >50% slough, profore lite with more cotton applied to negative areas.  medial knees , 4X4, abd pad and medipore tape                       PT Short Term Goals - 12/15/20 1431       PT SHORT TERM GOAL #1   Title wounds on right will be 100% granulated    Time 3    Period Weeks    Status On-going    Target Date 12/01/20      PT SHORT TERM GOAL #2   Title wound on left will be 100% granulated    Time 3    Period Weeks    Status On-going    Target Date 12/01/20      PT SHORT TERM GOAL #3   Title Patient will have < 3 wounds on right leg    Baseline 4 - one new    Time 3    Period Weeks    Status On-going    Target Date 12/01/20               PT Long Term Goals - 12/15/20 1431       PT LONG TERM GOAL #1   Title wounds on right will be completely healed    Time 6    Period Weeks    Status Not Met    Target Date 12/22/20      PT LONG TERM GOAL #2   Title wounds on left will be completely healed    Time 6    Period Weeks    Status On-going    Target Date 12/22/20  PT LONG TERM GOAL #3   Title Patietn will report importance of wearing compression garments daily    Time 6    Period Weeks    Status On-going    Target Date 12/22/20                    Patient will benefit from skilled therapeutic intervention in order to improve the following deficits and impairments:     Visit Diagnosis: Wound of left lower extremity, initial encounter  Difficulty in walking, not elsewhere classified  Multiple open wounds of right lower leg     Problem List Patient Active Problem List   Diagnosis Date Noted   Acute on chronic systolic CHF (congestive heart failure) ([place]) 10/28/2020   Noncompliance 12/16/2017   Elevated troponin 12/16/2017   Bronchitis 12/16/2017   COPD  with acute exacerbation ([place]) 12/15/2017   Lipoma of extremity    Secondary cardiomyopathy ([place]) 10/14/2012   Atypical chest pain 10/13/2012   Chest pain 10/12/2012   Abnormal EKG 10/12/2012   GERD (gastroesophageal reflux disease) 10/12/2012   UNSPECIFIED ANEMIA 05/18/2008   ERECTILE DYSFUNCTION 09/25/2007   UNS PULMONARY TB-CULT DX 06/19/2007   UNDERWEIGHT 06/19/2007   OSTEOARTHRITIS, HIP, RIGHT 02/18/2006   HEPATITIS C 01/08/2006   DISORDER, TOBACCO USE 01/08/2006   MIGRAINE HEADACHE 01/08/2006   ALLERGIC RHINITIS 01/08/2006   GERD 01/08/2006   BPH (benign prostatic hyperplasia) 01/08/2006   OSTEOARTHRITIS 01/08/2006    3:34 PM, 12/15/20 Jerene Pitch, DPT Physical Therapy with [facility]  [phone] office   [place], [place], [address] Phone: [phone]   Fax:  [phone]  Name: James Cervantes MRN: [id number] Date of Birth: 08-19-1950

## 2020-12-20 ENCOUNTER — Ambulatory Visit (HOSPITAL_COMMUNITY): Payer: Medicare Other | Admitting: Physical Therapy

## 2020-12-20 ENCOUNTER — Other Ambulatory Visit: Payer: Self-pay

## 2020-12-20 DIAGNOSIS — S81802A Unspecified open wound, left lower leg, initial encounter: Secondary | ICD-10-CM | POA: Diagnosis not present

## 2020-12-20 DIAGNOSIS — S81801A Unspecified open wound, right lower leg, initial encounter: Secondary | ICD-10-CM

## 2020-12-20 DIAGNOSIS — R262 Difficulty in walking, not elsewhere classified: Secondary | ICD-10-CM

## 2020-12-20 NOTE — Therapy (Signed)
James Cervantes, [place], [address] Phone: [phone]   Fax:  [phone]  Wound Care Therapy  Patient Details  Name: James Cervantes MRN: [id number] Date of Birth: 1950-10-14 No data recorded  Encounter Date: 12/20/2020   PT End of Session - 12/20/20 1631     Visit Number 11    Number of Visits 24    Date for PT Re-Evaluation 01/26/21    Authorization Type medicare primary and medicaid secondary    Progress Note Due on Visit 20    PT Start Time 1408    PT Stop Time 1458    PT Time Calculation (min) 50 min    Activity Tolerance Patient tolerated treatment well    Behavior During Therapy [facility] for tasks assessed/performed             Past Medical History:  Diagnosis Date   Anemia    Arthritis    Asthma    Cardiomyopathy ([place])    a. 2014: EF 40-45%, diffuse HK, and Grade 2 DD b. EF at 20-25% by echo in 04/2017 and 05/2020   Dyspnea    Emphysema    GERD (gastroesophageal reflux disease)    Hip pain    Hypertension    Peripheral vascular disease ([place])    Prostate disorder     Past Surgical History:  Procedure Laterality Date   ABDOMINAL SURGERY     CATARACT EXTRACTION W/PHACO Left 11/26/2016   Procedure: CATARACT EXTRACTION PHACO AND INTRAOCULAR LENS PLACEMENT LEFT EYE;  Surgeon: Tonny Branch, MD;  Location: [facility];  Service: Ophthalmology;  Laterality: Left;  CDE: 26.53    CATARACT EXTRACTION W/PHACO Right 01/28/2017   Procedure: CATARACT EXTRACTION PHACO AND INTRAOCULAR LENS PLACEMENT RIGHT EYE;  Surgeon: Tonny Branch, MD;  Location: [facility];  Service: Ophthalmology;  Laterality: Right;  CDE: 6.79   HERNIA REPAIR     LUNG SURGERY     MASS EXCISION Right 11/01/2017   Procedure: EXCISION CYST RIGHT KNEE;  Surgeon: Aviva Signs, MD;  Location: [facility];  Service: General;  Laterality: Right;   VASCULAR SURGERY      There were no vitals filed for this visit.               Wound Therapy - 12/20/20  1559     Subjective Pt states he feels everything is getting better.Able to keep bandages intact    Patient and Family Stated Goals to have wounds heal    Date of Onset --   > month   Prior Treatments none - comes with xeroform and gauze on them    Pain Scale 0-10    Pain Score 0-No pain    Evaluation and Treatment Procedures Explained to Patient/Family Yes    Evaluation and Treatment Procedures agreed to    Wound Properties Date First Assessed: 12/13/20 Time First Assessed: 0400 Location: Pretibial Location Orientation: Left;Proximal Wound Description (Comments): Lt medial knee inferior to initial knee blister   Dressing Type Impregnated gauze (bismuth)    Dressing Changed Changed    Dressing Status Old drainage    Dressing Change Frequency PRN    Site / Wound Assessment Pink;Pale    % Wound base Red or Granulating 95%    % Wound base Yellow/Fibrinous Exudate 5%    Peri-wound Assessment Intact    Margins Unattached edges (unapproximated)    Drainage Amount Minimal    Drainage Description Serosanguineous    Treatment Cleansed;Debridement (Selective)  Wound Properties Date First Assessed: 12/08/20 Time First Assessed: 2637 Wound Type: Venous stasis ulcer Location: Knee Location Orientation: Anterior;Right Wound Description (Comments): Rt medial knee blister Present on Admission: No   Dressing Type Impregnated gauze (bismuth)    Dressing Changed Changed    Dressing Status Old drainage    Dressing Change Frequency PRN    Site / Wound Assessment Yellow;Red    % Wound base Red or Granulating 80%    % Wound base Yellow/Fibrinous Exudate 20%    Peri-wound Assessment Intact;Edema    Margins Attached edges (approximated)    Drainage Amount Minimal    Drainage Description Serosanguineous    Treatment Cleansed;Debridement (Selective)    Wound Properties Date First Assessed: 12/08/20 Time First Assessed: 1637 Wound Type: Venous stasis ulcer Location: Knee Location Orientation: Anterior;Left  Present on Admission: No   Dressing Type Impregnated gauze (bismuth)    Dressing Changed Changed    Dressing Status Old drainage    Dressing Change Frequency PRN    Site / Wound Assessment Pink;Red    % Wound base Red or Granulating 80%    % Wound base Yellow/Fibrinous Exudate 20%    Peri-wound Assessment Intact    Margins Attached edges (approximated)    Drainage Amount Scant    Drainage Description Serous    Treatment Cleansed;Debridement (Selective)    Wound Properties Date First Assessed: 11/10/20 Time First Assessed: 1315 Wound Type: Diabetic ulcer Location: Pretibial Location Orientation: Right Wound Description (Comments): medial wound Present on Admission: Yes   Dressing Type Impregnated gauze (bismuth)    Dressing Changed Changed    Dressing Status None    Dressing Change Frequency PRN    % Wound base Red or Granulating 100%    Peri-wound Assessment Edema    Margins Attached edges (approximated)    Drainage Amount None    Treatment Cleansed;Debridement (Selective)    Wound Properties Date First Assessed: 12/15/20 Wound Type: Diabetic ulcer Location: Pretibial Location Orientation: Proximal;Right Wound Description (Comments): right lateral near knee Present on Admission: No   Dressing Changed Changed    Dressing Status Old drainage    Site / Wound Assessment Pale;Pink    % Wound base Other/Granulation Tissue (Comment) 100%    Margins Attached edges (approximated)    Drainage Description Serosanguineous    Treatment Debridement (Selective);Cleansed    Selective Debridement - Location B LE    Selective Debridement - Tools Used Forceps;Scalpel    Selective Debridement - Tissue Removed devitalized tissue    Wound Therapy - Clinical Statement all original wounds mostly healed at this point with exception or small wound Rt anterior tib region.  Pt with 2 blisters remaining at each medial knee (4 total) Able to debride all remaining slough from these and continued with xeroform  dressing and profore.  Pt instructed to check his PObox to see if he has received his compression stockings.  Instructed to bring them with him to his appt next session.    Wound Therapy - Functional Problem List walking, bathing, dressing    Factors Delaying/Impairing Wound Healing Multiple medical problems    Wound Therapy - Frequency 2X / week    Wound Therapy - Current Recommendations PT    Wound Plan debride as indicated, redress.  measure weekly (thursday or friday)  F/U with new wound on medial Lt knee and begin training with stockings once wounds are healed.    Dressing  B LE: xeroform to wound beds, gauze and medipore tape followed by profore  lite                       PT Short Term Goals - 12/15/20 1431       PT SHORT TERM GOAL #1   Title wounds on right will be 100% granulated    Time 3    Period Weeks    Status On-going    Target Date 12/01/20      PT SHORT TERM GOAL #2   Title wound on left will be 100% granulated    Time 3    Period Weeks    Status On-going    Target Date 12/01/20      PT SHORT TERM GOAL #3   Title Patient will have < 3 wounds on right leg    Baseline 4 - one new    Time 3    Period Weeks    Status On-going    Target Date 12/01/20               PT Long Term Goals - 12/15/20 1431       PT LONG TERM GOAL #1   Title wounds on right will be completely healed    Time 6    Period Weeks    Status Not Met    Target Date 12/22/20      PT LONG TERM GOAL #2   Title wounds on left will be completely healed    Time 6    Period Weeks    Status On-going    Target Date 12/22/20      PT LONG TERM GOAL #3   Title Patietn will report importance of wearing compression garments daily    Time 6    Period Weeks    Status On-going    Target Date 12/22/20                   Plan - 12/20/20 1643     Personal Factors and Comorbidities Comorbidity 1;Comorbidity 2;Comorbidity 3+    Comorbidities COPD, CHF, EF 25%     Examination-Activity Limitations Stairs;Bed Mobility;Locomotion Level;Hygiene/Grooming    Stability/Clinical Decision Making Evolving/Moderate complexity    Rehab Potential Fair    PT Frequency 2x / week    PT Duration 6 weeks    PT Treatment/Interventions ADLs/Self Care Home Management;Therapeutic exercise;Manual techniques;Other (comment)   wound care and modalities as needed   PT Home Exercise Plan ankle pumps    Consulted and Agree with Plan of Care Patient             Patient will benefit from skilled therapeutic intervention in order to improve the following deficits and impairments:  Pain, Difficulty walking, Decreased skin integrity, Increased edema, Decreased scar mobility, Decreased knowledge of precautions  Visit Diagnosis: Wound of left lower extremity, initial encounter  Difficulty in walking, not elsewhere classified  Multiple open wounds of right lower leg     Problem List Patient Active Problem List   Diagnosis Date Noted   Acute on chronic systolic CHF (congestive heart failure) ([place]) 10/28/2020   Noncompliance 12/16/2017   Elevated troponin 12/16/2017   Bronchitis 12/16/2017   COPD with acute exacerbation ([place]) 12/15/2017   Lipoma of extremity    Secondary cardiomyopathy ([place]) 10/14/2012   Atypical chest pain 10/13/2012   Chest pain 10/12/2012   Abnormal EKG 10/12/2012   GERD (gastroesophageal reflux disease) 10/12/2012   UNSPECIFIED ANEMIA 05/18/2008   ERECTILE DYSFUNCTION 09/25/2007   UNS PULMONARY TB-CULT DX 06/19/2007  UNDERWEIGHT 06/19/2007   OSTEOARTHRITIS, HIP, RIGHT 02/18/2006   HEPATITIS C 01/08/2006   DISORDER, TOBACCO USE 01/08/2006   MIGRAINE HEADACHE 01/08/2006   ALLERGIC RHINITIS 01/08/2006   GERD 01/08/2006   BPH (benign prostatic hyperplasia) 01/08/2006   OSTEOARTHRITIS 01/08/2006   Zoella Roberti B Mare Ferrari, PTA/CLT, WTA [phone]  Teena Irani, PTA 12/20/2020, 4:47 PM  [facility] [address] [place], [place], [address] Phone: [phone]   Fax:  [phone]  Name: MONTRAE BRAITHWAITE MRN: [id number] Date of Birth: [birth year]

## 2020-12-21 ENCOUNTER — Other Ambulatory Visit: Payer: Self-pay

## 2020-12-21 DIAGNOSIS — K7460 Unspecified cirrhosis of liver: Secondary | ICD-10-CM

## 2020-12-22 ENCOUNTER — Ambulatory Visit (HOSPITAL_COMMUNITY): Payer: Medicare Other | Admitting: Physical Therapy

## 2020-12-22 ENCOUNTER — Other Ambulatory Visit: Payer: Self-pay

## 2020-12-22 DIAGNOSIS — S81802A Unspecified open wound, left lower leg, initial encounter: Secondary | ICD-10-CM

## 2020-12-22 DIAGNOSIS — S81801A Unspecified open wound, right lower leg, initial encounter: Secondary | ICD-10-CM

## 2020-12-22 DIAGNOSIS — R262 Difficulty in walking, not elsewhere classified: Secondary | ICD-10-CM

## 2020-12-22 NOTE — Therapy (Signed)
[place], [place], [address] Phone: [phone]   Fax:  [phone]  Wound Care Therapy  Patient Details  Name: James Cervantes MRN: [id number] Date of Birth: 09/27/1950 No data recorded  Encounter Date: 12/22/2020   PT End of Session - 12/22/20 1220     Visit Number 12    Number of Visits 24    Date for PT Re-Evaluation 01/26/21    Authorization Type medicare primary and medicaid secondary    Progress Note Due on Visit 20    PT Start Time 1042    PT Stop Time 1133    PT Time Calculation (min) 51 min    Activity Tolerance Patient tolerated treatment well    Behavior During Therapy [facility] for tasks assessed/performed             Past Medical History:  Diagnosis Date   Anemia    Arthritis    Asthma    Cardiomyopathy ([place])    a. 2014: EF 40-45%, diffuse HK, and Grade 2 DD b. EF at 20-25% by echo in 04/2017 and 05/2020   Dyspnea    Emphysema    GERD (gastroesophageal reflux disease)    Hip pain    Hypertension    Peripheral vascular disease ([place])    Prostate disorder     Past Surgical History:  Procedure Laterality Date   ABDOMINAL SURGERY     CATARACT EXTRACTION W/PHACO Left 11/26/2016   Procedure: CATARACT EXTRACTION PHACO AND INTRAOCULAR LENS PLACEMENT LEFT EYE;  Surgeon: Tonny Branch, MD;  Location: [facility];  Service: Ophthalmology;  Laterality: Left;  CDE: 26.53    CATARACT EXTRACTION W/PHACO Right 01/28/2017   Procedure: CATARACT EXTRACTION PHACO AND INTRAOCULAR LENS PLACEMENT RIGHT EYE;  Surgeon: Tonny Branch, MD;  Location: [facility];  Service: Ophthalmology;  Laterality: Right;  CDE: 6.79   HERNIA REPAIR     LUNG SURGERY     MASS EXCISION Right 11/01/2017   Procedure: EXCISION CYST RIGHT KNEE;  Surgeon: Aviva Signs, MD;  Location: [facility];  Service: General;  Laterality: Right;   VASCULAR SURGERY      There were no vitals filed for this visit.               Wound Therapy - 12/22/20  1202     Subjective no issues or complaints    Patient and Family Stated Goals to have wounds heal    Date of Onset --   > month   Prior Treatments none - comes with xeroform and gauze on them    Pain Scale 0-10    Pain Score 0-No pain    Evaluation and Treatment Procedures Explained to Patient/Family Yes    Evaluation and Treatment Procedures agreed to    Wound Properties Date First Assessed: 12/22/20 Location: Pretibial Location Orientation: Right;Medial Wound Description (Comments): inferior medial Rt knee wound   Wound Image Images linked: 1    Dressing Type Impregnated gauze (bismuth)    Dressing Changed Changed    Dressing Change Frequency PRN    Site / Wound Assessment Yellow;Red;Pink    % Wound base Red or Granulating 95%    % Wound base Yellow/Fibrinous Exudate 5%    Peri-wound Assessment Intact    Wound Length (cm) 1.5 cm    Wound Width (cm) 3.7 cm    Wound Depth (cm) 0 cm    Wound Volume (cm^3) 0 cm^3    Wound Surface Area (cm^2) 5.55  cm^2    Margins Attached edges (approximated)    Drainage Amount Minimal    Drainage Description Serosanguineous    Treatment Cleansed;Debridement (Selective)    Wound Properties Date First Assessed: 12/22/20 Location: Pretibial Location Orientation: Left;Medial Wound Description (Comments): superior to inferior blister Lt medial knee   Wound Image Images linked: 1    Dressing Type Impregnated gauze (bismuth)    Dressing Changed Changed    Dressing Status Intact    Dressing Change Frequency PRN    Site / Wound Assessment Red;Pink;Yellow    % Wound base Red or Granulating 90%    % Wound base Yellow/Fibrinous Exudate 15%    Peri-wound Assessment Intact    Wound Length (cm) 3.3 cm    Wound Width (cm) 3 cm    Wound Depth (cm) 0 cm    Wound Volume (cm^3) 0 cm^3    Wound Surface Area (cm^2) 9.9 cm^2    Margins Attached edges (approximated)    Drainage Amount Scant    Drainage Description Serous    Treatment Cleansed;Debridement  (Selective)    Wound Properties Date First Assessed: 12/13/20 Time First Assessed: 0400 Location: Pretibial Location Orientation: Left;Proximal Wound Description (Comments): Lt medial knee inferior to initial knee blister   Wound Image Images linked: 1    Dressing Type Impregnated gauze (bismuth)    Dressing Changed Changed    Dressing Status Old drainage    Dressing Change Frequency PRN    Site / Wound Assessment Red;Yellow    % Wound base Red or Granulating 95%    % Wound base Yellow/Fibrinous Exudate 5%    Peri-wound Assessment Intact    Wound Length (cm) 1.5 cm    Wound Width (cm) 3 cm    Wound Depth (cm) 0 cm    Wound Volume (cm^3) 0 cm^3    Wound Surface Area (cm^2) 4.5 cm^2    Margins Attached edges (approximated)    Drainage Amount Minimal    Drainage Description Serosanguineous    Treatment Cleansed;Debridement (Selective)    Wound Properties Date First Assessed: 12/08/20 Time First Assessed: 1635 Wound Type: Venous stasis ulcer Location: Knee Location Orientation: Anterior;Right Wound Description (Comments): Rt medial knee blister Present on Admission: No   Wound Image Images linked: 1    Dressing Type Impregnated gauze (bismuth)    Dressing Changed Changed    Dressing Status Old drainage    Dressing Change Frequency PRN    Site / Wound Assessment Red;Yellow;Pink    % Wound base Red or Granulating 95%    % Wound base Yellow/Fibrinous Exudate 5%    Peri-wound Assessment Intact    Wound Length (cm) 4 cm    Wound Width (cm) 4 cm    Wound Depth (cm) 0 cm    Wound Volume (cm^3) 0 cm^3    Wound Surface Area (cm^2) 16 cm^2    Margins Attached edges (approximated)    Drainage Amount Scant    Drainage Description Serous    Treatment Cleansed;Debridement (Selective)    Wound Properties Date First Assessed: 11/10/20 Time First Assessed: 1315 Wound Type: Diabetic ulcer Location: Pretibial Location Orientation: Right Wound Description (Comments): medial wound Present on Admission:  Yes   Wound Image Images linked: 1    Dressing Type Impregnated gauze (bismuth)    Dressing Changed Changed    Dressing Status Intact    Dressing Change Frequency PRN    Site / Wound Assessment Red;Yellow    % Wound base Red or Granulating 100%  Wound Length (cm) 0.8 cm    Wound Width (cm) 0.4 cm    Wound Depth (cm) 0.1 cm    Wound Volume (cm^3) 0.03 cm^3    Wound Surface Area (cm^2) 0.32 cm^2    Margins Attached edges (approximated)    Drainage Amount None    Treatment Cleansed;Debridement (Selective)    Wound Properties Date First Assessed: 11/10/20 Time First Assessed: 1315 Wound Type: Diabetic ulcer Location: Pretibial Location Orientation: Left Wound Description (Comments): wound on left leg Present on Admission: Yes   Wound Image Images linked: 1    Dressing Type Impregnated gauze (bismuth)    Dressing Changed Changed    Dressing Status Intact    Dressing Change Frequency PRN    Site / Wound Assessment Pink;Red    % Wound base Red or Granulating 100%    Wound Length (cm) 3 cm    Wound Width (cm) 1.4 cm    Wound Depth (cm) 0 cm    Wound Volume (cm^3) 0 cm^3    Wound Surface Area (cm^2) 4.2 cm^2    Drainage Amount Scant    Drainage Description Serous    Treatment Cleansed;Debridement (Selective)    Selective Debridement - Location B LE    Selective Debridement - Tools Used Forceps;Scalpel    Selective Debridement - Tissue Removed devitalized tissue    Wound Therapy - Clinical Statement all wounds are healing well.  Should have his knee high garments next week and may be able to transition to these and only bandage the top medial knees.  Pt tolerates debridement well overall. Xerform continues to work well for providing correct healing environment.    Wound Therapy - Functional Problem List walking, bathing, dressing    Factors Delaying/Impairing Wound Healing Multiple medical problems    Wound Therapy - Frequency 2X / week    Wound Therapy - Current Recommendations PT     Wound Plan debride as indicated, redress.  measure weekly (thursday or friday)  F/U with new wound on medial Lt knee and begin training with stockings once wounds are healed.    Dressing  B LE: xeroform to wound beds, gauze and medipore tape followed by profore lite                       PT Short Term Goals - 12/15/20 1431       PT SHORT TERM GOAL #1   Title wounds on right will be 100% granulated    Time 3    Period Weeks    Status On-going    Target Date 12/01/20      PT SHORT TERM GOAL #2   Title wound on left will be 100% granulated    Time 3    Period Weeks    Status On-going    Target Date 12/01/20      PT SHORT TERM GOAL #3   Title Patient will have < 3 wounds on right leg    Baseline 4 - one new    Time 3    Period Weeks    Status On-going    Target Date 12/01/20               PT Long Term Goals - 12/15/20 1431       PT LONG TERM GOAL #1   Title wounds on right will be completely healed    Time 6    Period Weeks    Status Not Met  Target Date 12/22/20      PT LONG TERM GOAL #2   Title wounds on left will be completely healed    Time 6    Period Weeks    Status On-going    Target Date 12/22/20      PT LONG TERM GOAL #3   Title Patietn will report importance of wearing compression garments daily    Time 6    Period Weeks    Status On-going    Target Date 12/22/20                    Patient will benefit from skilled therapeutic intervention in order to improve the following deficits and impairments:     Visit Diagnosis: Wound of left lower extremity, initial encounter  Difficulty in walking, not elsewhere classified  Multiple open wounds of right lower leg     Problem List Patient Active Problem List   Diagnosis Date Noted   Acute on chronic systolic CHF (congestive heart failure) ([place]) 10/28/2020   Noncompliance 12/16/2017   Elevated troponin 12/16/2017   Bronchitis 12/16/2017   COPD with acute  exacerbation ([place]) 12/15/2017   Lipoma of extremity    Secondary cardiomyopathy ([place]) 10/14/2012   Atypical chest pain 10/13/2012   Chest pain 10/12/2012   Abnormal EKG 10/12/2012   GERD (gastroesophageal reflux disease) 10/12/2012   UNSPECIFIED ANEMIA 05/18/2008   ERECTILE DYSFUNCTION 09/25/2007   UNS PULMONARY TB-CULT DX 06/19/2007   UNDERWEIGHT 06/19/2007   OSTEOARTHRITIS, HIP, RIGHT 02/18/2006   HEPATITIS C 01/08/2006   DISORDER, TOBACCO USE 01/08/2006   MIGRAINE HEADACHE 01/08/2006   ALLERGIC RHINITIS 01/08/2006   GERD 01/08/2006   BPH (benign prostatic hyperplasia) 01/08/2006   OSTEOARTHRITIS 01/08/2006   Averil Digman Sula Soda, PTA/CLT, WTA [phone]  Teena Irani, PTA 12/22/2020, 12:22 PM  [facility] [address] [place], [place], [address] Phone: [phone]   Fax:  [phone]  Name: James Cervantes MRN: [id number] Date of Birth: 02/11/1950

## 2020-12-27 ENCOUNTER — Ambulatory Visit (HOSPITAL_COMMUNITY): Payer: Medicare Other | Admitting: Physical Therapy

## 2020-12-27 ENCOUNTER — Other Ambulatory Visit: Payer: Self-pay

## 2020-12-27 DIAGNOSIS — S81802A Unspecified open wound, left lower leg, initial encounter: Secondary | ICD-10-CM | POA: Diagnosis not present

## 2020-12-27 DIAGNOSIS — S81801A Unspecified open wound, right lower leg, initial encounter: Secondary | ICD-10-CM

## 2020-12-27 DIAGNOSIS — R262 Difficulty in walking, not elsewhere classified: Secondary | ICD-10-CM

## 2020-12-27 NOTE — Therapy (Signed)
[place], [place], [address] Phone: [phone]   Fax:  [phone]  Wound Care Therapy  Patient Details  Name: James Cervantes MRN: [id number] Date of Birth: February 17, 1950 No data recorded  Encounter Date: 12/27/2020   PT End of Session - 12/27/20 1644     Visit Number 13    Number of Visits 24    Date for PT Re-Evaluation 01/26/21    Authorization Type medicare primary and medicaid secondary    Progress Note Due on Visit 20    PT Start Time 1315    PT Stop Time 1400    PT Time Calculation (min) 45 min    Activity Tolerance Patient tolerated treatment well    Behavior During Therapy [facility] for tasks assessed/performed             Past Medical History:  Diagnosis Date   Anemia    Arthritis    Asthma    Cardiomyopathy ([place])    a. 2014: EF 40-45%, diffuse HK, and Grade 2 DD b. EF at 20-25% by echo in 04/2017 and 05/2020   Dyspnea    Emphysema    GERD (gastroesophageal reflux disease)    Hip pain    Hypertension    Peripheral vascular disease ([place])    Prostate disorder     Past Surgical History:  Procedure Laterality Date   ABDOMINAL SURGERY     CATARACT EXTRACTION W/PHACO Left 11/26/2016   Procedure: CATARACT EXTRACTION PHACO AND INTRAOCULAR LENS PLACEMENT LEFT EYE;  Surgeon: Tonny Branch, MD;  Location: [facility];  Service: Ophthalmology;  Laterality: Left;  CDE: 26.53    CATARACT EXTRACTION W/PHACO Right 01/28/2017   Procedure: CATARACT EXTRACTION PHACO AND INTRAOCULAR LENS PLACEMENT RIGHT EYE;  Surgeon: Tonny Branch, MD;  Location: [facility];  Service: Ophthalmology;  Laterality: Right;  CDE: 6.79   HERNIA REPAIR     LUNG SURGERY     MASS EXCISION Right 11/01/2017   Procedure: EXCISION CYST RIGHT KNEE;  Surgeon: Aviva Signs, MD;  Location: [facility];  Service: General;  Laterality: Right;   VASCULAR SURGERY      There were no vitals filed for this visit.               Wound Therapy - 12/27/20  0001     Subjective PT states that his breathing has been bad, limiting what he can do.  No complaint of leg pain    Patient and Family Stated Goals to have wounds heal    Date of Onset --   > month   Prior Treatments none - comes with xeroform and gauze on them    Pain Scale 0-10    Pain Score 0-No pain    Evaluation and Treatment Procedures Explained to Patient/Family Yes    Evaluation and Treatment Procedures agreed to    Wound Properties Date First Assessed: 12/27/20 Time First Assessed: 1330 Location Orientation: Right Wound Description (Comments): Most inferior wound   Site / Wound Assessment Clean;Red    % Wound base Red or Granulating 100%    Wound Length (cm) 1 cm    Wound Width (cm) 1.5 cm    Wound Surface Area (cm^2) 1.5 cm^2    Drainage Amount Minimal    Drainage Description Serous    Treatment Cleansed    Wound Properties Date First Assessed: 12/22/20 Location: Pretibial Location Orientation: Right;Medial Wound Description (Comments): inferior medial Rt knee wound   Dressing Type Impregnated  gauze (bismuth);Compression wrap;Hydrocolloid    Dressing Changed Changed    Dressing Change Frequency PRN    % Wound base Red or Granulating 100%    Wound Length (cm) 1.8 cm    Wound Width (cm) 3.8 cm    Wound Surface Area (cm^2) 6.84 cm^2    Drainage Amount Minimal    Drainage Description Purulent    Treatment Cleansed;Debridement (Selective)    Wound Properties Date First Assessed: 12/22/20 Location: Pretibial Location Orientation: Left;Medial Wound Description (Comments): superior to inferior blister Lt medial knee   Dressing Type Compression wrap;Impregnated gauze (bismuth)    Dressing Changed Changed    Dressing Status Clean;Old drainage    Dressing Change Frequency PRN    Site / Wound Assessment Pink;Yellow    % Wound base Red or Granulating 95%    % Wound base Yellow/Fibrinous Exudate 5%    Wound Length (cm) --   this actually has 3 small areas open in the area all other  aspects healeed   Drainage Amount Minimal    Drainage Description Serous    Treatment Cleansed;Debridement (Selective)    Wound Properties Date First Assessed: 12/13/20 Time First Assessed: 0400 Location: Pretibial Location Orientation: Left;Proximal Wound Description (Comments): Lt medial knee inferior to initial knee blister   Dressing Type Impregnated gauze (bismuth);Compression wrap    Dressing Changed Changed    Dressing Status Old drainage    Dressing Change Frequency PRN    Site / Wound Assessment Yellow;Red    % Wound base Red or Granulating 95%    % Wound base Yellow/Fibrinous Exudate 5%    Wound Length (cm) 4 cm    Wound Width (cm) 3.8 cm    Wound Surface Area (cm^2) 15.2 cm^2    Drainage Amount Moderate    Drainage Description Serous    Treatment Cleansed;Debridement (Selective)    Wound Properties Date First Assessed: 12/08/20 Time First Assessed: 1635 Wound Type: Venous stasis ulcer Location: Knee Location Orientation: Anterior;Right Wound Description (Comments): Rt medial knee blister Present on Admission: No   Wound Length (cm) --   area is healed except for  3 small areas within the new granulation   Selective Debridement - Location B LE    Selective Debridement - Tools Used Forceps;Scalpel    Selective Debridement - Tissue Removed devitalized tissue    Wound Therapy - Clinical Statement Wounds have increased drainage with increased slough.  Therapist thouroughly cleansed and moisturized LE.  LE are not ready for compression dressing at this time.  Added Silver to dressing as wounds have increase drainage and slight odor.    Wound Therapy - Functional Problem List walking, bathing, dressing    Factors Delaying/Impairing Wound Healing Multiple medical problems    Wound Therapy - Frequency 2X / week    Wound Therapy - Current Recommendations PT    Wound Plan debride as indicated, redress.  measure weekly (thursday or friday)  F/U with new wound on medial Lt knee and begin  training with stockings once wounds are healed.    Dressing  Vaseline to B LE, silver to draining wounds, xeroform to granulated wounds.  Profore lite B with extra cotton needed B to ensure conial shape.                       PT Short Term Goals - 12/15/20 1431       PT SHORT TERM GOAL #1   Title wounds on right will be 100% granulated  Time 3    Period Weeks    Status On-going    Target Date 12/01/20      PT SHORT TERM GOAL #2   Title wound on left will be 100% granulated    Time 3    Period Weeks    Status On-going    Target Date 12/01/20      PT SHORT TERM GOAL #3   Title Patient will have < 3 wounds on right leg    Baseline 4 - one new    Time 3    Period Weeks    Status On-going    Target Date 12/01/20               PT Long Term Goals - 12/15/20 1431       PT LONG TERM GOAL #1   Title wounds on right will be completely healed    Time 6    Period Weeks    Status Not Met    Target Date 12/22/20      PT LONG TERM GOAL #2   Title wounds on left will be completely healed    Time 6    Period Weeks    Status On-going    Target Date 12/22/20      PT LONG TERM GOAL #3   Title Patietn will report importance of wearing compression garments daily    Time 6    Period Weeks    Status On-going    Target Date 12/22/20                   Plan - 12/27/20 1645     Clinical Impression Statement see above    Personal Factors and Comorbidities Comorbidity 1;Comorbidity 2;Comorbidity 3+    Comorbidities COPD, CHF, EF 25%    Examination-Activity Limitations Stairs;Bed Mobility;Locomotion Level;Hygiene/Grooming    Stability/Clinical Decision Making Evolving/Moderate complexity    Rehab Potential Fair    PT Frequency 2x / week    PT Duration 6 weeks    PT Treatment/Interventions ADLs/Self Care Home Management;Therapeutic exercise;Manual techniques;Other (comment)   wound care and modalities as needed   PT Home Exercise Plan ankle pumps     Consulted and Agree with Plan of Care Patient             Patient will benefit from skilled therapeutic intervention in order to improve the following deficits and impairments:  Pain, Difficulty walking, Decreased skin integrity, Increased edema, Decreased scar mobility, Decreased knowledge of precautions  Visit Diagnosis: Wound of left lower extremity, initial encounter  Difficulty in walking, not elsewhere classified  Multiple open wounds of right lower leg     Problem List Patient Active Problem List   Diagnosis Date Noted   Acute on chronic systolic CHF (congestive heart failure) ([place]) 10/28/2020   Noncompliance 12/16/2017   Elevated troponin 12/16/2017   Bronchitis 12/16/2017   COPD with acute exacerbation ([place]) 12/15/2017   Lipoma of extremity    Secondary cardiomyopathy ([place]) 10/14/2012   Atypical chest pain 10/13/2012   Chest pain 10/12/2012   Abnormal EKG 10/12/2012   GERD (gastroesophageal reflux disease) 10/12/2012   UNSPECIFIED ANEMIA 05/18/2008   ERECTILE DYSFUNCTION 09/25/2007   UNS PULMONARY TB-CULT DX 06/19/2007   UNDERWEIGHT 06/19/2007   OSTEOARTHRITIS, HIP, RIGHT 02/18/2006   HEPATITIS C 01/08/2006   DISORDER, TOBACCO USE 01/08/2006   MIGRAINE HEADACHE 01/08/2006   ALLERGIC RHINITIS 01/08/2006   GERD 01/08/2006   BPH (benign prostatic hyperplasia) 01/08/2006   OSTEOARTHRITIS 01/08/2006  Rayetta Humphrey, PT CLT [id number]  12/27/2020, 4:46 PM  [facility] [address] [place], [place], [address] Phone: [phone]   Fax:  [phone]  Name: James Cervantes MRN: [id number] Date of Birth: 1950-03-11

## 2020-12-28 ENCOUNTER — Ambulatory Visit (HOSPITAL_COMMUNITY): Payer: Medicare Other

## 2020-12-29 ENCOUNTER — Encounter (HOSPITAL_COMMUNITY): Payer: Self-pay | Admitting: Physical Therapy

## 2020-12-29 ENCOUNTER — Other Ambulatory Visit: Payer: Self-pay

## 2020-12-29 ENCOUNTER — Ambulatory Visit (HOSPITAL_COMMUNITY): Payer: Medicare Other | Admitting: Physical Therapy

## 2020-12-29 DIAGNOSIS — S81802A Unspecified open wound, left lower leg, initial encounter: Secondary | ICD-10-CM

## 2020-12-29 DIAGNOSIS — R262 Difficulty in walking, not elsewhere classified: Secondary | ICD-10-CM

## 2020-12-29 DIAGNOSIS — S81801A Unspecified open wound, right lower leg, initial encounter: Secondary | ICD-10-CM

## 2020-12-29 NOTE — Therapy (Signed)
[place], [place], [address] Phone: [phone]   Fax:  [phone]  Wound Care Therapy  Patient Details  Name: James Cervantes MRN: [id number] Date of Birth: 1950/10/09 No data recorded  Encounter Date: 12/29/2020   PT End of Session - 12/29/20 0957     Visit Number 14    Number of Visits 24    Date for PT Re-Evaluation 01/26/21    Authorization Type medicare primary and medicaid secondary    Progress Note Due on Visit 20    PT Start Time 1000    PT Stop Time 1056    PT Time Calculation (min) 56 min    Activity Tolerance Patient tolerated treatment well    Behavior During Therapy [facility] for tasks assessed/performed             Past Medical History:  Diagnosis Date   Anemia    Arthritis    Asthma    Cardiomyopathy ([place])    a. 2014: EF 40-45%, diffuse HK, and Grade 2 DD b. EF at 20-25% by echo in 04/2017 and 05/2020   Dyspnea    Emphysema    GERD (gastroesophageal reflux disease)    Hip pain    Hypertension    Peripheral vascular disease ([place])    Prostate disorder     Past Surgical History:  Procedure Laterality Date   ABDOMINAL SURGERY     CATARACT EXTRACTION W/PHACO Left 11/26/2016   Procedure: CATARACT EXTRACTION PHACO AND INTRAOCULAR LENS PLACEMENT LEFT EYE;  Surgeon: Tonny Branch, MD;  Location: [facility];  Service: Ophthalmology;  Laterality: Left;  CDE: 26.53    CATARACT EXTRACTION W/PHACO Right 01/28/2017   Procedure: CATARACT EXTRACTION PHACO AND INTRAOCULAR LENS PLACEMENT RIGHT EYE;  Surgeon: Tonny Branch, MD;  Location: [facility];  Service: Ophthalmology;  Laterality: Right;  CDE: 6.79   HERNIA REPAIR     LUNG SURGERY     MASS EXCISION Right 11/01/2017   Procedure: EXCISION CYST RIGHT KNEE;  Surgeon: Aviva Signs, MD;  Location: [facility];  Service: General;  Laterality: Right;   VASCULAR SURGERY      There were no vitals filed for this visit.               Wound Therapy - 12/29/20  0001     Subjective Dressings slid down below knee wounds    Patient and Family Stated Goals to have wounds heal    Date of Onset --   > month   Prior Treatments none - comes with xeroform and gauze on them    Pain Score 0-No pain    Evaluation and Treatment Procedures Explained to Patient/Family Yes    Evaluation and Treatment Procedures agreed to    Wound Properties Date First Assessed: 12/27/20 Time First Assessed: 1330 Location Orientation: Right Wound Description (Comments): Most inferior wound   Wound Image Images linked: 1    Site / Wound Assessment Red    Treatment Cleansed;Debridement (Selective)    Wound Properties Date First Assessed: 12/22/20 Location: Pretibial Location Orientation: Right;Medial Wound Description (Comments): inferior medial Rt knee wound   Dressing Type Compression wrap    Dressing Changed Changed    Dressing Change Frequency PRN    % Wound base Red or Granulating 100%    Drainage Amount Minimal    Drainage Description Serosanguineous    Treatment Cleansed;Debridement (Selective)    Wound Properties Date First Assessed: 12/22/20 Location: Pretibial Location Orientation: Left;Medial Wound  Description (Comments): superior to inferior blister Lt medial knee   Dressing Type Compression wrap    Dressing Changed Changed    Dressing Status Old drainage    Dressing Change Frequency PRN    Site / Wound Assessment Yellow;Pink    % Wound base Red or Granulating 95%    % Wound base Yellow/Fibrinous Exudate 5%    Drainage Amount Minimal    Drainage Description Serous    Treatment Cleansed;Debridement (Selective)    Wound Properties Date First Assessed: 12/13/20 Time First Assessed: 0400 Location: Pretibial Location Orientation: Left;Proximal Wound Description (Comments): Lt medial knee inferior to initial knee blister   Wound Image Images linked: 1    Dressing Type Compression wrap    Dressing Changed Changed    Dressing Status Old drainage    Dressing Change  Frequency PRN    Site / Wound Assessment Yellow;Pink;Red    % Wound base Red or Granulating 95%    % Wound base Yellow/Fibrinous Exudate 5%    Drainage Amount Moderate    Drainage Description Serous    Treatment Cleansed;Debridement (Selective)    Selective Debridement - Location B LE    Selective Debridement - Tools Used Forceps    Selective Debridement - Tissue Removed devitalized tissue    Wound Therapy - Clinical Statement Patient with bilateral dressings slid down below knee wounds. Patient with continued slough covering all wounds at beginning of session but he tolerates debridement well and most slough able to be removed. Wounds are mostly granulated, pink tissue. Continued with xeroform to all wounds besides silver dressing to L medial knee wound due to continued drainage. Continued with profore lite and wrapped over knees today. Wounds appear to be healing well.    Wound Therapy - Functional Problem List walking, bathing, dressing    Factors Delaying/Impairing Wound Healing Multiple medical problems    Wound Therapy - Frequency 2X / week    Wound Therapy - Current Recommendations PT    Wound Plan debride as indicated, redress.  measure weekly (thursday or friday)  F/U with new wound on medial Lt knee and begin training with stockings once wounds are healed.    Dressing  Vaseline to B LE, silver to draining wounds, xeroform to granulated wounds.  Profore lite B with extra cotton needed B to ensure conial shape.                       PT Short Term Goals - 12/15/20 1431       PT SHORT TERM GOAL #1   Title wounds on right will be 100% granulated    Time 3    Period Weeks    Status On-going    Target Date 12/01/20      PT SHORT TERM GOAL #2   Title wound on left will be 100% granulated    Time 3    Period Weeks    Status On-going    Target Date 12/01/20      PT SHORT TERM GOAL #3   Title Patient will have < 3 wounds on right leg    Baseline 4 - one new    Time  3    Period Weeks    Status On-going    Target Date 12/01/20               PT Long Term Goals - 12/15/20 1431       PT LONG TERM GOAL #1   Title wounds  on right will be completely healed    Time 6    Period Weeks    Status Not Met    Target Date 12/22/20      PT LONG TERM GOAL #2   Title wounds on left will be completely healed    Time 6    Period Weeks    Status On-going    Target Date 12/22/20      PT LONG TERM GOAL #3   Title Patietn will report importance of wearing compression garments daily    Time 6    Period Weeks    Status On-going    Target Date 12/22/20                   Plan - 12/29/20 0957     Clinical Impression Statement see above    Personal Factors and Comorbidities Comorbidity 1;Comorbidity 2;Comorbidity 3+    Comorbidities COPD, CHF, EF 25%    Examination-Activity Limitations Stairs;Bed Mobility;Locomotion Level;Hygiene/Grooming    Stability/Clinical Decision Making Evolving/Moderate complexity    Rehab Potential Fair    PT Frequency 2x / week    PT Duration 6 weeks    PT Treatment/Interventions ADLs/Self Care Home Management;Therapeutic exercise;Manual techniques;Other (comment)   wound care and modalities as needed   PT Home Exercise Plan ankle pumps    Consulted and Agree with Plan of Care Patient             Patient will benefit from skilled therapeutic intervention in order to improve the following deficits and impairments:  Pain, Difficulty walking, Decreased skin integrity, Increased edema, Decreased scar mobility, Decreased knowledge of precautions  Visit Diagnosis: Wound of left lower extremity, initial encounter  Difficulty in walking, not elsewhere classified  Multiple open wounds of right lower leg     Problem List Patient Active Problem List   Diagnosis Date Noted   Acute on chronic systolic CHF (congestive heart failure) ([place]) 10/28/2020   Noncompliance 12/16/2017   Elevated troponin 12/16/2017    Bronchitis 12/16/2017   COPD with acute exacerbation ([place]) 12/15/2017   Lipoma of extremity    Secondary cardiomyopathy ([place]) 10/14/2012   Atypical chest pain 10/13/2012   Chest pain 10/12/2012   Abnormal EKG 10/12/2012   GERD (gastroesophageal reflux disease) 10/12/2012   UNSPECIFIED ANEMIA 05/18/2008   ERECTILE DYSFUNCTION 09/25/2007   UNS PULMONARY TB-CULT DX 06/19/2007   UNDERWEIGHT 06/19/2007   OSTEOARTHRITIS, HIP, RIGHT 02/18/2006   HEPATITIS C 01/08/2006   DISORDER, TOBACCO USE 01/08/2006   MIGRAINE HEADACHE 01/08/2006   ALLERGIC RHINITIS 01/08/2006   GERD 01/08/2006   BPH (benign prostatic hyperplasia) 01/08/2006   OSTEOARTHRITIS 01/08/2006    11:06 AM, 12/29/20 Mearl Latin PT, DPT Physical Therapist at [place], [place], [address] Phone: [phone]   Fax:  [phone]  Name: James Cervantes MRN: [id number] Date of Birth: 27-Nov-1950

## 2020-12-30 ENCOUNTER — Ambulatory Visit (HOSPITAL_COMMUNITY): Payer: Medicare Other

## 2020-12-31 ENCOUNTER — Emergency Department (HOSPITAL_COMMUNITY)
Admission: EM | Admit: 2020-12-31 | Discharge: 2020-12-31 | Disposition: A | Discharge status: Home / Self Care | Payer: Medicare Other | Attending: Emergency Medicine | Admitting: Emergency Medicine

## 2020-12-31 ENCOUNTER — Encounter (HOSPITAL_COMMUNITY): Payer: Self-pay | Admitting: *Deleted

## 2020-12-31 ENCOUNTER — Other Ambulatory Visit: Payer: Self-pay

## 2020-12-31 ENCOUNTER — Emergency Department (HOSPITAL_COMMUNITY): Payer: Medicare Other

## 2020-12-31 DIAGNOSIS — J449 Chronic obstructive pulmonary disease, unspecified: Secondary | ICD-10-CM | POA: Diagnosis not present

## 2020-12-31 DIAGNOSIS — I5023 Acute on chronic systolic (congestive) heart failure: Secondary | ICD-10-CM | POA: Insufficient documentation

## 2020-12-31 DIAGNOSIS — R609 Edema, unspecified: Secondary | ICD-10-CM

## 2020-12-31 DIAGNOSIS — R2240 Localized swelling, mass and lump, unspecified lower limb: Secondary | ICD-10-CM | POA: Diagnosis present

## 2020-12-31 DIAGNOSIS — I110 Hypertensive heart disease with heart failure: Secondary | ICD-10-CM | POA: Insufficient documentation

## 2020-12-31 DIAGNOSIS — F17210 Nicotine dependence, cigarettes, uncomplicated: Secondary | ICD-10-CM | POA: Diagnosis not present

## 2020-12-31 DIAGNOSIS — Z79899 Other long term (current) drug therapy: Secondary | ICD-10-CM | POA: Diagnosis not present

## 2020-12-31 DIAGNOSIS — I509 Heart failure, unspecified: Secondary | ICD-10-CM

## 2020-12-31 DIAGNOSIS — J45909 Unspecified asthma, uncomplicated: Secondary | ICD-10-CM | POA: Insufficient documentation

## 2020-12-31 DIAGNOSIS — Z7982 Long term (current) use of aspirin: Secondary | ICD-10-CM | POA: Insufficient documentation

## 2020-12-31 DIAGNOSIS — R600 Localized edema: Secondary | ICD-10-CM | POA: Insufficient documentation

## 2020-12-31 LAB — COMPREHENSIVE METABOLIC PANEL
ALT: 11 U/L (ref 0–44)
AST: 26 U/L (ref 15–41)
Albumin: 3.2 g/dL — ABNORMAL LOW (ref 3.5–5.0)
Alkaline Phosphatase: 78 U/L (ref 38–126)
Anion gap: 9 (ref 5–15)
BUN: 24 mg/dL — ABNORMAL HIGH (ref 8–23)
CO2: 29 mmol/L (ref 22–32)
Calcium: 8.6 mg/dL — ABNORMAL LOW (ref 8.9–10.3)
Chloride: 99 mmol/L (ref 98–111)
Creatinine, Ser: 1.53 mg/dL — ABNORMAL HIGH (ref 0.61–1.24)
GFR, Estimated: 49 mL/min — ABNORMAL LOW (ref >60)
Glucose, Bld: 65 mg/dL — ABNORMAL LOW (ref 70–99)
Potassium: 5.2 mmol/L — ABNORMAL HIGH (ref 3.5–5.1)
Sodium: 137 mmol/L (ref 135–145)
Total Bilirubin: 0.5 mg/dL (ref 0.3–1.2)
Total Protein: 7.1 g/dL (ref 6.5–8.1)

## 2020-12-31 LAB — CBC WITH DIFFERENTIAL/PLATELET
Abs Immature Granulocytes: 0.01 10*3/uL (ref 0.00–0.07)
Basophils Absolute: 0 10*3/uL (ref 0.0–0.1)
Basophils Relative: 1 %
Eosinophils Absolute: 0.1 10*3/uL (ref 0.0–0.5)
Eosinophils Relative: 1 %
HCT: 34.6 % — ABNORMAL LOW (ref 39.0–52.0)
Hemoglobin: 10.6 g/dL — ABNORMAL LOW (ref 13.0–17.0)
Immature Granulocytes: 0 %
Lymphocytes Relative: 21 %
Lymphs Abs: 1 10*3/uL (ref 0.7–4.0)
MCH: 25.9 pg — ABNORMAL LOW (ref 26.0–34.0)
MCHC: 30.6 g/dL (ref 30.0–36.0)
MCV: 84.6 fL (ref 80.0–100.0)
Monocytes Absolute: 0.6 10*3/uL (ref 0.1–1.0)
Monocytes Relative: 12 %
Neutro Abs: 3.2 10*3/uL (ref 1.7–7.7)
Neutrophils Relative %: 65 %
Platelets: 354 10*3/uL (ref 150–400)
RBC: 4.09 MIL/uL — ABNORMAL LOW (ref 4.22–5.81)
RDW: 17.2 % — ABNORMAL HIGH (ref 11.5–15.5)
WBC: 5 10*3/uL (ref 4.0–10.5)
nRBC: 0 % (ref 0.0–0.2)

## 2020-12-31 LAB — BRAIN NATRIURETIC PEPTIDE: B Natriuretic Peptide: 2977 pg/mL — ABNORMAL HIGH (ref 0.0–100.0)

## 2020-12-31 LAB — TROPONIN I (HIGH SENSITIVITY): Troponin I (High Sensitivity): 15 ng/L (ref <18)

## 2020-12-31 LAB — PROTIME-INR
INR: 1.1 (ref 0.8–1.2)
Prothrombin Time: 14.7 seconds (ref 11.4–15.2)

## 2020-12-31 MED ORDER — FUROSEMIDE 40 MG PO TABS: 40.0000 mg | ORAL_TABLET | Freq: Two times a day (BID) | ORAL | 0 refills | Status: AC

## 2020-12-31 MED ORDER — FUROSEMIDE 10 MG/ML IJ SOLN
80.0000 mg | INTRAMUSCULAR | Status: AC
  Administered 2020-12-31: 80 mg via INTRAVENOUS
  Filled 2020-12-31: qty 8

## 2020-12-31 NOTE — ED Notes (Signed)
Pt refused to sign for discharge papers, and refused repeat VS. Pt upset about being discharged from hospital.

## 2020-12-31 NOTE — ED Notes (Signed)
Pt rude to staff during the triage questions.

## 2020-12-31 NOTE — ED Provider Notes (Signed)
[facility] EMERGENCY DEPARTMENT Provider Note   CSN: [id number] Arrival date & time: 12/31/20  1431     History Chief Complaint  Patient presents with   Leg Swelling    James Cervantes is a 70 y.o. male.  HPI  70 year old male with a history of asthma, he has a cardiomyopathy with an ejection fraction of 20 to 25% as of May 2022.  He has peripheral vascular disease and is currently at the wound care center getting bilateral lower extremity dressings.  He goes twice a week.  He presents to the hospital with multiple complaints including exertional dyspnea which is been going on for quite some time, he has having frequent diarrhea, he has significant shortness of breath which occurs when he exerts himself, he denies chest pain or fevers, denies coughing, denies abdominal pain, denies significant headache.  Sx are persistent and gradually getting worse.   Past Medical History:  Diagnosis Date   Anemia    Arthritis    Asthma    Cardiomyopathy ([place])    a. 2014: EF 40-45%, diffuse HK, and Grade 2 DD b. 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   Atypical chest pain 10/13/2012   Chest pain 10/12/2012   Abnormal EKG 10/12/2012   GERD (gastroesophageal reflux disease) 10/12/2012   UNSPECIFIED ANEMIA 05/18/2008   ERECTILE DYSFUNCTION 09/25/2007   [facility] PULMONARY TB-CULT DX 06/19/2007   UNDERWEIGHT 06/19/2007   OSTEOARTHRITIS, HIP, RIGHT 02/18/2006   HEPATITIS C 01/08/2006   DISORDER, TOBACCO USE  01/08/2006   MIGRAINE HEADACHE 01/08/2006   ALLERGIC RHINITIS 01/08/2006   GERD 01/08/2006   BPH (benign prostatic hyperplasia) 01/08/2006   OSTEOARTHRITIS 01/08/2006    Past Surgical History:  Procedure Laterality Date   ABDOMINAL SURGERY     CATARACT EXTRACTION W/PHACO Left 11/26/2016   Procedure: CATARACT EXTRACTION PHACO AND INTRAOCULAR LENS PLACEMENT LEFT EYE;  Surgeon: Tonny Branch, MD;  Location: [facility];  Service: Ophthalmology;  Laterality: Left;  CDE: 26.53    CATARACT EXTRACTION W/PHACO Right 01/28/2017   Procedure: CATARACT EXTRACTION PHACO AND INTRAOCULAR LENS PLACEMENT RIGHT EYE;  Surgeon: Tonny Branch, MD;  Location: [facility];  Service: Ophthalmology;  Laterality: Right;  CDE: 6.79   HERNIA REPAIR     LUNG SURGERY     MASS EXCISION Right 11/01/2017   Procedure: EXCISION CYST RIGHT KNEE;  Surgeon: Aviva Signs, MD;  Location: [facility];  Service: General;  Laterality: Right;   VASCULAR SURGERY         Family History  Problem Relation Age of Onset   Cancer Mother    Hypertension Mother    CVA Brother     Social History   Tobacco Use   Smoking status: Every Day    Packs/day: 0.50    Years: 50.00    Pack years: 25.00    Types: Cigarettes   Smokeless tobacco: Never  Vaping Use   Vaping Use: Never used  Substance Use  Topics   Alcohol use: No   Drug use: No    Home Medications Prior to Admission medications   Medication Sig Start Date End Date Taking? Authorizing Provider  albuterol (PROAIR HFA) 108 (90 Base) MCG/ACT inhaler Inhale 2 puffs into the lungs every 4 (four) hours as needed for wheezing or shortness of breath. 12/16/17  Yes Johnson, Clanford L, MD  ANORO ELLIPTA 62.5-25 MCG/INH AEPB Inhale 1 puff into the lungs daily. 02/17/20  Yes [provider]  aspirin 81 MG chewable tablet Chew 81 mg by mouth daily.   Yes [provider]  cholecalciferol (VITAMIN D) 25 MCG (1000 UNIT) tablet Take 1,000 Units by mouth daily.   Yes [provider]  losartan (COZAAR) 25 MG tablet Take 1 tablet (25 mg total) by mouth daily. 12/06/20 03/06/21 Yes Strader, Fransisco Hertz, PA-C  pantoprazole (PROTONIX) 40 MG tablet Take 40 mg by mouth daily. 02/22/20  Yes [provider]  potassium chloride SA (KLOR-CON M) 20 MEQ tablet Take 20 mEq by mouth daily. 12/03/20  Yes [provider]  sildenafil (VIAGRA) 100 MG tablet Take 1 tablet (100 mg total) by mouth daily as needed. 09/27/20  Yes Dahlstedt, Annie Main, MD  spironolactone (ALDACTONE) 25 MG tablet Take 1 tablet by mouth daily. 04/28/20  Yes [provider]  furosemide (LASIX) 40 MG tablet Take 1 tablet (40 mg total) by mouth 2 (two) times daily for 7 days. 12/31/20 01/07/21  Noemi Chapel, MD  metoprolol succinate (TOPROL-XL) 25 MG 24 hr tablet Take 12.5 mg by mouth daily. Patient not taking: Reported on 12/31/2020    [provider]  oxyCODONE (OXY IR/ROXICODONE) 5 MG immediate release tablet Take 5 mg by mouth at bedtime.  Patient not taking: Reported on 12/06/2020    [provider]    Allergies    Patient has no known allergies.  Review of Systems   Review of Systems  All other systems reviewed and are negative.  Physical Exam Updated Vital Signs BP 114/90    Pulse 91    Temp 97.7 F (36.5 C) (Oral)    Resp (!) 22    Ht 1.676 m (5\' 6" )    Wt 62.1 kg    SpO2 96%    BMI 22.10 kg/m   Physical Exam Vitals and nursing note reviewed.  Constitutional:      General: He is not in acute distress.    Appearance: He is well-developed.  HENT:     Head: Normocephalic and atraumatic.     Mouth/Throat:     Pharynx: No oropharyngeal exudate.  Eyes:     General: No scleral icterus.       Right eye: No discharge.        Left eye: No discharge.     Conjunctiva/sclera: Conjunctivae normal.     Pupils: Pupils are equal, round, and reactive to light.  Neck:     Thyroid: No thyromegaly.     Vascular: No JVD.     Comments: JVD present  bilaterally Cardiovascular:     Rate and Rhythm: Normal rate and regular rhythm.     Heart sounds: Normal heart sounds. No murmur heard.   No friction rub. No gallop.  Pulmonary:     Effort: Pulmonary effort is normal. No respiratory distress.     Breath sounds: Normal breath sounds. No wheezing or rales.  Abdominal:     General: Bowel sounds are normal. There is no distension.     Palpations: Abdomen  is soft. There is no mass.     Tenderness: There is no abdominal tenderness.  Musculoskeletal:        General: No tenderness. Normal range of motion.     Cervical back: Normal range of motion and neck supple.     Right lower leg: Edema present.     Left lower leg: Edema present.  Lymphadenopathy:     Cervical: No cervical adenopathy.  Skin:    General: Skin is warm and dry.     Findings: No erythema or rash.  Neurological:     Mental Status: He is alert.     Coordination: Coordination normal.  Psychiatric:        Behavior: Behavior normal.    ED Results / Procedures / Treatments   Labs (all labs ordered are listed, but only abnormal results are displayed) Labs Reviewed  COMPREHENSIVE METABOLIC PANEL - Abnormal; Notable for the following components:      Result Value   Potassium 5.2 (*)    Glucose, Bld 65 (*)    BUN 24 (*)    Creatinine, Ser 1.53 (*)    Calcium 8.6 (*)    Albumin 3.2 (*)    GFR, Estimated 49 (*)    All other components within normal limits  BRAIN NATRIURETIC PEPTIDE - Abnormal; Notable for the following components:   B Natriuretic Peptide 2,977.0 (*)    All other components within normal limits  CBC WITH DIFFERENTIAL/PLATELET - Abnormal; Notable for the following components:   RBC 4.09 (*)    Hemoglobin 10.6 (*)    HCT 34.6 (*)    MCH 25.9 (*)    RDW 17.2 (*)    All other components within normal limits  PROTIME-INR  TROPONIN I (HIGH SENSITIVITY)    EKG None  Radiology DG Chest Port 1 View  Result Date: 12/31/2020 CLINICAL DATA:  Dyspnea on  exertion. EXAM: PORTABLE CHEST 1 VIEW COMPARISON:  October 28, 2020. FINDINGS: Stable cardiomegaly. Stable left basilar opacity is noted concerning for atelectasis with possible associated pleural effusion. Mediastinal shift to the left is noted. Right lung is clear. Stable scoliosis of thoracic spine is noted. IMPRESSION: Stable left basilar opacity as described above. Electronically Signed   By: Marijo Conception M.D.   On: 12/31/2020 16:53    Procedures Procedures   Medications Ordered in ED Medications  furosemide (LASIX) injection 80 mg (80 mg Intravenous Given 12/31/20 1848)    ED Course  I have reviewed the triage vital signs and the nursing notes.  Pertinent labs & imaging results that were available during my care of the patient were reviewed by me and considered in my medical decision making (see chart for details).    MDM Rules/Calculators/A&P                          This patient presents to the ED for concern of dyspnea on exertion and leg swelling, this involves an extensive number of treatment options, and is a complaint that carries with it a high risk of complications and morbidity.  The differential diagnosis includes worsening congestive heart failure, heart failure exacerbation, acute ischemia   Co morbidities that complicate the patient evaluation  The patient has hypertension, he has known congestive heart failure   Additional history obtained:  Additional history obtained from electronic medical record External records from outside source obtained and reviewed including prior echocardiograms showing low ejection fraction   Lab Tests:  I  Ordered, and personally interpreted labs.  The pertinent results include: BNP is elevated to 4270, metabolic panel rather unremarkable except for creatinine of 1.5 which is slightly above normal   Imaging Studies ordered:  I ordered imaging studies including portable chest x-ray I independently visualized and interpreted  imaging which showed no acute pulmonary edema I agree with the radiologist interpretation   Cardiac Monitoring:  The patient was maintained on a cardiac monitor.  I personally viewed and interpreted the cardiac monitored which showed an underlying rhythm of: Normal sinus rhythm   Medicines ordered and prescription drug management:  I ordered medication including intravenous Lasix for some fluid overload Reevaluation of the patient after these medicines showed that the patient improved I have reviewed the patients home medicines and have made adjustments as needed   Test Considered:  CT scan of the chest though it does not seem that he has a pulmonary embolism   Critical Interventions:  Evaluation at the bedside for congestive heart failure Chest x-ray and labs to make sure he is not in florid failure Ambulating in the hallways, he does not become hypoxic in fact his oxygen stays at 97% and he is not visibly dyspneic   Consultations Obtained:  I requested consultation with the hospitalist, Dr. Sheran Lawless,  and discussed lab and imaging findings as well as pertinent plan - they recommend: IV diuretic, likely can be discharged given that he is in no distress he is not hypoxic and does not appear to be fluid overloaded on chest x-ray   Problem List / ED Course:  Fluid overload and congestive heart failure, given intravenous Lasix, ambulated without hypoxia, very well-appearing IV Lasix given, started to diurese, patient agreeable to return, understands indications, vital signs very stable  Vitals:   12/31/20 1452 12/31/20 1700  BP: 116/84 114/90  Pulse: 94 91  Resp: 18 (!) 22  Temp: 97.7 F (36.5 C)   SpO2: 96% 96%      Reevaluation:  After the interventions noted above, I reevaluated the patient and found that they have :improved   Social Determinants of Health:  Has good access to health care and sees wound care clinic Wounds redressed prior to  d/c.   Dispostion:  After consideration of the diagnostic results and the patients response to treatment, I feel that the patent would benefit from discharge.       Final Clinical Impression(s) / ED Diagnoses Final diagnoses:  Peripheral edema  Chronic congestive heart failure, unspecified heart failure type ([place])    Rx / DC Orders ED Discharge Orders          Ordered    furosemide (LASIX) 40 MG tablet  2 times daily        12/31/20 1938             Noemi Chapel, MD 12/31/20 1940

## 2020-12-31 NOTE — ED Notes (Signed)
Pt ambulated around nurses station with steady gait, oxygen levels 97% heart rate at 95 bpm. No respiratory distress. No complaints other than wanting water, coffee and food.

## 2020-12-31 NOTE — ED Notes (Signed)
Pt continues to refuse lab work

## 2020-12-31 NOTE — Discharge Instructions (Addendum)
Please increase the dose of your Lasix to 40 mg twice a day for the next week then go back to once a day.  You will need to have your family doctor recheck your blood work within 1 week, I have given you a prescription that will allow you to have the extra medication.  The rest of your testing has been very reassuring and there is no signs of fluid in your lungs at this time.  Please return to the emergency department immediately for severe worsening symptom

## 2020-12-31 NOTE — ED Triage Notes (Signed)
Pt states he has been sick for 6 months, when asked what are his symptoms, pt states swelling to bilateral legs.

## 2020-12-31 NOTE — ED Notes (Signed)
Pt allowed this nurse to start an IV and draw blood. IV site only gave minimal blood return. EDP aware. Pt refusing to be stuck again by lab.

## 2021-01-03 ENCOUNTER — Ambulatory Visit (HOSPITAL_COMMUNITY): Payer: Medicare Other | Admitting: Physical Therapy

## 2021-01-04 ENCOUNTER — Ambulatory Visit (HOSPITAL_COMMUNITY): Payer: Medicare Other | Attending: Family Medicine

## 2021-01-04 ENCOUNTER — Telehealth (HOSPITAL_COMMUNITY): Payer: Self-pay

## 2021-01-04 DIAGNOSIS — S81802A Unspecified open wound, left lower leg, initial encounter: Secondary | ICD-10-CM | POA: Insufficient documentation

## 2021-01-04 DIAGNOSIS — S81801A Unspecified open wound, right lower leg, initial encounter: Secondary | ICD-10-CM | POA: Insufficient documentation

## 2021-01-04 DIAGNOSIS — R262 Difficulty in walking, not elsewhere classified: Secondary | ICD-10-CM | POA: Insufficient documentation

## 2021-01-04 NOTE — Telephone Encounter (Signed)
No show, called and spoke to pt who thought he apt was schedulded for 2:00.  Reminded next apt date and time.  Pt requested to be seen today, will add to wait list if have openings.  Ihor Austin, LPTA/CLT; Delana Meyer [phone]

## 2021-01-05 ENCOUNTER — Ambulatory Visit (HOSPITAL_COMMUNITY): Payer: Medicare Other | Admitting: Physical Therapy

## 2021-01-05 ENCOUNTER — Encounter (HOSPITAL_COMMUNITY): Payer: Self-pay | Admitting: Physical Therapy

## 2021-01-05 ENCOUNTER — Other Ambulatory Visit: Payer: Self-pay

## 2021-01-05 DIAGNOSIS — S81801A Unspecified open wound, right lower leg, initial encounter: Secondary | ICD-10-CM

## 2021-01-05 DIAGNOSIS — S81802A Unspecified open wound, left lower leg, initial encounter: Secondary | ICD-10-CM

## 2021-01-05 DIAGNOSIS — R262 Difficulty in walking, not elsewhere classified: Secondary | ICD-10-CM

## 2021-01-05 NOTE — Therapy (Signed)
[place], [place], [address] Phone: [phone]   Fax:  [phone]  Wound Care Therapy  Patient Details  Name: James Cervantes MRN: [id number] Date of Birth: 06/18/1950 No data recorded  Encounter Date: 01/05/2021   PT End of Session - 01/05/21 1036     Visit Number 15    Number of Visits 24    Date for PT Re-Evaluation 01/26/21    Authorization Type medicare primary and medicaid secondary    Progress Note Due on Visit 20    PT Start Time 1040    PT Stop Time 1136    PT Time Calculation (min) 56 min    Activity Tolerance Patient tolerated treatment well    Behavior During Therapy [facility] for tasks assessed/performed             Past Medical History:  Diagnosis Date   Anemia    Arthritis    Asthma    Cardiomyopathy ([place])    a. 2014: EF 40-45%, diffuse HK, and Grade 2 DD b. EF at 20-25% by echo in 04/2017 and 05/2020   Dyspnea    Emphysema    GERD (gastroesophageal reflux disease)    Hip pain    Hypertension    Peripheral vascular disease ([place])    Prostate disorder     Past Surgical History:  Procedure Laterality Date   ABDOMINAL SURGERY     CATARACT EXTRACTION W/PHACO Left 11/26/2016   Procedure: CATARACT EXTRACTION PHACO AND INTRAOCULAR LENS PLACEMENT LEFT EYE;  Surgeon: Tonny Branch, MD;  Location: [facility];  Service: Ophthalmology;  Laterality: Left;  CDE: 26.53    CATARACT EXTRACTION W/PHACO Right 01/28/2017   Procedure: CATARACT EXTRACTION PHACO AND INTRAOCULAR LENS PLACEMENT RIGHT EYE;  Surgeon: Tonny Branch, MD;  Location: [facility];  Service: Ophthalmology;  Laterality: Right;  CDE: 6.79   HERNIA REPAIR     LUNG SURGERY     MASS EXCISION Right 11/01/2017   Procedure: EXCISION CYST RIGHT KNEE;  Surgeon: Aviva Signs, MD;  Location: [facility];  Service: General;  Laterality: Right;   VASCULAR SURGERY      There were no vitals filed for this visit.               Wound Therapy - 01/05/21  0001     Subjective Legs havent been hurting and he has been eating better    Patient and Family Stated Goals to have wounds heal    Date of Onset --   > month   Prior Treatments none - comes with xeroform and gauze on them    Pain Score 0-No pain    Evaluation and Treatment Procedures Explained to Patient/Family Yes    Evaluation and Treatment Procedures agreed to    Wound Properties Date First Assessed: 12/27/20 Time First Assessed: 1330 Location Orientation: Right Wound Description (Comments): Most inferior wound   Site / Wound Assessment Red    % Wound base Red or Granulating 100%    Drainage Amount Scant    Drainage Description Serous    Treatment Cleansed;Debridement (Selective)    Wound Properties Date First Assessed: 12/22/20 Location: Pretibial Location Orientation: Right;Medial Wound Description (Comments): inferior medial Rt knee wound   Dressing Type Compression wrap    Dressing Changed Changed    Dressing Change Frequency PRN    % Wound base Red or Granulating 100%    Wound Properties Date First Assessed: 12/22/20 Location: Pretibial Location Orientation: Left;Medial Wound  Description (Comments): superior to inferior blister Lt medial knee   Dressing Type Compression wrap    Dressing Changed Changed    Dressing Status Old drainage    Dressing Change Frequency PRN    Site / Wound Assessment Yellow;Pink;Red    % Wound base Red or Granulating 95%    % Wound base Yellow/Fibrinous Exudate 5%    Drainage Amount Minimal    Drainage Description Serous    Treatment Cleansed;Debridement (Selective)    Wound Properties Date First Assessed: 12/13/20 Time First Assessed: 0400 Location: Pretibial Location Orientation: Left;Proximal Wound Description (Comments): Lt medial knee inferior to initial knee blister   Dressing Type Compression wrap    Dressing Changed Changed    Dressing Status Old drainage    Dressing Change Frequency PRN    Site / Wound Assessment Yellow;Red;Pink    %  Wound base Red or Granulating 100%    Drainage Amount Minimal    Drainage Description Serous    Treatment Cleansed;Debridement (Selective)    Selective Debridement - Location B LE    Selective Debridement - Tools Used Forceps    Selective Debridement - Tissue Removed devitalized tissue    Wound Therapy - Clinical Statement Patient with dressings slid below knee wounds. Distal extremity wounds are very small and are all granulation tissue. Bilateral knee wounds mostly scabbed over and following debridement are healing well with all granulation below. L knee superior wound with some adherent slough remaining and it continues to be most painful. Patient tolerates debridement well. Xeroform to all wounds followed by profore lite.    Wound Therapy - Functional Problem List walking, bathing, dressing    Factors Delaying/Impairing Wound Healing Multiple medical problems    Wound Therapy - Frequency 2X / week    Wound Therapy - Current Recommendations PT    Wound Plan debride as indicated, redress.  measure weekly (thursday or friday) begin training with stockings once wounds are healed.    Dressing  Vaseline to B LE, silver to draining wounds, xeroform to granulated wounds.  Profore lite B with extra cotton needed B to ensure conial shape.                       PT Short Term Goals - 12/15/20 1431       PT SHORT TERM GOAL #1   Title wounds on right will be 100% granulated    Time 3    Period Weeks    Status On-going    Target Date 12/01/20      PT SHORT TERM GOAL #2   Title wound on left will be 100% granulated    Time 3    Period Weeks    Status On-going    Target Date 12/01/20      PT SHORT TERM GOAL #3   Title Patient will have < 3 wounds on right leg    Baseline 4 - one new    Time 3    Period Weeks    Status On-going    Target Date 12/01/20               PT Long Term Goals - 12/15/20 1431       PT LONG TERM GOAL #1   Title wounds on right will be  completely healed    Time 6    Period Weeks    Status Not Met    Target Date 12/22/20      PT LONG TERM  GOAL #2   Title wounds on left will be completely healed    Time 6    Period Weeks    Status On-going    Target Date 12/22/20      PT LONG TERM GOAL #3   Title Patietn will report importance of wearing compression garments daily    Time 6    Period Weeks    Status On-going    Target Date 12/22/20                   Plan - 01/05/21 1037     Clinical Impression Statement see above    Personal Factors and Comorbidities Comorbidity 1;Comorbidity 2;Comorbidity 3+    Comorbidities COPD, CHF, EF 25%    Examination-Activity Limitations Stairs;Bed Mobility;Locomotion Level;Hygiene/Grooming    Stability/Clinical Decision Making Evolving/Moderate complexity    Rehab Potential Fair    PT Frequency 2x / week    PT Duration 6 weeks    PT Treatment/Interventions ADLs/Self Care Home Management;Therapeutic exercise;Manual techniques;Other (comment)   wound care and modalities as needed   PT Home Exercise Plan ankle pumps    Consulted and Agree with Plan of Care Patient             Patient will benefit from skilled therapeutic intervention in order to improve the following deficits and impairments:  Pain, Difficulty walking, Decreased skin integrity, Increased edema, Decreased scar mobility, Decreased knowledge of precautions  Visit Diagnosis: Wound of left lower extremity, initial encounter  Difficulty in walking, not elsewhere classified  Multiple open wounds of right lower leg     Problem List Patient Active Problem List   Diagnosis Date Noted   Acute on chronic systolic CHF (congestive heart failure) ([place]) 10/28/2020   Noncompliance 12/16/2017   Elevated troponin 12/16/2017   Bronchitis 12/16/2017   COPD with acute exacerbation ([place]) 12/15/2017   Lipoma of extremity    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   Atypical chest pain 10/13/2012   Chest pain  10/12/2012   Abnormal EKG 10/12/2012   GERD (gastroesophageal reflux disease) 10/12/2012   UNSPECIFIED ANEMIA 05/18/2008   ERECTILE DYSFUNCTION 09/25/2007   [facility] PULMONARY TB-CULT DX 06/19/2007   UNDERWEIGHT 06/19/2007   OSTEOARTHRITIS, HIP, RIGHT 02/18/2006   HEPATITIS C 01/08/2006   DISORDER, TOBACCO USE 01/08/2006   MIGRAINE HEADACHE 01/08/2006   ALLERGIC RHINITIS 01/08/2006   GERD 01/08/2006   BPH (benign prostatic hyperplasia) 01/08/2006   OSTEOARTHRITIS 01/08/2006    12:51 PM, 01/05/21 Mearl Latin PT, DPT Physical Therapist at [facility] [place], [place], [address] Phone: [phone]   Fax:  [phone]  Name: James Cervantes MRN: [id number] Date of Birth: [birth year]

## 2021-01-09 ENCOUNTER — Other Ambulatory Visit: Payer: Self-pay

## 2021-01-09 ENCOUNTER — Ambulatory Visit (HOSPITAL_COMMUNITY): Payer: Medicare Other | Admitting: Physical Therapy

## 2021-01-09 DIAGNOSIS — S81802A Unspecified open wound, left lower leg, initial encounter: Secondary | ICD-10-CM

## 2021-01-09 DIAGNOSIS — R262 Difficulty in walking, not elsewhere classified: Secondary | ICD-10-CM

## 2021-01-09 DIAGNOSIS — S81801A Unspecified open wound, right lower leg, initial encounter: Secondary | ICD-10-CM

## 2021-01-09 NOTE — Therapy (Signed)
[place], [place], [address] Phone: [phone]   Fax:  [phone]  Wound Care Therapy  Patient Details  Name: James Cervantes MRN: [id number] Date of Birth: 07/28/1950 No data recorded  Encounter Date: 01/09/2021   PT End of Session - 01/09/21 1519     Visit Number 16    Number of Visits 24    Date for PT Re-Evaluation 01/26/21    Authorization Type medicare primary and medicaid secondary    Progress Note Due on Visit 61    PT Start Time 1358    PT Stop Time 7096    PT Time Calculation (min) 44 min    Activity Tolerance Patient tolerated treatment well    Behavior During Therapy [facility] for tasks assessed/performed             Past Medical History:  Diagnosis Date   Anemia    Arthritis    Asthma    Cardiomyopathy ([place])    a. 2014: EF 40-45%, diffuse HK, and Grade 2 DD b. EF at 20-25% by echo in 04/2017 and 05/2020   Dyspnea    Emphysema    GERD (gastroesophageal reflux disease)    Hip pain    Hypertension    Peripheral vascular disease ([place])    Prostate disorder     Past Surgical History:  Procedure Laterality Date   ABDOMINAL SURGERY     CATARACT EXTRACTION W/PHACO Left 11/26/2016   Procedure: CATARACT EXTRACTION PHACO AND INTRAOCULAR LENS PLACEMENT LEFT EYE;  Surgeon: Tonny Branch, MD;  Location: [facility];  Service: Ophthalmology;  Laterality: Left;  CDE: 26.53    CATARACT EXTRACTION W/PHACO Right 01/28/2017   Procedure: CATARACT EXTRACTION PHACO AND INTRAOCULAR LENS PLACEMENT RIGHT EYE;  Surgeon: Tonny Branch, MD;  Location: [facility];  Service: Ophthalmology;  Laterality: Right;  CDE: 6.79   HERNIA REPAIR     LUNG SURGERY     MASS EXCISION Right 11/01/2017   Procedure: EXCISION CYST RIGHT KNEE;  Surgeon: Aviva Signs, MD;  Location: [facility];  Service: General;  Laterality: Right;   VASCULAR SURGERY      There were no vitals filed for this visit.               Wound Therapy - 01/09/21  1503     Subjective pt showed up for appointment, however was not scheduled.  Pt was here last on Thursday of last week.  Reports no pain or issues.    Patient and Family Stated Goals to have wounds heal    Date of Onset --   > month   Prior Treatments none - comes with xeroform and gauze on them    Evaluation and Treatment Procedures Explained to Patient/Family Yes    Evaluation and Treatment Procedures agreed to    Wound Properties Date First Assessed: 12/22/20 Location: Pretibial Location Orientation: Left;Medial Wound Description (Comments): superior to inferior blister Lt medial knee   Dressing Type Compression wrap;Impregnated gauze (bismuth)    Dressing Changed Changed    Dressing Status Old drainage    Dressing Change Frequency PRN    Site / Wound Assessment Red;Pink;Yellow    % Wound base Red or Granulating 95%    % Wound base Yellow/Fibrinous Exudate 5%    Drainage Amount Minimal    Drainage Description Serous    Treatment Cleansed;Debridement (Selective)    Wound Properties Date First Assessed: 12/22/20 Location: Pretibial Location Orientation: Right;Medial Wound Description (Comments): inferior  medial Rt knee wound   Dressing Type Compression wrap;Impregnated gauze (bismuth)    Dressing Changed Changed    Dressing Status None    Dressing Change Frequency PRN    Site / Wound Assessment Red;Yellow;Pink    % Wound base Red or Granulating 100%   following debridement   Drainage Amount None    Treatment Cleansed;Debridement (Selective)    Wound Properties Date First Assessed: 12/13/20 Time First Assessed: 0400 Location: Pretibial Location Orientation: Left;Proximal Wound Description (Comments): Lt medial knee inferior to initial knee blister   Dressing Type Compression wrap;Impregnated gauze (bismuth)    Dressing Changed Changed    Dressing Status Old drainage    Dressing Change Frequency PRN    Site / Wound Assessment Yellow;Red;Pink    % Wound base Red or Granulating 100%     Drainage Amount Minimal    Drainage Description Serous    Treatment Cleansed;Debridement (Selective)    Selective Debridement - Location B LE    Selective Debridement - Tools Used Forceps    Selective Debridement - Tissue Removed devitalized tissue    Wound Therapy - Clinical Statement Both superior medial knee wounds were uncovered with noted crusting present. Once removed, noted area was unhealed beneath.  Cleansed well and applied lotion then vaseline to dry skin periemter.  Continued with profore lite as wounds are still not healed and is unable to don stockings at this time.    Wound Therapy - Functional Problem List walking, bathing, dressing    Factors Delaying/Impairing Wound Healing Multiple medical problems    Wound Therapy - Frequency 2X / week    Wound Therapy - Current Recommendations PT    Wound Plan debride as indicated, redress.  measure weekly (thursday or friday) begin training with stockings once wounds are healed.    Dressing  lotion then Vaseline to B LE, xeroform to remaining medial knee wounds,Profore lite B with extra cotton needed B to ensure conial shape.                       PT Short Term Goals - 12/15/20 1431       PT SHORT TERM GOAL #1   Title wounds on right will be 100% granulated    Time 3    Period Weeks    Status On-going    Target Date 12/01/20      PT SHORT TERM GOAL #2   Title wound on left will be 100% granulated    Time 3    Period Weeks    Status On-going    Target Date 12/01/20      PT SHORT TERM GOAL #3   Title Patient will have < 3 wounds on right leg    Baseline 4 - one new    Time 3    Period Weeks    Status On-going    Target Date 12/01/20               PT Long Term Goals - 12/15/20 1431       PT LONG TERM GOAL #1   Title wounds on right will be completely healed    Time 6    Period Weeks    Status Not Met    Target Date 12/22/20      PT LONG TERM GOAL #2   Title wounds on left will be completely  healed    Time 6    Period Weeks    Status On-going  Target Date 12/22/20      PT LONG TERM GOAL #3   Title Patietn will report importance of wearing compression garments daily    Time 6    Period Weeks    Status On-going    Target Date 12/22/20                    Patient will benefit from skilled therapeutic intervention in order to improve the following deficits and impairments:     Visit Diagnosis: Wound of left lower extremity, initial encounter  Difficulty in walking, not elsewhere classified  Multiple open wounds of right lower leg     Problem List Patient Active Problem List   Diagnosis Date Noted   Acute on chronic systolic CHF (congestive heart failure) ([place]) 10/28/2020   Noncompliance 12/16/2017   Elevated troponin 12/16/2017   Bronchitis 12/16/2017   COPD with acute exacerbation ([place]) 12/15/2017   Lipoma of extremity    Secondary cardiomyopathy ([place]) 10/14/2012   Atypical chest pain 10/13/2012   Chest pain 10/12/2012   Abnormal EKG 10/12/2012   GERD (gastroesophageal reflux disease) 10/12/2012   UNSPECIFIED ANEMIA 05/18/2008   ERECTILE DYSFUNCTION 09/25/2007   [facility] PULMONARY TB-CULT DX 06/19/2007   UNDERWEIGHT 06/19/2007   OSTEOARTHRITIS, HIP, RIGHT 02/18/2006   HEPATITIS C 01/08/2006   DISORDER, TOBACCO USE 01/08/2006   MIGRAINE HEADACHE 01/08/2006   ALLERGIC RHINITIS 01/08/2006   GERD 01/08/2006   BPH (benign prostatic hyperplasia) 01/08/2006   OSTEOARTHRITIS 01/08/2006   Celso Granja [place] S[place], PTA/CLT, WTA [phone]  Teena Irani, PTA 01/09/2021, 3:21 PM  [place] [address] [place], [place], [address] Phone: [phone]   Fax:  [phone]  Name: James Cervantes MRN: [id number] Date of Birth: [birth year]

## 2021-01-10 ENCOUNTER — Ambulatory Visit (HOSPITAL_COMMUNITY): Payer: Medicare Other | Admitting: Physical Therapy

## 2021-01-11 ENCOUNTER — Encounter: Payer: Self-pay | Admitting: Gastroenterology

## 2021-01-12 ENCOUNTER — Ambulatory Visit (HOSPITAL_COMMUNITY): Payer: Medicare Other | Admitting: Physical Therapy

## 2021-01-17 ENCOUNTER — Ambulatory Visit (HOSPITAL_COMMUNITY): Payer: Medicare Other | Admitting: Physical Therapy

## 2021-01-17 ENCOUNTER — Telehealth (HOSPITAL_COMMUNITY): Payer: Self-pay | Admitting: Physical Therapy

## 2021-01-17 NOTE — Telephone Encounter (Signed)
Pt no showed for the 8th time.  Unable to leave message for pt.  As this is a wound we will not discharge but please print appointment next treatment and give to pt stressing the importance of coming to therapy if he wants the wounds to heal.  Rayetta Humphrey, [place] CLT [phone]

## 2021-01-19 ENCOUNTER — Ambulatory Visit (HOSPITAL_COMMUNITY): Payer: Medicare Other | Admitting: Physical Therapy

## 2021-01-19 ENCOUNTER — Telehealth (HOSPITAL_COMMUNITY): Payer: Self-pay | Admitting: Physical Therapy

## 2021-01-23 ENCOUNTER — Ambulatory Visit (HOSPITAL_COMMUNITY): Payer: Medicare Other | Admitting: Physical Therapy

## 2021-01-26 ENCOUNTER — Ambulatory Visit (HOSPITAL_COMMUNITY): Payer: Medicare Other | Admitting: Physical Therapy

## 2021-01-30 ENCOUNTER — Telehealth: Payer: Self-pay | Admitting: Cardiology

## 2021-01-30 NOTE — Telephone Encounter (Signed)
[facility] [employer] [facility] called for Dr. Domenic Polite to sign the death certificate for this patient in the [place] system, ID # [id number].

## 2021-01-31 NOTE — Telephone Encounter (Signed)
Funeral call back to give  1.DOD: 2021-01-27 8:34pm 2. And No autopsy requested   Will have the EMS to call back to get the manner of death

## 2021-01-31 NOTE — Telephone Encounter (Signed)
[facility]eral Home states he spoke with a Mr.Stevens with EMS and that he was told EMS would call within the hour.

## 2021-02-01 NOTE — Telephone Encounter (Signed)
Pt currently with 3 consecutive NS's, 9 total NS's, failure to return to clinic since 01/09/21.  Therapist has left messages with residents at [facility] contact info without return call or known contact of patient.  The patient's number is disconnected.  Spoke with nieces husband today and informed that patient would now be discharged from therapy per NS policy.  Teena Irani, PTA/CLT, [place] Morales [phone]

## 2021-02-01 NOTE — Telephone Encounter (Signed)
Reached out to [facility] and was told someone would call back by the end of day today with information regarding manner of death for pt

## 2021-02-01 DEATH — deceased

## 2021-02-03 NOTE — Telephone Encounter (Addendum)
I spoke with T.Goins, EMT-P, she is investigating and will call back with update.Funeral home updated.

## 2021-02-07 NOTE — Telephone Encounter (Signed)
I spoke with Roderic Palau, EMT-P, [facility]. He states when EMS arrived on seen patient was already deceased, had riga mortis and they noted extremely swollen legs. A friend told EMS he had recent "breathing problems" and they thought he had Covid . Law enforcement on scene noted no signs of foul play but did note poor living conditions in the home.Mr.Stewart felt that his non-compliance of care and CHF were probable causative factors.

## 2021-02-08 NOTE — Telephone Encounter (Signed)
[facility] has not placed [place] [facility] info in system.They states the person who will enter will be back later today 2/8.

## 2021-02-09 NOTE — Telephone Encounter (Signed)
[facility] Home called back to say they have an ID number now for the patient's death certificate

## 2021-02-10 NOTE — Telephone Encounter (Addendum)
I called [facility] again.They did not return the call from this am. I spoke with larry. He states the death certificate was entered in [place] and I told him that physician could not see it. They will go to the [place] office next week after morning clinic and have Dr.McDowell sign the death.

## 2021-02-10 NOTE — Telephone Encounter (Signed)
I called [facility]. Renee answered the phone and stated she was answering for the funeral home and would have someone call me back asap.

## 2021-02-15 NOTE — Telephone Encounter (Signed)
Fritz Pickerel from [place] [facility] called with [place] number [phone]     I will secure chat Dr.McDowell.

## 2021-02-15 NOTE — Telephone Encounter (Signed)
Death certificate located in [place] and certified today 07/15/8061.

## 2021-02-15 NOTE — Telephone Encounter (Signed)
I called funeral home at [place] and was told by answering service they opened at 0900 but were not answering phones. She will send them a direct message.     I received message from director from the [place] location and he himself did not see anything in [place] and was going to speak directly with the [place] location and have some one call me back.

## 2021-03-08 ENCOUNTER — Ambulatory Visit: Payer: Medicare Other | Admitting: Student

## 2021-04-12 ENCOUNTER — Ambulatory Visit: Payer: Medicare Other | Admitting: Gastroenterology

## 2021-04-18 ENCOUNTER — Ambulatory Visit: Payer: Medicaid Other | Admitting: Gastroenterology

## 2021-09-17 IMAGING — DX DG CHEST 1V PORT
1 series · 1 of 1 positions shown · non-contrast
Comparison: 03/14/2020

CLINICAL DATA: Shortness of breath

EXAM:
PORTABLE CHEST 1 VIEW

[chest ap]
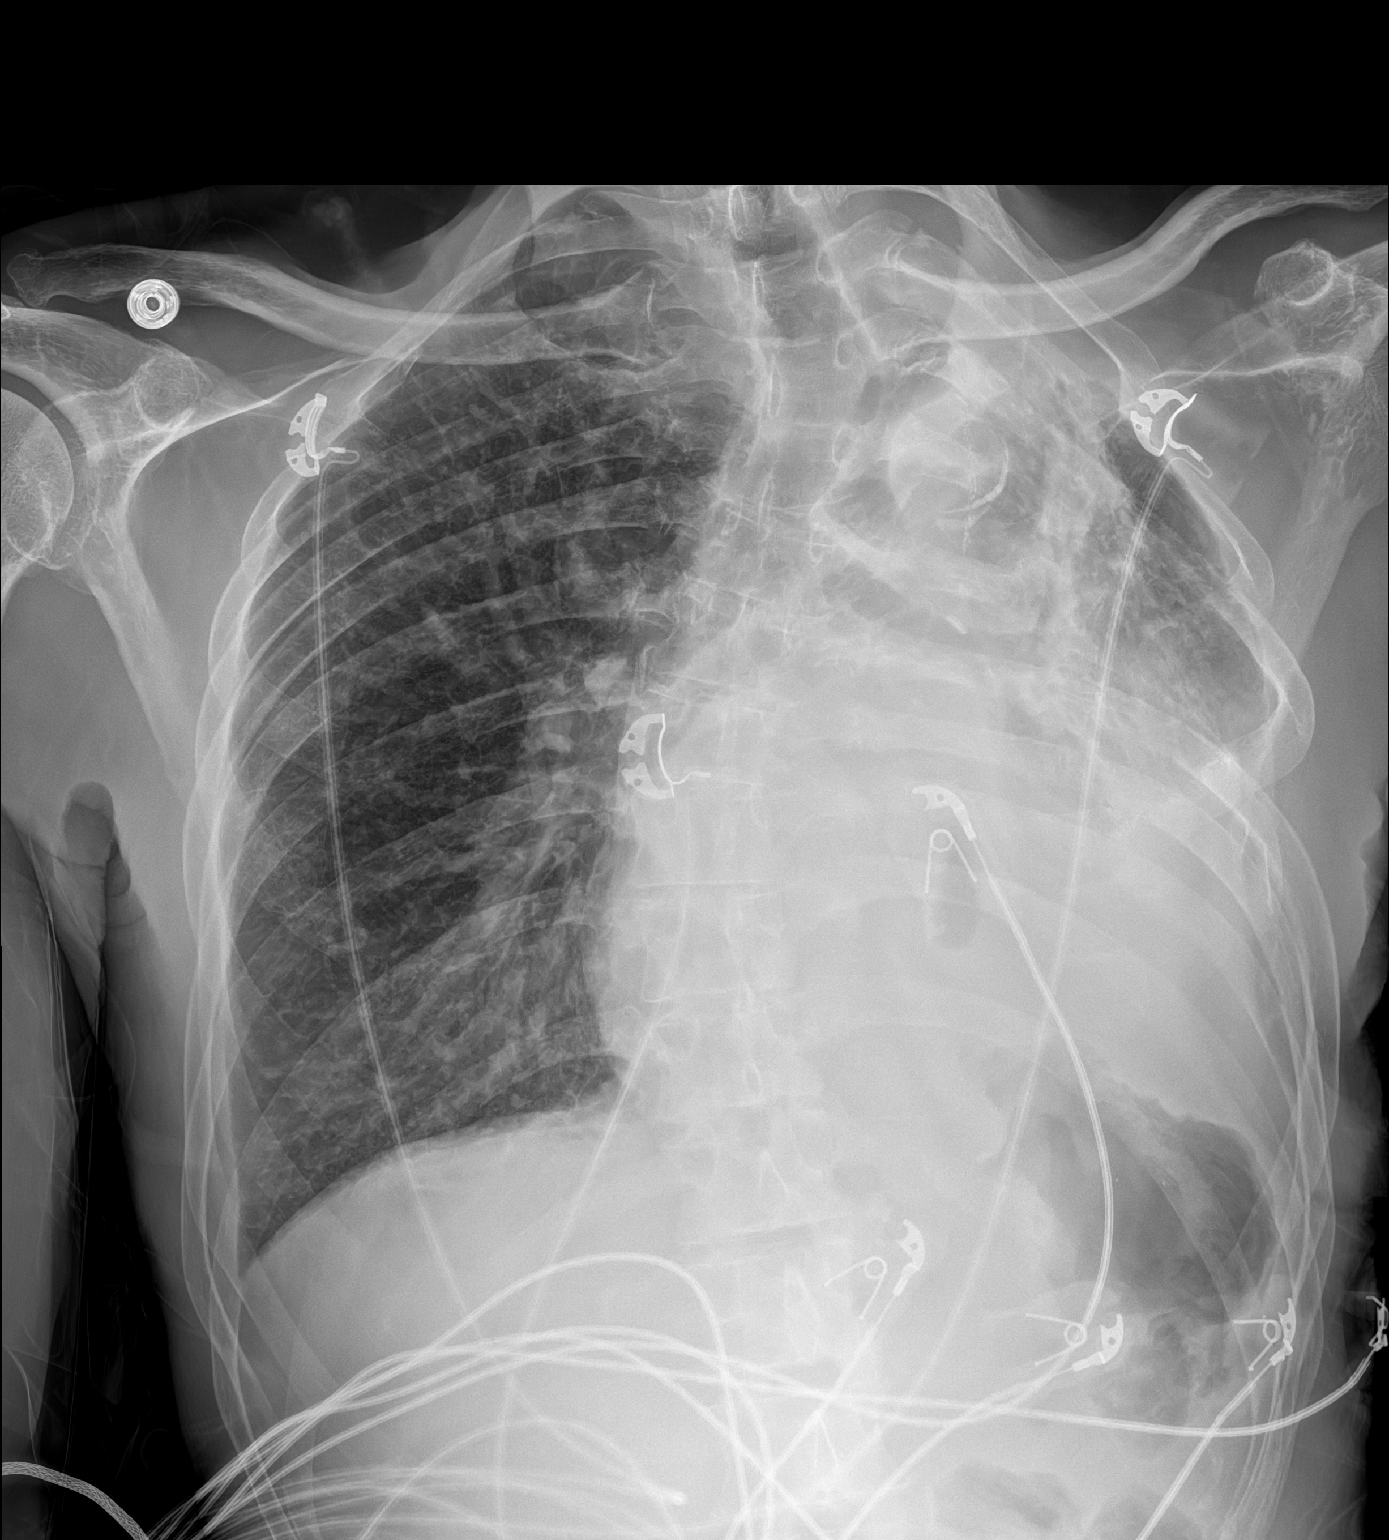

[1 of 1 positions shown; findings below may reference images not displayed]

FINDINGS: Right lung remains well aerated without focal infiltrate. Chronic
postsurgical changes are again seen on the left with volume loss.
These changes are stable from the prior exam. Stable scoliosis is
noted.
IMPRESSION: No acute abnormality noted. Chronic changes are again seen on the
left.

## 2021-09-17 IMAGING — US US SCROTUM
1 series · 14 of 25 positions shown · non-contrast
Comparison: None.

CLINICAL DATA: Scrotal edema

EXAM:
ULTRASOUND OF SCROTUM
TECHNIQUE: Complete ultrasound examination of the testicles, epididymis, and
other scrotal structures was performed.

[Series 1: us scrotum · 14 of 58 slices shown]
[im 1/58]
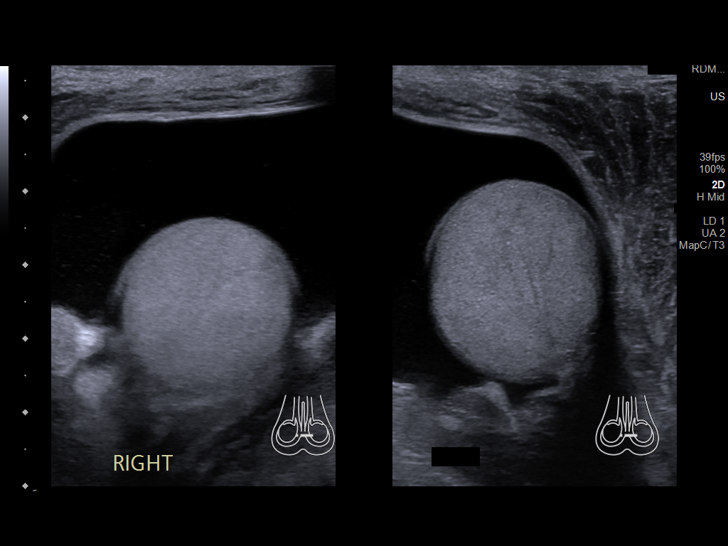
[im 5/58]
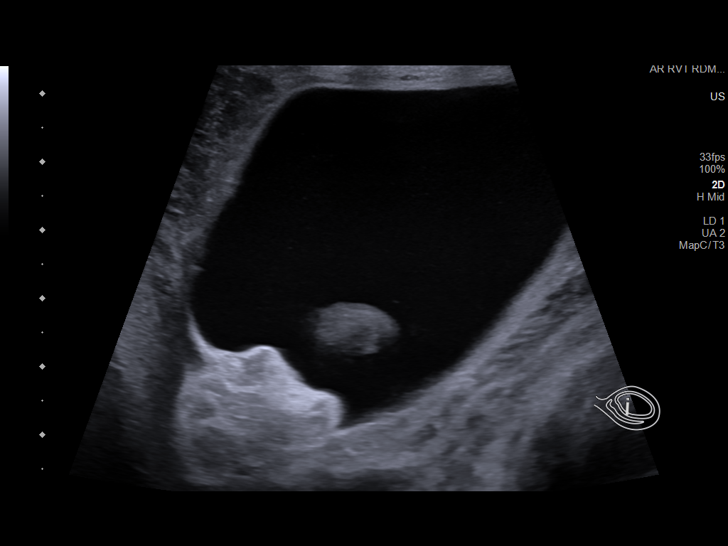
[im 10/58]
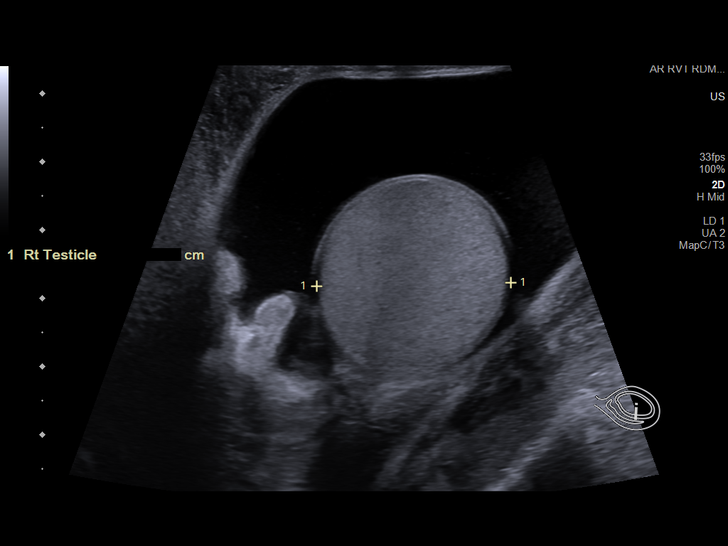
[im 15/58]
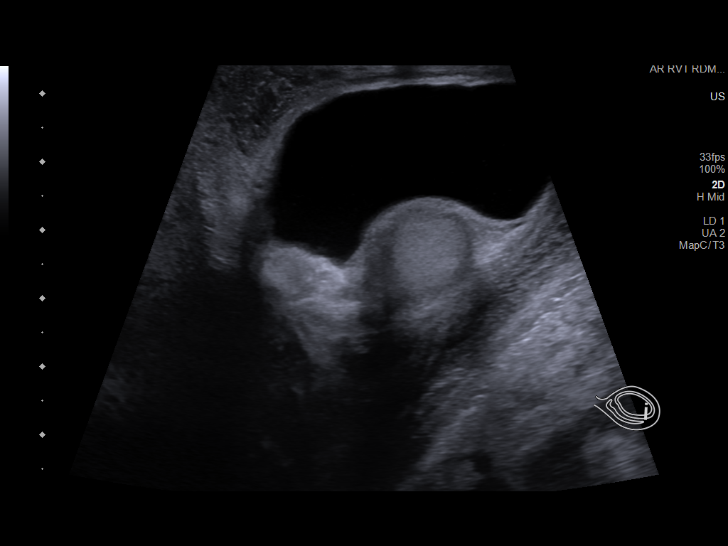
[im 20/58]
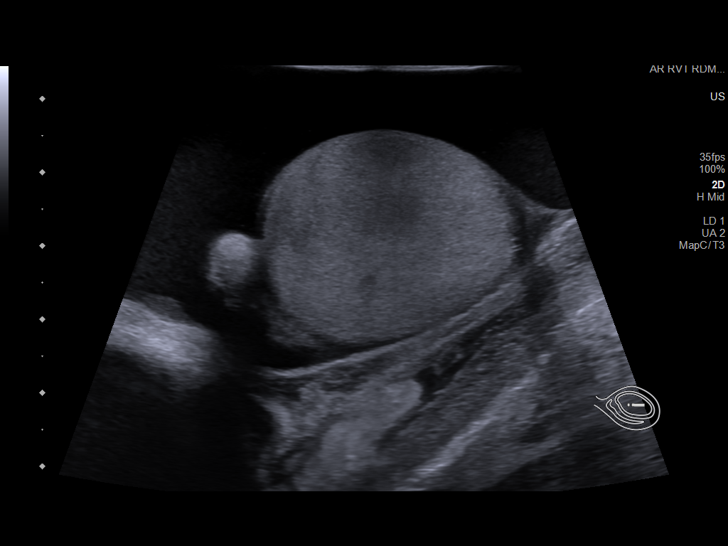
[im 22/58]
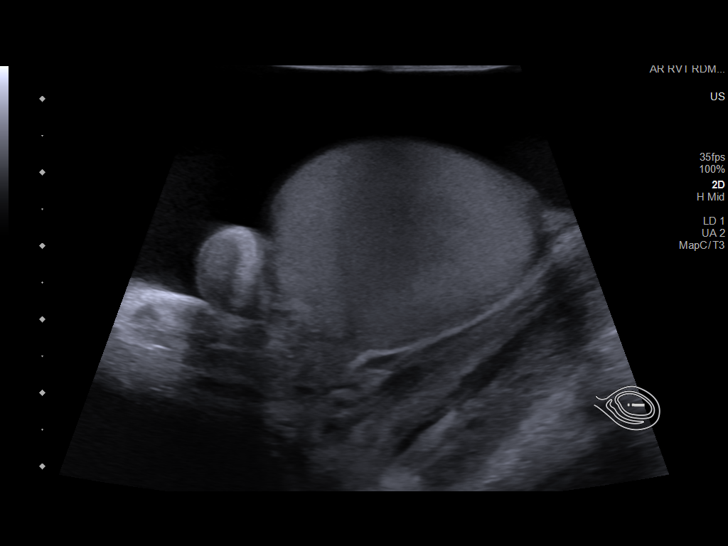
[im 27/58]
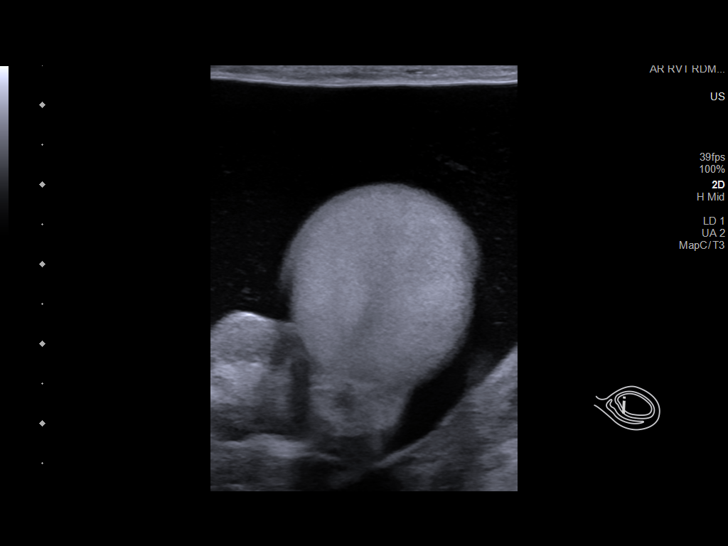
[im 31/58]
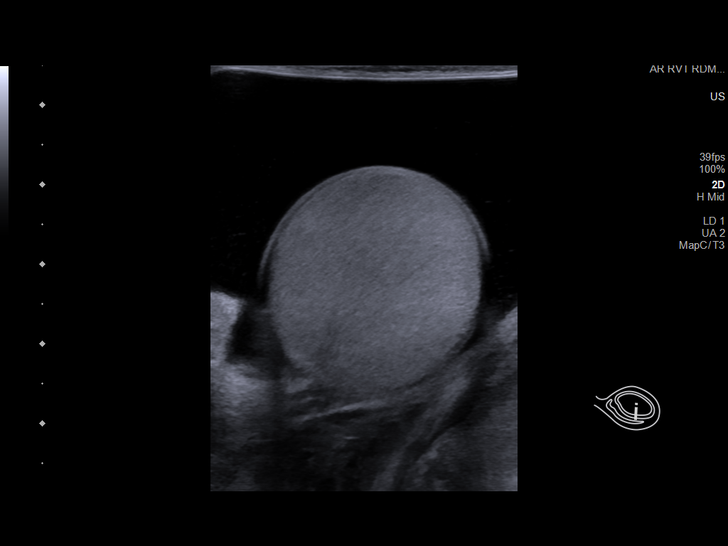
[im 36/58]
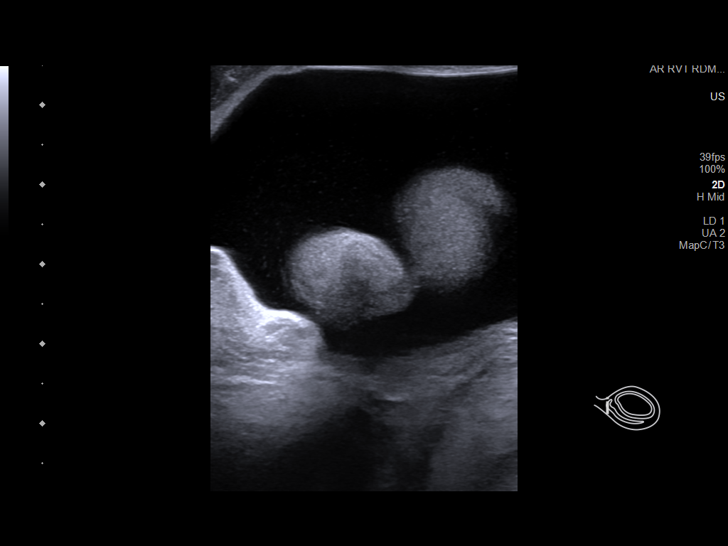
[im 39/58]
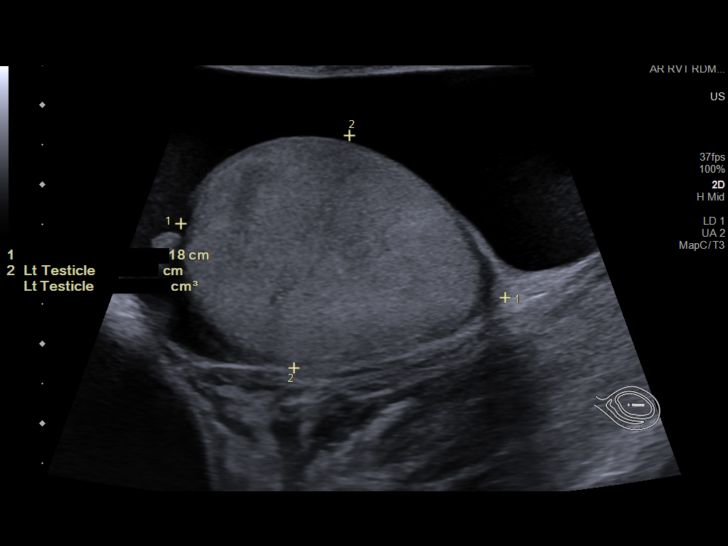
[im 43/58]
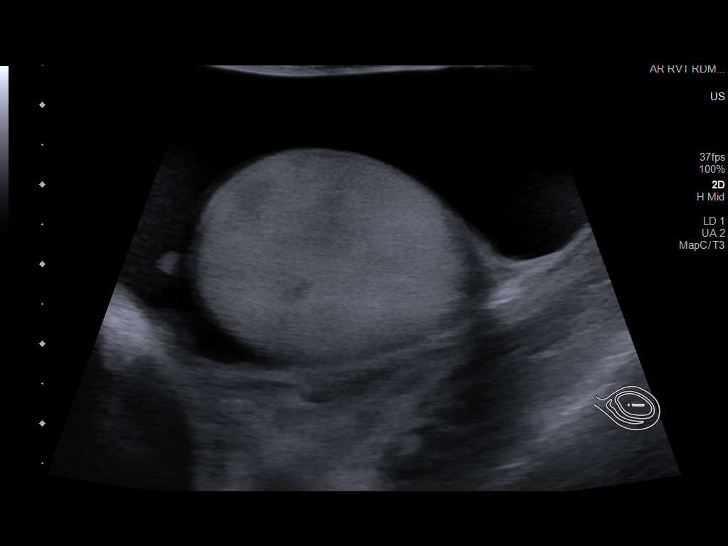
[im 48/58]
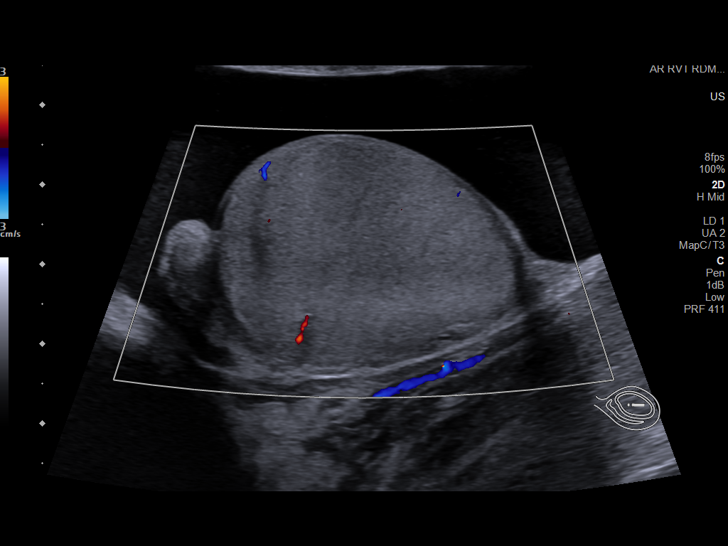
[im 53/58]
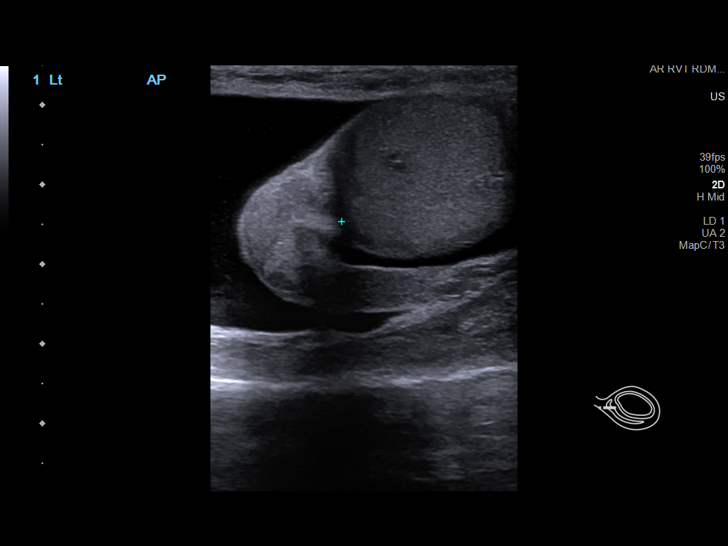
[im 58/58]
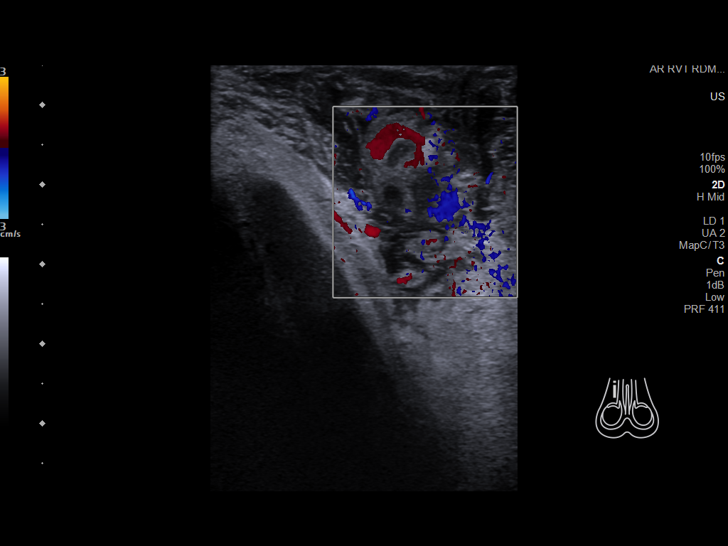

[14 of 25 positions shown; findings below may reference images not displayed]

FINDINGS: Right testicle

Measurements: 4.0 x 2.9 x 2.8 cm. No mass or microlithiasis
visualized.

Left testicle

Measurements: 4.2 x 3.0 x 2.7 cm. No mass or microlithiasis
visualized.

Right epididymis:  Normal in size and appearance.

Left epididymis:  Normal in size and appearance.

Hydrocele:  Moderate to large bilateral hydroceles.

Varicocele:  None visualized.
IMPRESSION: 1. Moderate to large bilateral hydroceles.
2. Otherwise, normal scrotal ultrasound.
